# Patient Record
Sex: Female | Born: 1950
Health system: Southern US, Community
[De-identification: ages and names within clinical notes are randomized; demographics above are authoritative.]

## PROBLEM LIST (undated history)

## (undated) DIAGNOSIS — K219 Gastro-esophageal reflux disease without esophagitis: Secondary | ICD-10-CM

## (undated) DIAGNOSIS — G4730 Sleep apnea, unspecified: Secondary | ICD-10-CM

## (undated) DIAGNOSIS — I10 Essential (primary) hypertension: Secondary | ICD-10-CM

## (undated) HISTORY — DX: Essential (primary) hypertension: I10

## (undated) HISTORY — PX: BREAST BIOPSY: SHX20

---

## 1998-03-20 ENCOUNTER — Encounter: Admission: RE | Admit: 1998-03-20 | Discharge: 1998-03-20 | Payer: Self-pay | Admitting: Family Medicine

## 1999-11-18 ENCOUNTER — Encounter: Payer: Self-pay | Admitting: *Deleted

## 1999-11-18 ENCOUNTER — Encounter: Admission: RE | Admit: 1999-11-18 | Discharge: 1999-11-18 | Payer: Self-pay | Admitting: Sports Medicine

## 1999-12-10 ENCOUNTER — Other Ambulatory Visit: Admission: RE | Admit: 1999-12-10 | Discharge: 1999-12-10 | Payer: Self-pay | Admitting: General Surgery

## 2000-01-12 ENCOUNTER — Ambulatory Visit (HOSPITAL_BASED_OUTPATIENT_CLINIC_OR_DEPARTMENT_OTHER): Admission: RE | Admit: 2000-01-12 | Discharge: 2000-01-12 | Payer: Self-pay | Admitting: General Surgery

## 2000-01-12 ENCOUNTER — Encounter (INDEPENDENT_AMBULATORY_CARE_PROVIDER_SITE_OTHER): Payer: Self-pay | Admitting: *Deleted

## 2003-01-22 ENCOUNTER — Encounter: Payer: Self-pay | Admitting: Cardiovascular Disease

## 2003-01-22 ENCOUNTER — Inpatient Hospital Stay (HOSPITAL_COMMUNITY): Admission: EM | Admit: 2003-01-22 | Discharge: 2003-01-23 | Payer: Self-pay | Admitting: Nurse Practitioner

## 2004-09-16 ENCOUNTER — Encounter: Admission: RE | Admit: 2004-09-16 | Discharge: 2004-09-16 | Payer: Self-pay | Admitting: Allergy and Immunology

## 2005-03-22 ENCOUNTER — Other Ambulatory Visit: Admission: RE | Admit: 2005-03-22 | Discharge: 2005-03-22 | Payer: Self-pay | Admitting: Obstetrics and Gynecology

## 2008-05-30 ENCOUNTER — Observation Stay (HOSPITAL_COMMUNITY): Admission: EM | Admit: 2008-05-30 | Discharge: 2008-05-31 | Payer: Self-pay | Admitting: Emergency Medicine

## 2008-05-30 ENCOUNTER — Ambulatory Visit: Payer: Self-pay | Admitting: Cardiology

## 2008-05-30 DIAGNOSIS — Z8679 Personal history of other diseases of the circulatory system: Secondary | ICD-10-CM | POA: Insufficient documentation

## 2008-05-31 ENCOUNTER — Encounter (INDEPENDENT_AMBULATORY_CARE_PROVIDER_SITE_OTHER): Payer: Self-pay | Admitting: Internal Medicine

## 2008-06-05 ENCOUNTER — Encounter (INDEPENDENT_AMBULATORY_CARE_PROVIDER_SITE_OTHER): Payer: Self-pay | Admitting: Internal Medicine

## 2008-06-05 DIAGNOSIS — Z87448 Personal history of other diseases of urinary system: Secondary | ICD-10-CM | POA: Insufficient documentation

## 2008-06-05 DIAGNOSIS — K219 Gastro-esophageal reflux disease without esophagitis: Secondary | ICD-10-CM

## 2010-12-11 ENCOUNTER — Inpatient Hospital Stay (HOSPITAL_COMMUNITY)
Admission: EM | Admit: 2010-12-11 | Discharge: 2010-12-16 | Payer: Self-pay | Source: Home / Self Care | Attending: Internal Medicine | Admitting: Internal Medicine

## 2010-12-14 DIAGNOSIS — F329 Major depressive disorder, single episode, unspecified: Secondary | ICD-10-CM

## 2010-12-14 DIAGNOSIS — F3289 Other specified depressive episodes: Secondary | ICD-10-CM

## 2010-12-21 LAB — URINALYSIS, ROUTINE W REFLEX MICROSCOPIC
Ketones, ur: NEGATIVE mg/dL
Leukocytes, UA: NEGATIVE
Nitrite: NEGATIVE
Protein, ur: 30 mg/dL — AB
Specific Gravity, Urine: 1.02 (ref 1.005–1.030)
Urine Glucose, Fasting: NEGATIVE mg/dL
Urobilinogen, UA: 1 mg/dL (ref 0.0–1.0)
pH: 6 (ref 5.0–8.0)

## 2010-12-21 LAB — CBC
HCT: 43.9 % (ref 36.0–46.0)
HCT: 40.9 % (ref 36.0–46.0)
HCT: 41.5 % (ref 36.0–46.0)
HCT: 41.6 % (ref 36.0–46.0)
HCT: 43.6 % (ref 36.0–46.0)
Hemoglobin: 14.6 g/dL (ref 12.0–15.0)
Hemoglobin: 13.3 g/dL (ref 12.0–15.0)
Hemoglobin: 13.5 g/dL (ref 12.0–15.0)
Hemoglobin: 13.2 g/dL (ref 12.0–15.0)
Hemoglobin: 14.2 g/dL (ref 12.0–15.0)
MCH: 30.9 pg (ref 26.0–34.0)
MCH: 30.4 pg (ref 26.0–34.0)
MCH: 30.3 pg (ref 26.0–34.0)
MCH: 29.7 pg (ref 26.0–34.0)
MCH: 30.5 pg (ref 26.0–34.0)
MCHC: 33.3 g/dL (ref 30.0–36.0)
MCHC: 32.5 g/dL (ref 30.0–36.0)
MCHC: 32.5 g/dL (ref 30.0–36.0)
MCHC: 31.7 g/dL (ref 30.0–36.0)
MCHC: 32.6 g/dL (ref 30.0–36.0)
MCV: 93 fL (ref 78.0–100.0)
MCV: 93.4 fL (ref 78.0–100.0)
MCV: 93 fL (ref 78.0–100.0)
MCV: 93.5 fL (ref 78.0–100.0)
MCV: 93.8 fL (ref 78.0–100.0)
Platelets: 242 10*3/uL (ref 150–400)
Platelets: 230 10*3/uL (ref 150–400)
Platelets: 272 10*3/uL (ref 150–400)
Platelets: 287 10*3/uL (ref 150–400)
Platelets: 321 10*3/uL (ref 150–400)
RBC: 4.72 MIL/uL (ref 3.87–5.11)
RBC: 4.38 MIL/uL (ref 3.87–5.11)
RBC: 4.46 MIL/uL (ref 3.87–5.11)
RBC: 4.45 MIL/uL (ref 3.87–5.11)
RBC: 4.65 MIL/uL (ref 3.87–5.11)
RDW: 13.9 % (ref 11.5–15.5)
RDW: 14 % (ref 11.5–15.5)
RDW: 13.7 % (ref 11.5–15.5)
RDW: 13.9 % (ref 11.5–15.5)
RDW: 13.6 % (ref 11.5–15.5)
WBC: 13.5 10*3/uL — ABNORMAL HIGH (ref 4.0–10.5)
WBC: 9.9 10*3/uL (ref 4.0–10.5)
WBC: 13.5 10*3/uL — ABNORMAL HIGH (ref 4.0–10.5)
WBC: 15 10*3/uL — ABNORMAL HIGH (ref 4.0–10.5)
WBC: 18.1 10*3/uL — ABNORMAL HIGH (ref 4.0–10.5)

## 2010-12-21 LAB — BASIC METABOLIC PANEL
BUN: 13 mg/dL (ref 6–23)
CO2: 30 mEq/L (ref 19–32)
Calcium: 8.7 mg/dL (ref 8.4–10.5)
Chloride: 98 mEq/L (ref 96–112)
Creatinine, Ser: 0.85 mg/dL (ref 0.4–1.2)
GFR calc Af Amer: >60 mL/min (ref >60)
GFR calc non Af Amer: >60 mL/min (ref >60)
Glucose, Bld: 128 mg/dL — ABNORMAL HIGH (ref 70–99)
Potassium: 4 mEq/L (ref 3.5–5.1)
Sodium: 140 mEq/L (ref 135–145)

## 2010-12-21 LAB — CK TOTAL AND CKMB (NOT AT ARMC)
CK, MB: 3.8 ng/mL (ref 0.3–4.0)
Relative Index: 0.7 (ref 0.0–2.5)
Total CK: 551 U/L — ABNORMAL HIGH (ref 7–177)

## 2010-12-21 LAB — URINE CULTURE
Colony Count: >=100000
Colony Count: NO GROWTH
Culture  Setup Time: 201201071117
Culture  Setup Time: 201201090012
Culture: NO GROWTH

## 2010-12-21 LAB — CARDIAC PANEL(CRET KIN+CKTOT+MB+TROPI)
CK, MB: 4.4 ng/mL — ABNORMAL HIGH (ref 0.3–4.0)
CK, MB: 3.9 ng/mL (ref 0.3–4.0)
CK, MB: 4.2 ng/mL — ABNORMAL HIGH (ref 0.3–4.0)
CK, MB: 4.8 ng/mL — ABNORMAL HIGH (ref 0.3–4.0)
CK, MB: 4.6 ng/mL — ABNORMAL HIGH (ref 0.3–4.0)
CK, MB: 4.8 ng/mL — ABNORMAL HIGH (ref 0.3–4.0)
CK, MB: 5 ng/mL — ABNORMAL HIGH (ref 0.3–4.0)
CK, MB: 4.3 ng/mL — ABNORMAL HIGH (ref 0.3–4.0)
Relative Index: 0.7 (ref 0.0–2.5)
Relative Index: 0.7 (ref 0.0–2.5)
Relative Index: 1 (ref 0.0–2.5)
Relative Index: 1.4 (ref 0.0–2.5)
Relative Index: 1.5 (ref 0.0–2.5)
Relative Index: 2 (ref 0.0–2.5)
Relative Index: 2.3 (ref 0.0–2.5)
Relative Index: 2.7 — ABNORMAL HIGH (ref 0.0–2.5)
Total CK: 632 U/L — ABNORMAL HIGH (ref 7–177)
Total CK: 541 U/L — ABNORMAL HIGH (ref 7–177)
Total CK: 440 U/L — ABNORMAL HIGH (ref 7–177)
Total CK: 351 U/L — ABNORMAL HIGH (ref 7–177)
Total CK: 307 U/L — ABNORMAL HIGH (ref 7–177)
Total CK: 236 U/L — ABNORMAL HIGH (ref 7–177)
Total CK: 218 U/L — ABNORMAL HIGH (ref 7–177)
Total CK: 159 U/L (ref 7–177)
Troponin I: 0.03 ng/mL (ref 0.00–0.06)
Troponin I: 0.01 ng/mL (ref 0.00–0.06)
Troponin I: 0.02 ng/mL (ref 0.00–0.06)
Troponin I: 0.02 ng/mL (ref 0.00–0.06)
Troponin I: 0.02 ng/mL (ref 0.00–0.06)
Troponin I: 0.03 ng/mL (ref 0.00–0.06)
Troponin I: 0.02 ng/mL (ref 0.00–0.06)
Troponin I: 0.01 ng/mL (ref 0.00–0.06)

## 2010-12-21 LAB — POCT I-STAT, CHEM 8
BUN: 16 mg/dL (ref 6–23)
Calcium, Ion: 0.86 mmol/L — ABNORMAL LOW (ref 1.12–1.32)
Chloride: 104 mEq/L (ref 96–112)
Creatinine, Ser: 1.4 mg/dL — ABNORMAL HIGH (ref 0.4–1.2)
Glucose, Bld: 118 mg/dL — ABNORMAL HIGH (ref 70–99)
HCT: 47 % — ABNORMAL HIGH (ref 36.0–46.0)
Hemoglobin: 16 g/dL — ABNORMAL HIGH (ref 12.0–15.0)
Potassium: 3.7 mEq/L (ref 3.5–5.1)
Sodium: 137 mEq/L (ref 135–145)
TCO2: 27 mmol/L (ref 0–100)

## 2010-12-21 LAB — COMPREHENSIVE METABOLIC PANEL
ALT: 52 U/L — ABNORMAL HIGH (ref 0–35)
AST: 45 U/L — ABNORMAL HIGH (ref 0–37)
Albumin: 3.3 g/dL — ABNORMAL LOW (ref 3.5–5.2)
Alkaline Phosphatase: 50 U/L (ref 39–117)
BUN: 12 mg/dL (ref 6–23)
CO2: 27 mEq/L (ref 19–32)
Calcium: 8.8 mg/dL (ref 8.4–10.5)
Chloride: 102 mEq/L (ref 96–112)
Creatinine, Ser: 0.91 mg/dL (ref 0.4–1.2)
GFR calc Af Amer: >60 mL/min (ref >60)
GFR calc non Af Amer: >60 mL/min (ref >60)
Glucose, Bld: 147 mg/dL — ABNORMAL HIGH (ref 70–99)
Potassium: 3.7 mEq/L (ref 3.5–5.1)
Sodium: 140 mEq/L (ref 135–145)
Total Bilirubin: 0.8 mg/dL (ref 0.3–1.2)
Total Protein: 7.5 g/dL (ref 6.0–8.3)

## 2010-12-21 LAB — TSH
TSH: 6.279 u[IU]/mL — ABNORMAL HIGH (ref 0.350–4.500)
TSH: 0.457 u[IU]/mL (ref 0.350–4.500)

## 2010-12-21 LAB — DIFFERENTIAL
Basophils Absolute: 0 10*3/uL (ref 0.0–0.1)
Basophils Absolute: 0 10*3/uL (ref 0.0–0.1)
Basophils Relative: 0 % (ref 0–1)
Basophils Relative: 0 % (ref 0–1)
Eosinophils Absolute: 0 10*3/uL (ref 0.0–0.7)
Eosinophils Absolute: 0 10*3/uL (ref 0.0–0.7)
Eosinophils Relative: 0 % (ref 0–5)
Eosinophils Relative: 0 % (ref 0–5)
Lymphocytes Relative: 15 % (ref 12–46)
Lymphocytes Relative: 16 % (ref 12–46)
Lymphs Abs: 2 10*3/uL (ref 0.7–4.0)
Lymphs Abs: 1.6 10*3/uL (ref 0.7–4.0)
Monocytes Absolute: 1.3 10*3/uL — ABNORMAL HIGH (ref 0.1–1.0)
Monocytes Absolute: 1 10*3/uL (ref 0.1–1.0)
Monocytes Relative: 10 % (ref 3–12)
Monocytes Relative: 10 % (ref 3–12)
Neutro Abs: 10.1 10*3/uL — ABNORMAL HIGH (ref 1.7–7.7)
Neutro Abs: 7.3 10*3/uL (ref 1.7–7.7)
Neutrophils Relative %: 75 % (ref 43–77)
Neutrophils Relative %: 74 % (ref 43–77)

## 2010-12-21 LAB — LIPID PANEL
Cholesterol: 198 mg/dL (ref 0–200)
HDL: 41 mg/dL (ref >39)
LDL Cholesterol: 137 mg/dL — ABNORMAL HIGH (ref 0–99)
Total CHOL/HDL Ratio: 4.8 RATIO
Triglycerides: 98 mg/dL (ref <150)
VLDL: 20 mg/dL (ref 0–40)

## 2010-12-21 LAB — URINE MICROSCOPIC-ADD ON

## 2010-12-21 LAB — CULTURE, BLOOD (ROUTINE X 2)
Culture  Setup Time: 201201071112
Culture  Setup Time: 201201071114
Culture: NO GROWTH
Culture: NO GROWTH

## 2010-12-21 LAB — TROPONIN I: Troponin I: 0.03 ng/mL (ref 0.00–0.06)

## 2010-12-27 ENCOUNTER — Encounter: Payer: Self-pay | Admitting: Obstetrics and Gynecology

## 2011-03-19 ENCOUNTER — Other Ambulatory Visit (HOSPITAL_COMMUNITY): Payer: Self-pay | Admitting: Family Medicine

## 2011-03-19 ENCOUNTER — Ambulatory Visit (HOSPITAL_COMMUNITY)
Admission: RE | Admit: 2011-03-19 | Discharge: 2011-03-19 | Disposition: A | Discharge status: Home / Self Care | Payer: Medicaid Other | Source: Ambulatory Visit | Attending: Family Medicine | Admitting: Family Medicine

## 2011-03-19 DIAGNOSIS — R059 Cough, unspecified: Secondary | ICD-10-CM | POA: Insufficient documentation

## 2011-03-19 DIAGNOSIS — R0602 Shortness of breath: Secondary | ICD-10-CM

## 2011-03-19 DIAGNOSIS — R05 Cough: Secondary | ICD-10-CM | POA: Insufficient documentation

## 2011-03-19 DIAGNOSIS — I10 Essential (primary) hypertension: Secondary | ICD-10-CM | POA: Insufficient documentation

## 2011-04-20 NOTE — Discharge Summary (Signed)
Pamela Soto, Pamela Soto                ACCOUNT NO.:  [id number]   MEDICAL RECORD NO.:  [id number]          PATIENT TYPE:  OBV   LOCATION:  4703                         FACILITY:  [facility]   PHYSICIAN:  Mick Sell, MD DATE OF BIRTH:  Aug 21, 1951   DATE OF ADMISSION:  05/30/2008  DATE OF DISCHARGE:  05/31/2008                               DISCHARGE SUMMARY   DISCHARGE DIAGNOSES:  1. Chest pain with typical and atypical features ruled out for      myocardial infarction by negative cardiac enzymes and unchanged      EKGs, but with mildly abnormal 2-D echo (normal cardiac cath with      Dr. Jacinto Halim in 2004).  2. History of gastroesophageal reflux disease.  3. History of breast mass excised in 2001 (benign).  4. Urinary tract infection.  5. Trichomoniasis.   MEDICATIONS AT DISCHARGE:  1. Aspirin 325 mg p.o. daily x1 week and then 81 mg p.o. q. daily.  2. Nexium 20 mg p.o. b.i.d.  3. Ciprofloxacin 250 mg p.o. b.i.d. for 3 days.  4. Pravastatin 40 mg p.o. daily.  5. Hydrochlorothiazide 25 mg p.o. q. daily.   The patient will have an appointment with Dr. Jacinto Halim and he will call her  with the appointment date and time, and she will also have an  appointment with the outpatient clinic and the clinic will call her with  the appointment date. The [facility] appt could not be scheduled as the clinic  was closed on the D/C date.   She was advised to follow a low-sodium, heart-healthy diet.   CONDITION ON DISCHARGE:  Improved.  No chest pain.   PROCEDURES:  The patient had a chest x-ray on admission showing no  active disease, aorta mildly tortuous.  She had a 2-D echo showing question of hypokinesia at either the base of  the inferior wall or the base of the inferior septum.  Overall, left  ventricular systolic function normal with an EF of 55%-60%, left  ventricular wall thickness mildly increased.   There were no consultants.   HISTORY OF PRESENT ILLNESS:  This is a 60 year old African  American  woman with past medical history of GERD and noncardiac chest pain, with  a normal cath in 2004, presenting with a 2-day history of pressure-like  substernal chest pain, 9/10, nonradiating, associated with diaphoresis  and shortness of breath, but no nausea, vomiting, dizziness, or  palpitations.  She says pain partially relieved by Nexium, but this does  not feel like her regular GERD.  No relationship with food.  Pain better  when changes position, worsened by deep inspiration.  The patient  noticed increased blood pressure over the last days, 180-198/90-98.  The  pain started insidiously and is continuous.  It woke her up from sleep  the day prior to admission.  On the day of admission it was worse,  prompting her to come to the ED.  She mentions that she had this pain  for several months, on and off.  Normally, it appears on exertion and is  associated with shortness of breath and  relieved by Nexium.   She has no known drug allergies.   PHYSICAL EXAMINATION:  VITAL SIGNS:  Temperature 97.1, blood pressure  155/85, pulse 92 respiration 20, and oxygen saturation 96% on room air.  GENERAL:  She was in no acute distress.  Obese.  Dark complexion.  Chest  pain was 3/6 at the time of this exam.  EYES:  PERRL.  Extraocular movements intact.  ENT:  Clear oropharynx membranes.  Mallampati 4.  NECK:  Obese.  No thyromegaly.  No bruits.  RESPIRATORY:  Clear to auscultation bilaterally.  Good air movement.  CARDIOVASCULAR:  Regular rate and rhythm with the S2 more intense than  S1.  No murmurs, rubs, or gallops.  GI:  Soft and nontender to palpation in the suprapubic area,  nondistended.  Bowel sounds positive.  Negative Murphy.  EXTREMITIES:  No edema.  GU:  No CVA tenderness.  SKIN:  Moist.  MENTAL STATUS:  Alert and oriented x3.  NEURO:  Nonfocal.  PSYCH:  Appropriate.   LABS:  First set of point of care cardiac enzymes normal, except for a  troponin of 0.13; however, the  next set of point of care cardiac enzymes  showed a troponin of less than 0.05 and 3 subsequent regular sets were  normal.  EKG was completely normal.  White blood cells 7.2, hemoglobin  14.2, hematocrit 41.8, MCV 90.8, platelets 242,000, and neutrophil 72%.  i-STAT, sodium 140, potassium 3.6, CO2 31, chloride 104, glucose 115,  BUN 8, and creatinine 1.0.  Coags were normal.  D-dimer was normal at  0.31.  Lipase 27 and magnesium 2.2.  Creatinine on the CMET was 0.78.  All the liver function tests were normal.  ESR was 15.  Urinalysis  showed cloudy urine with large leukocytes.  Urine micro showed 11-20  white blood cells, 3-6 red blood cells, many bacteria, and few  Trichomonas.  Hemoglobin A1c was 6.0.  TSH was 3.171, free T4 was 1.02,  and C-reactive protein 1.8 mildly elevated.   ASSESSMENT AND PLAN:  This is a 60 year old African American woman with  several cardiac risk factors: hypertension, hyperlipidemia, history of  smoking, and questionable family history of myocardial infarction (in 22-  year-old in mother, during labor), presenting with chest pain with both  typical and atypical features.  1. Chest pain.      a.     This is concerning for a cardiac event in this obese woman       with the cardiac risk factors mentioned above.  She had a clean       cath in 2004 with Dr. Jacinto Halim, but mentions that this pain is       different.  Cardiac enzymes were negative, and 2 EKGs were       completely normal.  The 2-D echo showed some mild abnormality of       septal movement.  These 2-D echo changes were discussed with Dr.       Jacinto Halim who considered this to be very mild and nonspecific;       however, he would like to see the patient in outpatient for a       possible Myoview.      b.     We also considered possible diagnosis of pulmonary embolism       as the patient has a history of flight; however, this flight was       short and it was 2 months ago so unlikely to  be causing  symptoms       at this time.  She had no shortness of breath, no sustained       tachycardia, and actually a D-dimer was negative.      c.     We also considered pneumonia, but she had no symptoms, no       white blood cells, and a chest x-ray was clean.      d.     Pneumothorax and aortic dissection were practically excluded       based on the chest x-ray.      e.     Peptic ulcer disease was considered, too.  We ordered stool       H. pylori antigen on admission.  Gastritis is possible as well, in       the context of her using acetylsalicylic acid.  We also intended       to check a stool Hemoccult; however, neither of these 2 last tests       were able to be done since she did not provide a stool sample, but       this is a consideration for the future.      f.     We also considered gallbladder stone/inflammation; however,       the liver function tests were normal and she has no nausea or       vomiting or relationship of chest pain with food.  She had a       negative Murphy sign also.      g.     Musculoskeletal or thoracic spine nerve compression was also       possible.  An erythrocyte sedimentation rate was normal and her       chest pain was not reproduced by palpation.  At this point, we believe that while her heart could have contributed to  this chest pain, another etiology is still possible likely  gastrointestinal secondary to gastroesophageal reflux disease.  We  increased her proton pump inhibitor on discharge to b.i.d.  We monitored  her on telemetry, checked TSH, fasting lipid profile, and hemoglobin  A1c.  We gave her aspirin, nitroglycerin, Lopressor, and Crestor in the  hospital.  1. Daily morning headaches.  The patient also has Mallampati 4 and      mentions snoring and nocturnal apneic episodes that wake her up      from sleep.  She will absolutely need a sleep study in outpatient.      We offered her CPAP in the hospital.  2. Hypertension.  The patient will  need to be on a blood pressure      medication.  We started her on hydrochlorothiazide in the hospital      and her blood pressure was completed normal afterwards.  I doubt      that this is an effect of hydrochlorothiazide because her blood      pressure normalized immediately after admission, so HCTZ did not      have time to act yet.  3. Urinary tract infection.  The patient had pyuria and bacteriuria.      We started her on ciprofloxacin for 3 days as she was symptomatic      with suprapubic pain.  4. Gastroesophageal reflux disease.  We offered her Protonix and      Carafate and we will discharge her with b.i.d. Nexium.  5. Prophylaxis for deep vein thrombosis.  Lovenox for gastrointestinal  Protonix.   VITAL SIGNS ON DISCHARGE:  Temperature 97.3, pulse 76, respirations 20,  blood pressure 110/74, and oxygen saturation 93% on room air.   LABS:  Please see previous labs as the patient was discharged in almost  24 hours since admission.      Carlus Pavlov, M.D.  Electronically Signed      Mick Sell, MD  Electronically Signed    CG/MEDQ  D:  05/31/2008  T:  06/01/2008  Job:  [id number]   cc:   Cristy Hilts. Jacinto Halim, MD

## 2011-04-23 NOTE — Cardiovascular Report (Signed)
NAME:  Pamela Soto, Pamela Soto                          ACCOUNT NO.:  [id number]   MEDICAL RECORD NO.:  [id number]                   PATIENT TYPE:  INP   LOCATION:  3704                                 FACILITY:  [facility]   PHYSICIAN:  Cristy Hilts. Jacinto Halim, M.D.                  DATE OF BIRTH:  07-Jun-1951   DATE OF PROCEDURE:  01/23/2003  DATE OF DISCHARGE:                              CARDIAC CATHETERIZATION   PROCEDURE PERFORMED:  Left heart catheterization, including:  1. Left ventriculography.  2. Selective right and left coronary arteriography.  3. Selective renal arteriography.  4. Closure of right femoral arterial access with Perclose.   INDICATIONS FOR PROCEDURE:  The patient is a 60 year old African-American  female with no significant past medical history from a cardiovascular  standpoint.  Presented to the emergency room with chest pain which was  worrisome for angina.  She also had nonspecific ST-T wave changes.  She is  also obese.  Due to this, she was brought to the cardiac catheterization lab  to evaluate her coronary anatomy.   HEMODYNAMIC DATA:  1. The left ventricular pressure was 139/16 with an end diastolic pressure     of 21 mmHg.  2. The aortic pressure was 144/86 with a mean of 109 mmHg.  3. There was no pressure gradient across the aortic valve.   ANGIOGRAPHIC DATA:  1. Left ventricle:  The left ventricular systolic function was normal.  The     ejection fraction was estimated at 60%.  There is no significant mitral     regurgitation.  2. Right coronary artery:  The right coronary artery is a large caliber     vessel and is dominant.  The vessel is normal.  3. Left main coronary artery:  The left main coronary artery is a large     caliber vessel and is normal.  4. Circumflex coronary artery:  The circumflex coronary artery is a large     caliber vessel.  It is normal.  It gives origin to a large obtuse     marginal 1.  5. Left anterior descending coronary artery:  The  LAD is a large caliber     vessel.  It gives origin to a large diagonal 1 and a moderate-sized     diagonal 2, and the LAD itself ends at the apex.  It is normal.  6. Selective renal arteriography:  Selective renal arteriography revealed 1     renal artery on either side.  Both were normal.   IMPRESSION:  1. Normal left ventricular systolic function.  Normal coronary arteries.  2. Widely patent renal arteries.   RECOMMENDATIONS:  1. Based on her coronary anatomy, noncardiac cause of chest pain evaluation     is indicated.  The patient will be started on therapy for gastric reflux.     Aggressive risk factor modification is again indicated, especially  control of obesity.  Will also check a lipid status.  2. When reflux therapy continues for a period of 4 weeks and if she     continues to have recurrent chest pain, further evaluation may be     indicated.   TECHNIQUE OF PROCEDURE:  Under usual sterile precautions, using 6-French  right femoral arterial access, a 6-French multipurpose B2 catheter was  advanced into the ascending aorta over a 0.035-inch J wire.  The catheter  was gently advanced to the left ventricle.  Left ventricular pressures were  monitored.  Hand contrast injection of the left ventricle was performed,  both in LAO and RAO projections.  The catheter was flushed with saline,  pulled back into the ascending aorta, and pressure gradient across the  aortic valve was monitored.  The right coronary artery was selectively  engaged, and angiography was performed.  In a similar fashion, the left main  coronary artery was selectively engaged, and angiography was performed.  Then the catheter was pulled back into the abdominal aorta, and selective  right and left renal arteriography was performed.  Then the catheter was  pulled out of the body.   Right femoral angiography was performed through the arterial access, and the  arterial access was closed with Perclose.   Adequate hemostasis was obtained.  The patient was transferred to the recovery room in stable condition.                                               Cristy Hilts. Jacinto Halim, M.D.    Pilar Plate  D:  01/23/2003  T:  01/23/2003  Job:  [id number]   cc:   Annie Paras, M.D.  [facility]   [facility]

## 2011-04-23 NOTE — Op Note (Signed)
[place]  Patient:    Pamela Soto                        MRN: [id number] Proc. Date: 01/12/00 Adm. Date:  [id number] Attending:  Arlis Porta                           Operative Report  PREOPERATIVE DIAGNOSIS:  Right breast mass.  POSTOPERATIVE DIAGNOSIS:  Right breast mass.  PROCEDURE:  Excisional biopsy of right breast mass.  SURGEON:  Adolph Pollack, M.D.  ASSISTANT:  None.  ANESTHESIA:  Local (1% lidocaine with epinephrine, plus 0.5% plain Marcaine, plus              sodium bicarbonate) with MAC.  INDICATIONS:  This 60 year old female has a palpable breast mass that is a BiRADs IV lesion by mammography and ultrasound.  Fine-needle aspiration did not  show malignant cells.  She now presents for excision.  TECHNIQUE:  She is placed supine on the operating table and the mass was marked. She was then given intravenous sedation.  The right breast was sterilely prepped and draped.  Local anesthetic was infiltrated in the circumference of areolar area and the lower outer quadrant.  The circumferential areolar incision was made in the lower outer quadrant, incising the skin and dermis sharply.  Flaps were raised, the mass was palpated and grasped with the Allis forceps.  The mass was excised sharply and sent fresh to pathology.  Next, the wound was inspected for bleeding, and hemostasis was obtained using electrocautery and some interrupted 3-0 Vicryl figure-of-eight suture ligatures. Once hemostasis was adequate, the subcutaneous fat was loosely approximated with interrupted 3-0 Vicryl sutures.  The skin was closed with a running 4-0 monocryl subcuticular stitch.  Steri-Strips and a sterile dressing were applied.  She tolerated the procedure well without any apparent complications.  She was taken to the recovery room in satisfactory condition. DD:  01/12/00 TD:  01/12/00 Job: [id number]

## 2011-09-02 LAB — COMPREHENSIVE METABOLIC PANEL
ALT: 30
AST: 33
Albumin: 3.8
Alkaline Phosphatase: 52
BUN: 6
CO2: 29
Calcium: 8.4
Chloride: 102
Creatinine, Ser: 0.78
GFR calc Af Amer: >60
GFR calc non Af Amer: >60
Glucose, Bld: 95
Potassium: 4
Sodium: 139
Total Bilirubin: 1.1
Total Protein: 7.3

## 2011-09-02 LAB — POCT I-STAT, CHEM 8
BUN: 8
Calcium, Ion: 1.13
Chloride: 104
Creatinine, Ser: 1
Glucose, Bld: 115 — ABNORMAL HIGH
HCT: 43
Hemoglobin: 14.6
Potassium: 3.6
Sodium: 140
TCO2: 31

## 2011-09-02 LAB — LIPASE, BLOOD: Lipase: 27

## 2011-09-02 LAB — LIPID PANEL
HDL: 31 — ABNORMAL LOW
Triglycerides: 159 — ABNORMAL HIGH
VLDL: 32

## 2011-09-02 LAB — DIFFERENTIAL
Basophils Absolute: 0
Basophils Relative: 0
Eosinophils Absolute: 0.1
Eosinophils Relative: 1
Lymphocytes Relative: 22
Lymphs Abs: 1.6
Monocytes Absolute: 0.3
Monocytes Relative: 5
Neutro Abs: 5.2
Neutrophils Relative %: 72

## 2011-09-02 LAB — CBC
HCT: 39.8
HCT: 41.8
Hemoglobin: 13.6
Hemoglobin: 14.2
MCHC: 33.9
MCV: 90.6
MCV: 90.8
Platelets: 242
RBC: 4.6
RDW: 13.7
WBC: 7.2
WBC: 7.4

## 2011-09-02 LAB — BASIC METABOLIC PANEL
BUN: 10
Creatinine, Ser: 0.83
GFR calc non Af Amer: >60
Glucose, Bld: 119 — ABNORMAL HIGH
Potassium: 4

## 2011-09-02 LAB — CARDIAC PANEL(CRET KIN+CKTOT+MB+TROPI)
CK, MB: 0.9
CK, MB: 1
Relative Index: INVALID
Troponin I: 0.01
Troponin I: <0.01

## 2011-09-02 LAB — URINE MICROSCOPIC-ADD ON

## 2011-09-02 LAB — HEMOGLOBIN A1C
Hgb A1c MFr Bld: 6
Mean Plasma Glucose: 136

## 2011-09-02 LAB — TSH: TSH: 3.171

## 2011-09-02 LAB — URINE CULTURE

## 2011-09-02 LAB — POCT CARDIAC MARKERS
CKMB, poc: 1.2
CKMB, poc: 1.6
Myoglobin, poc: 64.3
Myoglobin, poc: 79.1
Operator id: 146091
Operator id: 146091
Troponin i, poc: 0.13 — ABNORMAL HIGH
Troponin i, poc: <0.05

## 2011-09-02 LAB — URINALYSIS, ROUTINE W REFLEX MICROSCOPIC
Bilirubin Urine: NEGATIVE
Nitrite: NEGATIVE
Specific Gravity, Urine: 1.016
Urobilinogen, UA: 1
pH: 8

## 2011-09-02 LAB — CK TOTAL AND CKMB (NOT AT ARMC)
CK, MB: 1.1
Relative Index: INVALID
Total CK: 41

## 2011-09-02 LAB — SEDIMENTATION RATE: Sed Rate: 15

## 2011-09-02 LAB — C-REACTIVE PROTEIN: CRP: 1.8 — ABNORMAL HIGH (ref <0.6)

## 2011-09-02 LAB — TROPONIN I: Troponin I: 0.02

## 2011-09-02 LAB — T4, FREE: Free T4: 1.02

## 2011-09-02 LAB — PROTIME-INR: Prothrombin Time: 13

## 2011-09-02 LAB — D-DIMER, QUANTITATIVE: D-Dimer, Quant: 0.31

## 2011-09-02 LAB — MAGNESIUM: Magnesium: 2.2

## 2011-09-22 ENCOUNTER — Other Ambulatory Visit (HOSPITAL_COMMUNITY): Payer: Self-pay | Admitting: Family Medicine

## 2011-09-22 DIAGNOSIS — Z1231 Encounter for screening mammogram for malignant neoplasm of breast: Secondary | ICD-10-CM

## 2011-10-14 ENCOUNTER — Ambulatory Visit (HOSPITAL_COMMUNITY)
Admission: RE | Admit: 2011-10-14 | Discharge: 2011-10-14 | Disposition: A | Discharge status: Home / Self Care | Payer: Medicaid Other | Source: Ambulatory Visit | Attending: Family Medicine | Admitting: Family Medicine

## 2011-10-14 DIAGNOSIS — Z1231 Encounter for screening mammogram for malignant neoplasm of breast: Secondary | ICD-10-CM

## 2011-10-29 ENCOUNTER — Other Ambulatory Visit: Payer: Self-pay | Admitting: Family Medicine

## 2011-10-29 DIAGNOSIS — R928 Other abnormal and inconclusive findings on diagnostic imaging of breast: Secondary | ICD-10-CM

## 2011-11-11 ENCOUNTER — Other Ambulatory Visit: Payer: Medicaid Other

## 2011-12-01 ENCOUNTER — Other Ambulatory Visit: Payer: Medicaid Other

## 2011-12-09 ENCOUNTER — Ambulatory Visit
Admission: RE | Admit: 2011-12-09 | Discharge: 2011-12-09 | Disposition: A | Discharge status: Home / Self Care | Payer: Medicaid Other | Source: Ambulatory Visit | Attending: Family Medicine | Admitting: Family Medicine

## 2011-12-09 DIAGNOSIS — R928 Other abnormal and inconclusive findings on diagnostic imaging of breast: Secondary | ICD-10-CM

## 2012-10-13 ENCOUNTER — Emergency Department (HOSPITAL_COMMUNITY): Payer: Self-pay

## 2012-10-13 ENCOUNTER — Emergency Department (HOSPITAL_COMMUNITY)
Admission: EM | Admit: 2012-10-13 | Discharge: 2012-10-14 | Disposition: A | Discharge status: Home / Self Care | Payer: Self-pay | Attending: Emergency Medicine | Admitting: Emergency Medicine

## 2012-10-13 ENCOUNTER — Encounter (HOSPITAL_COMMUNITY): Payer: Self-pay | Admitting: Emergency Medicine

## 2012-10-13 DIAGNOSIS — K219 Gastro-esophageal reflux disease without esophagitis: Secondary | ICD-10-CM | POA: Insufficient documentation

## 2012-10-13 DIAGNOSIS — R1110 Vomiting, unspecified: Secondary | ICD-10-CM | POA: Insufficient documentation

## 2012-10-13 NOTE — ED Notes (Signed)
Patient transported to X-ray 

## 2012-10-13 NOTE — ED Provider Notes (Signed)
History     CSN: [id number]  Arrival date & time 10/13/12  2122   First MD Initiated Contact with Patient 10/13/12 2303      Chief Complaint  Patient presents with  . Chest Pain    (Consider location/radiation/quality/duration/timing/severity/associated sxs/prior treatment) Patient is a 61 y.o. female presenting with chest pain. The history is provided by the patient. No language interpreter was used.  Chest Pain The chest pain began 12 - 24 hours ago. Duration of episode(s) is 13 hours. Chest pain occurs constantly. The chest pain is unchanged. Associated with: none. The severity of the pain is severe. The quality of the pain is described as dull. The pain does not radiate. Exacerbated by: none. Primary symptoms include vomiting. Pertinent negatives for primary symptoms include no fever, no shortness of breath, no cough and no palpitations.  Pertinent negatives for associated symptoms include no claudication, no diaphoresis, no lower extremity edema and no near-syncope. She tried nothing for the symptoms. Risk factors include obesity.  Pertinent negatives for past medical history include no CAD and no valve disorder.  Procedure history is negative for cardiac catheterization.     History reviewed. No pertinent past medical history.  History reviewed. No pertinent past surgical history.  No family history on file.  History  Substance Use Topics  . Smoking status: Never Smoker   . Smokeless tobacco: Not on file  . Alcohol Use: No    OB History    Grav Para Term Preterm Abortions TAB SAB Ect Mult Living                  Review of Systems  Constitutional: Negative for fever and diaphoresis.  HENT: Negative for neck pain.   Respiratory: Negative for cough and shortness of breath.   Cardiovascular: Positive for chest pain. Negative for palpitations, claudication, leg swelling and near-syncope.  Gastrointestinal: Positive for vomiting.  All other systems reviewed and are  negative.    Allergies  Review of patient's allergies indicates no known allergies.  Home Medications   Current Outpatient Rx  Name  Route  Sig  Dispense  Refill  . OMEPRAZOLE 20 MG PO CPDR   Oral   Take 20 mg by mouth daily.           BP 167/71  Pulse 77  Temp 97.6 F (36.4 C) (Oral)  Resp 16  Wt 220 lb (99.791 kg)  SpO2 96%  Physical Exam  Constitutional: She is oriented to person, place, and time. She appears well-developed and well-nourished. No distress.  HENT:  Head: Normocephalic and atraumatic.  Mouth/Throat: Oropharynx is clear and moist.  Eyes: Conjunctivae normal are normal. Pupils are equal, round, and reactive to light.  Neck: Normal range of motion. Neck supple.  Cardiovascular: Normal rate and regular rhythm.   Pulmonary/Chest: Effort normal and breath sounds normal. She has no wheezes. She has no rales.  Abdominal: Soft. Bowel sounds are normal. There is no tenderness. There is no rebound and no guarding.  Musculoskeletal: Normal range of motion.  Neurological: She is alert and oriented to person, place, and time.  Skin: Skin is warm and dry.  Psychiatric: She has a normal mood and affect.    ED Course  Procedures (including critical care time)   Labs Reviewed  CBC  BASIC METABOLIC PANEL  TROPONIN I   Dg Chest 2 View  10/13/2012  *RADIOLOGY REPORT*  Clinical Data: Chest pain, pressure, COPD  CHEST - 2 VIEW  Comparison: 03/19/2011; 12/11/2010;  05/30/2008  Findings:  Grossly unchanged borderline enlarged cardiac silhouette and mediastinal contours with apparent enlargement of the central pulmonary vasculature.  No focal airspace opacities.  No pleural effusion or pneumothorax.  Unchanged bones.  IMPRESSION: Grossly unchanged enlarged cardiac silhouette and mediastinal contours with enlargement of the central pulmonary vasculature, nonspecific but may be seen in the setting of pulmonary arterial hypertension.  Further evaluation with cardiac echo may  be performed as clinically indicated.   Original Report Authenticated By: Tacey Ruiz, MD      No diagnosis found.    MDM   Date: 10/13/2012  Rate: 70  Rhythm: normal sinus rhythm  QRS Axis: normal  Intervals: QT prolonged  ST/T Wave abnormalities: normal  Conduction Disutrbances:none  Narrative Interpretation:   Old EKG Reviewed: unchanged  Symptoms consistent with GERD Symptoms resolved with GI cocktail.  With unchanged EKG and > 8 hours of pain and 2 negative troponins ACS is excluded.  Will restart PPI.  Return immediately for chest pain shortness of breath or any concerns.  Follow up with your family doctor.  Patient verbalizes understanding and agrees to follow up       Freedom Lopezperez Smitty Cords, MD 10/14/12 [phone]

## 2012-10-13 NOTE — ED Notes (Signed)
Pt alert, arrives from home, c/o mid sternal non radiating chest pain, onset was this am, states pain as "heavy", resp even unlabored, skin pwd, pt states emesis this afternoon

## 2012-10-13 NOTE — ED Notes (Signed)
Pt reports chest pain described as pressure that began this am and has gotten progressively worse; pt vomit x 1 prior to arrival to the ED; denies nausea currently; denies radiation of pain.

## 2012-10-14 LAB — BASIC METABOLIC PANEL
CO2: 29 mEq/L (ref 19–32)
Chloride: 97 mEq/L (ref 96–112)
GFR calc Af Amer: >90 mL/min (ref >90)
Potassium: 3.7 mEq/L (ref 3.5–5.1)
Sodium: 135 mEq/L (ref 135–145)

## 2012-10-14 LAB — CBC
Platelets: 249 10*3/uL (ref 150–400)
RBC: 4.62 MIL/uL (ref 3.87–5.11)
RDW: 13 % (ref 11.5–15.5)
WBC: 12 10*3/uL — ABNORMAL HIGH (ref 4.0–10.5)

## 2012-10-14 LAB — POCT I-STAT, CHEM 8
Chloride: 102 mEq/L (ref 96–112)
HCT: 42 % (ref 36.0–46.0)
Hemoglobin: 14.3 g/dL (ref 12.0–15.0)
Potassium: 3.8 mEq/L (ref 3.5–5.1)

## 2012-10-14 LAB — D-DIMER, QUANTITATIVE: D-Dimer, Quant: <0.27 ug/mL-FEU (ref 0.00–0.48)

## 2012-10-14 LAB — POCT I-STAT TROPONIN I
Troponin i, poc: 0 ng/mL (ref 0.00–0.08)
Troponin i, poc: 0 ng/mL (ref 0.00–0.08)

## 2012-10-14 LAB — TROPONIN I: Troponin I: <0.3 ng/mL (ref <0.30)

## 2012-10-14 MED ORDER — ESOMEPRAZOLE MAGNESIUM 40 MG PO CPDR: 40.0000 mg | DELAYED_RELEASE_CAPSULE | Freq: Every day | ORAL | Status: DC

## 2012-10-14 MED ORDER — FAMOTIDINE IN NACL 20-0.9 MG/50ML-% IV SOLN
20.0000 mg | Freq: Once | INTRAVENOUS | Status: AC
  Administered 2012-10-14: 20 mg via INTRAVENOUS
  Filled 2012-10-14: qty 50

## 2012-10-14 MED ORDER — GI COCKTAIL ~~LOC~~
30.0000 mL | Freq: Once | ORAL | Status: AC
  Administered 2012-10-14: 30 mL via ORAL
  Filled 2012-10-14: qty 30

## 2012-11-05 ENCOUNTER — Emergency Department (HOSPITAL_COMMUNITY): Payer: Self-pay

## 2012-11-05 ENCOUNTER — Encounter (HOSPITAL_COMMUNITY): Payer: Self-pay | Admitting: Emergency Medicine

## 2012-11-05 ENCOUNTER — Observation Stay (HOSPITAL_COMMUNITY)
Admission: EM | Admit: 2012-11-05 | Discharge: 2012-11-07 | Disposition: A | Discharge status: Home / Self Care | Payer: MEDICAID | Attending: General Surgery | Admitting: General Surgery

## 2012-11-05 DIAGNOSIS — K429 Umbilical hernia without obstruction or gangrene: Secondary | ICD-10-CM | POA: Insufficient documentation

## 2012-11-05 DIAGNOSIS — K8010 Calculus of gallbladder with chronic cholecystitis without obstruction: Secondary | ICD-10-CM

## 2012-11-05 DIAGNOSIS — K219 Gastro-esophageal reflux disease without esophagitis: Secondary | ICD-10-CM | POA: Insufficient documentation

## 2012-11-05 DIAGNOSIS — K819 Cholecystitis, unspecified: Secondary | ICD-10-CM | POA: Diagnosis present

## 2012-11-05 DIAGNOSIS — Z9049 Acquired absence of other specified parts of digestive tract: Secondary | ICD-10-CM

## 2012-11-05 HISTORY — DX: Gastro-esophageal reflux disease without esophagitis: K21.9

## 2012-11-05 LAB — COMPREHENSIVE METABOLIC PANEL
ALT: 12 U/L (ref 0–35)
AST: 14 U/L (ref 0–37)
Alkaline Phosphatase: 60 U/L (ref 39–117)
BUN: 10 mg/dL (ref 6–23)
Calcium: 9.3 mg/dL (ref 8.4–10.5)
GFR calc non Af Amer: >90 mL/min (ref >90)
Glucose, Bld: 115 mg/dL — ABNORMAL HIGH (ref 70–99)
Potassium: 3.7 mEq/L (ref 3.5–5.1)
Total Bilirubin: 1.2 mg/dL (ref 0.3–1.2)

## 2012-11-05 LAB — CBC WITH DIFFERENTIAL/PLATELET
Basophils Relative: 0 % (ref 0–1)
Eosinophils Absolute: 0 10*3/uL (ref 0.0–0.7)
Eosinophils Relative: 0 % (ref 0–5)
Hemoglobin: 15.4 g/dL — ABNORMAL HIGH (ref 12.0–15.0)
Lymphs Abs: 1.4 10*3/uL (ref 0.7–4.0)
MCH: 30.7 pg (ref 26.0–34.0)
MCHC: 34.7 g/dL (ref 30.0–36.0)
MCV: 88.6 fL (ref 78.0–100.0)
Monocytes Relative: 6 % (ref 3–12)
Neutrophils Relative %: 84 % — ABNORMAL HIGH (ref 43–77)
Platelets: 286 10*3/uL (ref 150–400)
RBC: 5.01 MIL/uL (ref 3.87–5.11)

## 2012-11-05 MED ORDER — HYDROMORPHONE HCL PF 1 MG/ML IJ SOLN
0.5000 mg | Freq: Once | INTRAMUSCULAR | Status: AC
  Administered 2012-11-05: 0.5 mg via INTRAVENOUS
  Filled 2012-11-05: qty 1

## 2012-11-05 MED ORDER — ONDANSETRON HCL 4 MG/2ML IJ SOLN
4.0000 mg | Freq: Four times a day (QID) | INTRAMUSCULAR | Status: DC | PRN
  Administered 2012-11-06: 4 mg via INTRAVENOUS
  Filled 2012-11-05: qty 2

## 2012-11-05 MED ORDER — KCL IN DEXTROSE-NACL 20-5-0.45 MEQ/L-%-% IV SOLN
INTRAVENOUS | Status: DC
  Administered 2012-11-06: 02:00:00 via INTRAVENOUS
  Filled 2012-11-05 (×3): qty 1000

## 2012-11-05 MED ORDER — HEPARIN SODIUM (PORCINE) 5000 UNIT/ML IJ SOLN
5000.0000 [IU] | Freq: Three times a day (TID) | INTRAMUSCULAR | Status: DC
  Administered 2012-11-05 – 2012-11-07 (×5): 5000 [IU] via SUBCUTANEOUS
  Filled 2012-11-05 (×8): qty 1

## 2012-11-05 MED ORDER — SUCRALFATE 1 G PO TABS
1.0000 g | ORAL_TABLET | Freq: Three times a day (TID) | ORAL | Status: DC
  Administered 2012-11-05: 1 g via ORAL
  Filled 2012-11-05 (×2): qty 1

## 2012-11-05 MED ORDER — SODIUM CHLORIDE 0.9 % IV SOLN
INTRAVENOUS | Status: DC
  Administered 2012-11-05: 17:00:00 via INTRAVENOUS

## 2012-11-05 MED ORDER — MORPHINE SULFATE 2 MG/ML IJ SOLN
2.0000 mg | INTRAMUSCULAR | Status: DC | PRN
  Administered 2012-11-05 – 2012-11-06 (×4): 2 mg via INTRAVENOUS
  Filled 2012-11-05 (×4): qty 1

## 2012-11-05 MED ORDER — PANTOPRAZOLE SODIUM 40 MG PO TBEC
80.0000 mg | DELAYED_RELEASE_TABLET | Freq: Every day | ORAL | Status: DC
  Administered 2012-11-05 – 2012-11-07 (×2): 80 mg via ORAL
  Filled 2012-11-05 (×3): qty 2

## 2012-11-05 MED ORDER — LEVOFLOXACIN IN D5W 500 MG/100ML IV SOLN
500.0000 mg | INTRAVENOUS | Status: DC
  Administered 2012-11-05 – 2012-11-06 (×2): 500 mg via INTRAVENOUS
  Filled 2012-11-05 (×3): qty 100

## 2012-11-05 MED ORDER — GI COCKTAIL ~~LOC~~
30.0000 mL | Freq: Once | ORAL | Status: AC
  Administered 2012-11-05: 30 mL via ORAL
  Filled 2012-11-05: qty 30

## 2012-11-05 MED ORDER — ONDANSETRON HCL 4 MG/2ML IJ SOLN
4.0000 mg | Freq: Once | INTRAMUSCULAR | Status: AC
  Administered 2012-11-05: 4 mg via INTRAVENOUS
  Filled 2012-11-05: qty 2

## 2012-11-05 NOTE — ED Notes (Signed)
Pt c/o abd pain 8/10 since yesterday.  Also c/o NV denies diarrhea.

## 2012-11-05 NOTE — ED Provider Notes (Signed)
History     CSN: [id number]  Arrival date & time 11/05/12  1429   First MD Initiated Contact with Patient 11/05/12 1653      Chief Complaint  Patient presents with  . Abdominal Pain  . Nausea  . Emesis    (Consider location/radiation/quality/duration/timing/severity/associated sxs/prior treatment) Patient is a 61 y.o. female presenting with abdominal pain and vomiting. The history is provided by the patient.  Abdominal Pain The primary symptoms of the illness include abdominal pain and vomiting.  Emesis  Associated symptoms include abdominal pain.   patient here with right upper quadrant pain x2 days after eating Thanksgiving dinner. Notes nausea and vomiting without diarrhea. No fever noted. Pain is burning starts in her epigastric and right upper quadrant area and radiates to her back. Does have a history of acid reflux and has used her usual medications without relief. Denies any urinary symptoms. No gyn complaints. Patient denies any chest pain or anginal type symptoms  Past Medical History  Diagnosis Date  . Acid reflux     No past surgical history on file.  No family history on file.  History  Substance Use Topics  . Smoking status: Never Smoker   . Smokeless tobacco: Not on file  . Alcohol Use: No    OB History    Grav Para Term Preterm Abortions TAB SAB Ect Mult Living                  Review of Systems  Gastrointestinal: Positive for vomiting and abdominal pain.  All other systems reviewed and are negative.    Allergies  Review of patient's allergies indicates no known allergies.  Home Medications   Current Outpatient Rx  Name  Route  Sig  Dispense  Refill  . ALUM & MAG HYDROXIDE-SIMETH 200-200-20 MG/5ML PO SUSP   Oral   Take 15 mLs by mouth every 6 (six) hours as needed. For bloating relief         . ESOMEPRAZOLE MAGNESIUM 40 MG PO CPDR   Oral   Take 1 capsule (40 mg total) by mouth daily.   30 capsule   0   . SIMETHICONE 80 MG PO  CHEW   Oral   Chew 160 mg by mouth every 6 (six) hours as needed. For bloating relief           BP 159/89  Pulse 85  Temp 98.2 F (36.8 C) (Oral)  Resp 18  SpO2 95%  Physical Exam  Nursing note and vitals reviewed. Constitutional: She is oriented to person, place, and time. She appears well-developed and well-nourished.  Non-toxic appearance. No distress.  HENT:  Head: Normocephalic and atraumatic.  Eyes: Conjunctivae normal, EOM and lids are normal. Pupils are equal, round, and reactive to light.  Neck: Normal range of motion. Neck supple. No tracheal deviation present. No mass present.  Cardiovascular: Normal rate, regular rhythm and normal heart sounds.  Exam reveals no gallop.   No murmur heard. Pulmonary/Chest: Effort normal and breath sounds normal. No stridor. No respiratory distress. She has no decreased breath sounds. She has no wheezes. She has no rhonchi. She has no rales.  Abdominal: Soft. Normal appearance and bowel sounds are normal. She exhibits no distension. There is tenderness in the right lower quadrant and epigastric area. There is no rigidity, no rebound, no guarding and no CVA tenderness.  Musculoskeletal: Normal range of motion. She exhibits no edema and no tenderness.  Neurological: She is alert and oriented to person,  place, and time. She has normal strength. No cranial nerve deficit or sensory deficit. GCS eye subscore is 4. GCS verbal subscore is 5. GCS motor subscore is 6.  Skin: Skin is warm and dry. No abrasion and no rash noted.  Psychiatric: She has a normal mood and affect. Her speech is normal and behavior is normal.    ED Course  Procedures (including critical care time)  Labs Reviewed - No data to display No results found.   No diagnosis found.    MDM  Patient given IV pain medications here. Her ultrasound shows cholecystitis. Spoke with Dr. Biagio Quint, on-call general surgery, he will see the patient        Toy Baker,  MD 11/05/12 1946

## 2012-11-05 NOTE — H&P (Signed)
Reason for Consult:cholecystitis Referring Physician: [name] is an 61 y.o. female.  HPI: I was asked to see this patient by Dr. Freida Busman for evaluation of cholelithiasisand cholecystitis. She has had episodes previously of epigastric pain and she was actually seen for similar symptoms about one month ago and was treated for reflux and discharged after her symptoms improved. Since then she has had continued episodes of epigastric abdominal pain which she describes as tightness in her epigastric region and is mainly brought on by E. This most recent episode began Friday after eating some stuffing and gravy and since then has been persistent. She has had some nausea and vomiting and some chills and some radiation of her pain to the right upper quadrant. She had an ultrasound which confirmed cholelithiasis.  Past Medical History  Diagnosis Date  . Acid reflux     History reviewed. No pertinent past surgical history.  History reviewed. No pertinent family history.  Social History:  reports that she has never smoked. She does not have any smokeless tobacco history on file. She reports that she does not drink alcohol. Her drug history not on file.  Allergies: No Known Allergies  Medications: I have reviewed the patient's current medications.  Results for orders placed during the hospital encounter of 11/05/12 (from the past 48 hour(s))  CBC WITH DIFFERENTIAL     Status: Abnormal   Collection Time   11/05/12  6:05 PM      Component Value Range Comment   WBC 14.4 (*) 4.0 - 10.5 K/uL    RBC 5.01  3.87 - 5.11 MIL/uL    Hemoglobin 15.4 (*) 12.0 - 15.0 g/dL    HCT 84.1  32.4 - 40.1 %    MCV 88.6  78.0 - 100.0 fL    MCH 30.7  26.0 - 34.0 pg    MCHC 34.7  30.0 - 36.0 g/dL    RDW 02.7  25.3 - 66.4 %    Platelets 286  150 - 400 K/uL    Neutrophils Relative 84 (*) 43 - 77 %    Neutro Abs 12.1 (*) 1.7 - 7.7 K/uL    Lymphocytes Relative 10 (*) 12 - 46 %    Lymphs Abs 1.4  0.7 - 4.0  K/uL    Monocytes Relative 6  3 - 12 %    Monocytes Absolute 0.9  0.1 - 1.0 K/uL    Eosinophils Relative 0  0 - 5 %    Eosinophils Absolute 0.0  0.0 - 0.7 K/uL    Basophils Relative 0  0 - 1 %    Basophils Absolute 0.0  0.0 - 0.1 K/uL   COMPREHENSIVE METABOLIC PANEL     Status: Abnormal   Collection Time   11/05/12  6:05 PM      Component Value Range Comment   Sodium 136  135 - 145 mEq/L    Potassium 3.7  3.5 - 5.1 mEq/L    Chloride 98  96 - 112 mEq/L    CO2 31  19 - 32 mEq/L    Glucose, Bld 115 (*) 70 - 99 mg/dL    BUN 10  6 - 23 mg/dL    Creatinine, Ser 4.03  0.50 - 1.10 mg/dL    Calcium 9.3  8.4 - 47.4 mg/dL    Total Protein 7.8  6.0 - 8.3 g/dL    Albumin 3.9  3.5 - 5.2 g/dL    AST 14  0 - 37 U/L  ALT 12  0 - 35 U/L    Alkaline Phosphatase 60  39 - 117 U/L    Total Bilirubin 1.2  0.3 - 1.2 mg/dL    GFR calc non Af Amer >90  >90 mL/min    GFR calc Af Amer >90  >90 mL/min   LIPASE, BLOOD     Status: Normal   Collection Time   11/05/12  6:05 PM      Component Value Range Comment   Lipase 29  11 - 59 U/L     US Abdomen Complete  11/05/2012  *RADIOLOGY REPORT*  Clinical Data:  Abdominal pain.  COMPLETE ABDOMINAL ULTRASOUND  Comparison:  None.  Findings:  Gallbladder:  There are multiple stones including a large stone measuring 4.4 cm in diameter.  No gallbladder wall thickening. Adenomyosis.  Positive sonographic Murphy's sign.  Common bile duct:  Slightly prominent at 7.3 mm.  Liver:  Normal.  IVC:  Normal.  Pancreas:  Normal.  Spleen:  Normal.  6.1 cm in length.  Right Kidney:  Normal.  13.9 cm in length.  Left Kidney:  Normal.  13.3 cm in length.  Abdominal aorta:  Normal.  2.3 cm in diameter.  IMPRESSION: Mobile gallstones.  Adenomyosis.  Positive sonographic Murphy's sign suggestive of acute cholecystitis.  Slightly prominent common bile duct without a visible common bile duct stone.   Original Report Authenticated By: Francene Boyers, M.D.     All other review of systems  negative or noncontributory except as stated in the HPI  Blood pressure 173/84, pulse 86, temperature 98.6 F (37 C), temperature source Oral, resp. rate 20, SpO2 90.00%. General appearance: alert, cooperative and no distress Head: Normocephalic, without obvious abnormality, atraumatic Neck: no JVD and supple, symmetrical, trachea midline Resp: clear to auscultation bilaterally Cardio: normal rate, regular GI: soft, tender in epigastric and RUQ, ND, reducible umbilical hernia Extremities: extremities normal, atraumatic, no cyanosis or edema Pulses: 2+ and symmetric Neurologic: Grossly normal  Assessment/Plan: Cholelithiasis and possible cholecystitis She does have gallstones and an elevated white blood cell count is in symptoms concerning for symptomatic cholelithiasis or cholecystitis. Given the duration and persistence of her symptoms I have recommended admission with antibiotics and surgery for treatment of a soon as available. We discussed the options and the procedure itself including the risks of the procedure.The risks of infection, bleeding, pain, persistent symptoms, scarring, injury to bowel or bile ducts, retained stone, diarrhea, need for additional procedures, and need for open surgery discussed with the patient. She expressed understanding and would like to come into the hospital for observation and cholecystectomy as soon as available   Lodema Pilot DAVID 11/05/2012, 8:23 PM

## 2012-11-05 NOTE — ED Notes (Signed)
She tells me she's had epigastric and mid abd. Discomfort plus nausea x 2 days.  She denies diarrhea/fever or diaphoresis.  She is in no distress.  Her skin is normal, warm and dry, and she is breathing normally.

## 2012-11-06 ENCOUNTER — Encounter (HOSPITAL_COMMUNITY): Payer: Self-pay

## 2012-11-06 ENCOUNTER — Encounter (HOSPITAL_COMMUNITY): Admission: EM | Disposition: A | Discharge status: Home / Self Care | Payer: Self-pay | Source: Home / Self Care

## 2012-11-06 ENCOUNTER — Inpatient Hospital Stay (HOSPITAL_COMMUNITY): Payer: Self-pay | Admitting: *Deleted

## 2012-11-06 ENCOUNTER — Inpatient Hospital Stay (HOSPITAL_COMMUNITY): Payer: Self-pay

## 2012-11-06 ENCOUNTER — Encounter (HOSPITAL_COMMUNITY): Payer: Self-pay | Admitting: *Deleted

## 2012-11-06 DIAGNOSIS — Z9049 Acquired absence of other specified parts of digestive tract: Secondary | ICD-10-CM

## 2012-11-06 DIAGNOSIS — K819 Cholecystitis, unspecified: Secondary | ICD-10-CM | POA: Diagnosis present

## 2012-11-06 HISTORY — PX: UMBILICAL HERNIA REPAIR: SHX196

## 2012-11-06 HISTORY — PX: CHOLECYSTECTOMY: SHX55

## 2012-11-06 LAB — CBC
HCT: 43.6 % (ref 36.0–46.0)
Hemoglobin: 14.9 g/dL (ref 12.0–15.0)
MCH: 30.8 pg (ref 26.0–34.0)
MCHC: 34.2 g/dL (ref 30.0–36.0)

## 2012-11-06 LAB — COMPREHENSIVE METABOLIC PANEL
ALT: 12 U/L (ref 0–35)
AST: 13 U/L (ref 0–37)
CO2: 28 mEq/L (ref 19–32)
Calcium: 9 mg/dL (ref 8.4–10.5)
GFR calc non Af Amer: >90 mL/min (ref >90)
Sodium: 133 mEq/L — ABNORMAL LOW (ref 135–145)
Total Protein: 7.8 g/dL (ref 6.0–8.3)

## 2012-11-06 SURGERY — LAPAROSCOPIC CHOLECYSTECTOMY WITH INTRAOPERATIVE CHOLANGIOGRAM
Anesthesia: General | Wound class: Clean Contaminated

## 2012-11-06 MED ORDER — KETAMINE HCL 10 MG/ML IJ SOLN
INTRAMUSCULAR | Status: DC | PRN
  Administered 2012-11-06: 2 mg via INTRAVENOUS
  Administered 2012-11-06: 1 mg via INTRAVENOUS
  Administered 2012-11-06: 2 mg via INTRAVENOUS
  Administered 2012-11-06: 1 mg via INTRAVENOUS
  Administered 2012-11-06 (×2): 2 mg via INTRAVENOUS
  Administered 2012-11-06: 50 mg via INTRAVENOUS

## 2012-11-06 MED ORDER — GLYCOPYRROLATE 0.2 MG/ML IJ SOLN
INTRAMUSCULAR | Status: DC | PRN
  Administered 2012-11-06: 0.4 mg via INTRAVENOUS

## 2012-11-06 MED ORDER — PNEUMOCOCCAL VAC POLYVALENT 25 MCG/0.5ML IJ INJ
0.5000 mL | INJECTION | INTRAMUSCULAR | Status: AC
  Administered 2012-11-07: 0.5 mL via INTRAMUSCULAR
  Filled 2012-11-06 (×2): qty 0.5

## 2012-11-06 MED ORDER — IOHEXOL 300 MG/ML  SOLN
INTRAMUSCULAR | Status: DC | PRN
  Administered 2012-11-06: 7 mL

## 2012-11-06 MED ORDER — HEPARIN SODIUM (PORCINE) 5000 UNIT/ML IJ SOLN: 5000.0000 [IU] | Freq: Three times a day (TID) | INTRAMUSCULAR | Status: DC

## 2012-11-06 MED ORDER — LACTATED RINGERS IV SOLN: INTRAVENOUS | Status: DC

## 2012-11-06 MED ORDER — METOCLOPRAMIDE HCL 5 MG/ML IJ SOLN
INTRAMUSCULAR | Status: DC | PRN
  Administered 2012-11-06: 10 mg via INTRAVENOUS

## 2012-11-06 MED ORDER — SUCCINYLCHOLINE CHLORIDE 20 MG/ML IJ SOLN
INTRAMUSCULAR | Status: DC | PRN
  Administered 2012-11-06: 100 mg via INTRAVENOUS

## 2012-11-06 MED ORDER — MIDAZOLAM HCL 5 MG/5ML IJ SOLN
INTRAMUSCULAR | Status: DC | PRN
  Administered 2012-11-06 (×2): 1 mg via INTRAVENOUS

## 2012-11-06 MED ORDER — ALBUTEROL SULFATE HFA 108 (90 BASE) MCG/ACT IN AERS
INHALATION_SPRAY | RESPIRATORY_TRACT | Status: DC | PRN
  Administered 2012-11-06: 5 via RESPIRATORY_TRACT

## 2012-11-06 MED ORDER — DEXAMETHASONE SODIUM PHOSPHATE 4 MG/ML IJ SOLN
INTRAMUSCULAR | Status: DC | PRN
  Administered 2012-11-06: 10 mg via INTRAVENOUS

## 2012-11-06 MED ORDER — IOHEXOL 300 MG/ML  SOLN
INTRAMUSCULAR | Status: AC
  Filled 2012-11-06: qty 1

## 2012-11-06 MED ORDER — BUPIVACAINE LIPOSOME 1.3 % IJ SUSP
INTRAMUSCULAR | Status: DC | PRN
  Administered 2012-11-06: 20 mL

## 2012-11-06 MED ORDER — KETOROLAC TROMETHAMINE 30 MG/ML IJ SOLN
INTRAMUSCULAR | Status: DC | PRN
  Administered 2012-11-06: 30 mg via INTRAVENOUS

## 2012-11-06 MED ORDER — PROPOFOL 10 MG/ML IV BOLUS
INTRAVENOUS | Status: DC | PRN
  Administered 2012-11-06: 150 mg via INTRAVENOUS

## 2012-11-06 MED ORDER — BUPIVACAINE LIPOSOME 1.3 % IJ SUSP
20.0000 mL | Freq: Once | INTRAMUSCULAR | Status: DC
  Filled 2012-11-06: qty 20

## 2012-11-06 MED ORDER — INFLUENZA VIRUS VACC SPLIT PF IM SUSP
0.5000 mL | INTRAMUSCULAR | Status: AC
  Administered 2012-11-07: 0.5 mL via INTRAMUSCULAR
  Filled 2012-11-06 (×2): qty 0.5

## 2012-11-06 MED ORDER — HYDRALAZINE HCL 20 MG/ML IJ SOLN
10.0000 mg | Freq: Once | INTRAMUSCULAR | Status: AC
  Administered 2012-11-06: 10 mg via INTRAVENOUS
  Filled 2012-11-06: qty 1

## 2012-11-06 MED ORDER — KCL IN DEXTROSE-NACL 20-5-0.45 MEQ/L-%-% IV SOLN
INTRAVENOUS | Status: DC
  Administered 2012-11-06 – 2012-11-07 (×2): via INTRAVENOUS
  Filled 2012-11-06 (×3): qty 1000

## 2012-11-06 MED ORDER — LACTATED RINGERS IV SOLN
INTRAVENOUS | Status: DC
  Administered 2012-11-06: 10:00:00 via INTRAVENOUS
  Administered 2012-11-06: 1000 mL via INTRAVENOUS

## 2012-11-06 MED ORDER — PHENYLEPHRINE HCL 10 MG/ML IJ SOLN
INTRAMUSCULAR | Status: DC | PRN
  Administered 2012-11-06: 120 ug via INTRAVENOUS
  Administered 2012-11-06 (×2): 80 ug via INTRAVENOUS
  Administered 2012-11-06: 120 ug via INTRAVENOUS

## 2012-11-06 MED ORDER — ACETAMINOPHEN 10 MG/ML IV SOLN
INTRAVENOUS | Status: AC
  Filled 2012-11-06: qty 100

## 2012-11-06 MED ORDER — ACETAMINOPHEN 10 MG/ML IV SOLN
INTRAVENOUS | Status: DC | PRN
  Administered 2012-11-06: 1000 mg via INTRAVENOUS

## 2012-11-06 MED ORDER — FENTANYL CITRATE 0.05 MG/ML IJ SOLN
INTRAMUSCULAR | Status: DC | PRN
  Administered 2012-11-06 (×4): 50 ug via INTRAVENOUS

## 2012-11-06 MED ORDER — NALOXONE HCL 0.4 MG/ML IJ SOLN
INTRAMUSCULAR | Status: DC | PRN
  Administered 2012-11-06: .04 mg via INTRAVENOUS

## 2012-11-06 MED ORDER — NEOSTIGMINE METHYLSULFATE 1 MG/ML IJ SOLN
INTRAMUSCULAR | Status: DC | PRN
  Administered 2012-11-06: 4 mg via INTRAVENOUS

## 2012-11-06 MED ORDER — ONDANSETRON HCL 4 MG/2ML IJ SOLN
INTRAMUSCULAR | Status: DC | PRN
  Administered 2012-11-06: 4 mg via INTRAVENOUS

## 2012-11-06 MED ORDER — FENTANYL CITRATE 0.05 MG/ML IJ SOLN: 25.0000 ug | INTRAMUSCULAR | Status: DC | PRN

## 2012-11-06 MED ORDER — EPHEDRINE SULFATE 50 MG/ML IJ SOLN
INTRAMUSCULAR | Status: DC | PRN
  Administered 2012-11-06: 5 mg via INTRAVENOUS
  Administered 2012-11-06 (×2): 10 mg via INTRAVENOUS

## 2012-11-06 MED ORDER — LACTATED RINGERS IR SOLN
Status: DC | PRN
  Administered 2012-11-06: 1000 mL

## 2012-11-06 MED ORDER — HYDROMORPHONE HCL PF 1 MG/ML IJ SOLN
INTRAMUSCULAR | Status: DC | PRN
  Administered 2012-11-06: 1 mg via INTRAVENOUS

## 2012-11-06 MED ORDER — CISATRACURIUM BESYLATE (PF) 10 MG/5ML IV SOLN
INTRAVENOUS | Status: DC | PRN
  Administered 2012-11-06: 2 mg via INTRAVENOUS
  Administered 2012-11-06: 4 mg via INTRAVENOUS

## 2012-11-06 MED ORDER — PHENYLEPHRINE HCL 10 MG/ML IJ SOLN
10.0000 mg | INTRAVENOUS | Status: DC | PRN
  Administered 2012-11-06: 50 ug/min via INTRAVENOUS

## 2012-11-06 SURGICAL SUPPLY — 42 items
APPLIER CLIP 5 13 M/L LIGAMAX5 (MISCELLANEOUS)
APPLIER CLIP ROT 10 11.4 M/L (STAPLE)
BENZOIN TINCTURE PRP APPL 2/3 (GAUZE/BANDAGES/DRESSINGS) IMPLANT
CABLE HIGH FREQUENCY MONO STRZ (ELECTRODE) ×3 IMPLANT
CANISTER SUCTION 2500CC (MISCELLANEOUS) ×3 IMPLANT
CATH REDDICK CHOLANGI 4FR 50CM (CATHETERS) ×3 IMPLANT
CLIP APPLIE 5 13 M/L LIGAMAX5 (MISCELLANEOUS) IMPLANT
CLIP APPLIE ROT 10 11.4 M/L (STAPLE) IMPLANT
CLOTH BEACON ORANGE TIMEOUT ST (SAFETY) ×3 IMPLANT
COVER MAYO STAND STRL (DRAPES) ×3 IMPLANT
COVER SURGICAL LIGHT HANDLE (MISCELLANEOUS) ×3 IMPLANT
DECANTER SPIKE VIAL GLASS SM (MISCELLANEOUS) IMPLANT
DERMABOND ADVANCED (GAUZE/BANDAGES/DRESSINGS) ×1
DERMABOND ADVANCED .7 DNX12 (GAUZE/BANDAGES/DRESSINGS) ×2 IMPLANT
DRAPE C-ARM 42X72 X-RAY (DRAPES) ×3 IMPLANT
DRAPE LAPAROSCOPIC ABDOMINAL (DRAPES) ×3 IMPLANT
ELECT REM PT RETURN 9FT ADLT (ELECTROSURGICAL) ×3
ELECTRODE REM PT RTRN 9FT ADLT (ELECTROSURGICAL) ×2 IMPLANT
GLOVE BIOGEL M 8.0 STRL (GLOVE) ×18 IMPLANT
GOWN STRL NON-REIN LRG LVL3 (GOWN DISPOSABLE) ×3 IMPLANT
GOWN STRL REIN XL XLG (GOWN DISPOSABLE) ×6 IMPLANT
HEMOSTAT SURGICEL 4X8 (HEMOSTASIS) IMPLANT
IV CATH 14GX2 1/4 (CATHETERS) ×3 IMPLANT
KIT BASIN OR (CUSTOM PROCEDURE TRAY) ×3 IMPLANT
NS IRRIG 1000ML POUR BTL (IV SOLUTION) ×3 IMPLANT
POUCH SPECIMEN RETRIEVAL 10MM (ENDOMECHANICALS) ×3 IMPLANT
SCISSORS LAP 5X35 DISP (ENDOMECHANICALS) ×3 IMPLANT
SET IRRIG TUBING LAPAROSCOPIC (IRRIGATION / IRRIGATOR) ×3 IMPLANT
SLEEVE Z-THREAD 5X100MM (TROCAR) IMPLANT
SOLUTION ANTI FOG 6CC (MISCELLANEOUS) ×3 IMPLANT
STRIP CLOSURE SKIN 1/2X4 (GAUZE/BANDAGES/DRESSINGS) ×3 IMPLANT
SUT PROLENE 0 CT 2 (SUTURE) ×6 IMPLANT
SUT VIC AB 4-0 SH 18 (SUTURE) ×3 IMPLANT
SYR 30ML LL (SYRINGE) ×3 IMPLANT
TOWEL OR 17X26 10 PK STRL BLUE (TOWEL DISPOSABLE) ×3 IMPLANT
TRAY LAP CHOLE (CUSTOM PROCEDURE TRAY) ×3 IMPLANT
TROCAR BLADELESS OPT 5 75 (ENDOMECHANICALS) ×9 IMPLANT
TROCAR XCEL BLUNT TIP 100MML (ENDOMECHANICALS) IMPLANT
TROCAR XCEL NON-BLD 11X100MML (ENDOMECHANICALS) ×3 IMPLANT
TROCAR Z-THREAD FIOS 11X100 BL (TROCAR) IMPLANT
TROCAR Z-THREAD FIOS 5X100MM (TROCAR) IMPLANT
TUBING INSUFFLATION 10FT LAP (TUBING) ×3 IMPLANT

## 2012-11-06 NOTE — Op Note (Signed)
Surgeon: Wenda Low, MD, FACS  Asst:  none  Anes:  GET  Procedure: Lap chole with IOC and repair of 2 cm preexisting umbilical hernia  Diagnosis: Acute cholecystitis  Complications: none  EBL:   14 cc  Description of Procedure:  The patient to the [facility] bowel service with acute cholecystitis.  Informed consent was obtained regarding laparoscopic and open cholecystectomy. Patient was taken to [facility] or 6 and given general anesthesia. The abdomen was prepped with PCMX and draped sterilely. Access to the abdomen was achieved with a 5 mm Optiview the right upper quadrant. Patient had an obvious umbilical hernia that point and with insufflation this bulged out. I elected to put a 5 mm obliquely above that the plan to repair the umbilical hernia and use it is an extraction site for this acute cholecystitis. 11 mm placed in the upper midline and 3 fives were used. The gallbladder was first decompressed and grasped. She had Hughie Closs bands over her liver. There were extensive inflammatory changes noted in a very large stone impacted distally. I was able to the sac the infundibulum and the cystic duct free and put a clip on the base of the gallbladder and then incised the cystic duct. A dynamic cholangiogram was performed which showed a slightly tortuous cystic duct that was dilated with a dilated slightly common bile duct with free flow into the duodenum and good intrahepatic filling. The cystic duct was then triple clipped and divided and the cystic artery was double clipped and divided. The gallbladder was then removed with a tedious dissection using the hook electrocautery that did not get into the gallbladder. I was able to separate from the gallbladder bed without issue and then placed in a bag. No bleeding or bile leaks were seen.  I then made a longitudinal incision cutting down through the umbilical hernia and extracting this gallbladder within the gallbladder bag with some difficulty just  because of the size of the gallstone. Once removed I then repaired the hernia with interrupted #0 Prolene sutures. I examined the repair following this and there was nothing herniated or caught up and it. I inspected the gallbladder bed and since any bleeding or bile leaks irrigated that area. Port sites were injected with Exparel and closed 4-0 Vicryl and Dermabond. The patient are that she is well was taken recovery room in satisfactory condition.  Matt B. Daphine Deutscher, MD, [facility], [place] [phone]

## 2012-11-06 NOTE — Preoperative (Signed)
Beta Blockers   Reason not to administer Beta Blockers:Not Applicable, not on home BB 

## 2012-11-06 NOTE — Anesthesia Preprocedure Evaluation (Addendum)
Anesthesia Evaluation  Patient identified by MRN, date of birth, ID band Patient awake    Reviewed: Allergy & Precautions, H&P , NPO status , Patient's Chart, lab work & pertinent test results  Airway Mallampati: III TM Distance: >3 FB Neck ROM: full    Dental  (+) Edentulous Upper and Edentulous Lower   Pulmonary neg pulmonary ROS,  breath sounds clear to auscultation  Pulmonary exam normal       Cardiovascular Exercise Tolerance: Good negative cardio ROS  Rhythm:regular Rate:Normal     Neuro/Psych negative neurological ROS  negative psych ROS   GI/Hepatic negative GI ROS, Neg liver ROS, GERD-  Medicated and Controlled,  Endo/Other  negative endocrine ROS  Renal/GU negative Renal ROS  negative genitourinary   Musculoskeletal   Abdominal   Peds  Hematology negative hematology ROS (+)   Anesthesia Other Findings   Reproductive/Obstetrics negative OB ROS                          Anesthesia Physical Anesthesia Plan  ASA: I  Anesthesia Plan: General   Post-op Pain Management:    Induction: Intravenous  Airway Management Planned: Oral ETT  Additional Equipment:   Intra-op Plan:   Post-operative Plan:   Informed Consent: I have reviewed the patients History and Physical, chart, labs and discussed the procedure including the risks, benefits and alternatives for the proposed anesthesia with the patient or authorized representative who has indicated his/her understanding and acceptance.   Dental Advisory Given  Plan Discussed with: CRNA and Surgeon  Anesthesia Plan Comments:         Anesthesia Quick Evaluation

## 2012-11-06 NOTE — Anesthesia Postprocedure Evaluation (Signed)
  Anesthesia Post-op Note  Patient: Pamela Soto  Procedure(s) Performed: Procedure(s) (LRB): LAPAROSCOPIC CHOLECYSTECTOMY WITH INTRAOPERATIVE CHOLANGIOGRAM (N/A) HERNIA REPAIR UMBILICAL ADULT ()  Patient Location: PACU  Anesthesia Type: General  Level of Consciousness: awake and alert   Airway and Oxygen Therapy: Patient Spontanous Breathing  Post-op Pain: mild  Post-op Assessment: Post-op Vital signs reviewed, Patient's Cardiovascular Status Stable, Respiratory Function Stable, Patent Airway and No signs of Nausea or vomiting  Last Vitals:  Filed Vitals:   11/06/12 1145  BP: 133/59  Pulse: 88  Temp:   Resp: 13    Post-op Vital Signs: stable   Complications: No apparent anesthesia complications

## 2012-11-06 NOTE — Transfer of Care (Signed)
Immediate Anesthesia Transfer of Care Note  Patient: Pamela Soto  Procedure(s) Performed: Procedure(s) (LRB) with comments: LAPAROSCOPIC CHOLECYSTECTOMY WITH INTRAOPERATIVE CHOLANGIOGRAM (N/A) HERNIA REPAIR UMBILICAL ADULT ()  Patient Location: PACU  Anesthesia Type:General  Level of Consciousness: awake, sedated, patient cooperative and responds to stimulation  Airway & Oxygen Therapy: Patient Spontanous Breathing and Patient connected to face mask oxygen  Post-op Assessment: Report given to PACU RN, Post -op Vital signs reviewed and stable and Patient moving all extremities  Post vital signs: Reviewed and stable  Complications: No apparent anesthesia complications

## 2012-11-06 NOTE — Progress Notes (Signed)
Patient ID: Pamela Soto, female   DOB: 1951-06-21, 61 y.o.   MRN: [id number] [facility] Surgery Progress Note:   * No surgery found *  Subjective: Mental status is clear. In pain since Friday.   Objective: Vital signs in last 24 hours: Temp:  [98 F (36.7 C)-98.6 F (37 C)] 98.6 F (37 C) (12/02 4098) Pulse Rate:  [85-98] 98  (12/02 0637) Resp:  [18-20] 18  (12/02 0637) BP: (159-183)/(84-98) 165/98 mmHg (12/02 0637) SpO2:  [90 %-99 %] 94 % (12/02 0637) Weight:  [224 lb 10.4 oz (101.9 kg)] 224 lb 10.4 oz (101.9 kg) (12/01 2200)  Intake/Output from previous day:   Intake/Output this shift:    Physical Exam: Work of breathing is normal.  Tender in midepigastrium  Lab Results:  Results for orders placed during the hospital encounter of 11/05/12 (from the past 48 hour(s))  CBC WITH DIFFERENTIAL     Status: Abnormal   Collection Time   11/05/12  6:05 PM      Component Value Range Comment   WBC 14.4 (*) 4.0 - 10.5 K/uL    RBC 5.01  3.87 - 5.11 MIL/uL    Hemoglobin 15.4 (*) 12.0 - 15.0 g/dL    HCT 11.9  14.7 - 82.9 %    MCV 88.6  78.0 - 100.0 fL    MCH 30.7  26.0 - 34.0 pg    MCHC 34.7  30.0 - 36.0 g/dL    RDW 56.2  13.0 - 86.5 %    Platelets 286  150 - 400 K/uL    Neutrophils Relative 84 (*) 43 - 77 %    Neutro Abs 12.1 (*) 1.7 - 7.7 K/uL    Lymphocytes Relative 10 (*) 12 - 46 %    Lymphs Abs 1.4  0.7 - 4.0 K/uL    Monocytes Relative 6  3 - 12 %    Monocytes Absolute 0.9  0.1 - 1.0 K/uL    Eosinophils Relative 0  0 - 5 %    Eosinophils Absolute 0.0  0.0 - 0.7 K/uL    Basophils Relative 0  0 - 1 %    Basophils Absolute 0.0  0.0 - 0.1 K/uL   COMPREHENSIVE METABOLIC PANEL     Status: Abnormal   Collection Time   11/05/12  6:05 PM      Component Value Range Comment   Sodium 136  135 - 145 mEq/L    Potassium 3.7  3.5 - 5.1 mEq/L    Chloride 98  96 - 112 mEq/L    CO2 31  19 - 32 mEq/L    Glucose, Bld 115 (*) 70 - 99 mg/dL    BUN 10  6 - 23 mg/dL    Creatinine, Ser  7.84  0.50 - 1.10 mg/dL    Calcium 9.3  8.4 - 69.6 mg/dL    Total Protein 7.8  6.0 - 8.3 g/dL    Albumin 3.9  3.5 - 5.2 g/dL    AST 14  0 - 37 U/L    ALT 12  0 - 35 U/L    Alkaline Phosphatase 60  39 - 117 U/L    Total Bilirubin 1.2  0.3 - 1.2 mg/dL    GFR calc non Af Amer >90  >90 mL/min    GFR calc Af Amer >90  >90 mL/min   LIPASE, BLOOD     Status: Normal   Collection Time   11/05/12  6:05 PM  Component Value Range Comment   Lipase 29  11 - 59 U/L   GLUCOSE, CAPILLARY     Status: Normal   Collection Time   11/05/12  9:43 PM      Component Value Range Comment   Glucose-Capillary 99  70 - 99 mg/dL   COMPREHENSIVE METABOLIC PANEL     Status: Abnormal   Collection Time   11/06/12  5:08 AM      Component Value Range Comment   Sodium 133 (*) 135 - 145 mEq/L    Potassium 3.6  3.5 - 5.1 mEq/L    Chloride 95 (*) 96 - 112 mEq/L    CO2 28  19 - 32 mEq/L    Glucose, Bld 138 (*) 70 - 99 mg/dL    BUN 9  6 - 23 mg/dL    Creatinine, Ser 2.13  0.50 - 1.10 mg/dL    Calcium 9.0  8.4 - 08.6 mg/dL    Total Protein 7.8  6.0 - 8.3 g/dL    Albumin 3.8  3.5 - 5.2 g/dL    AST 13  0 - 37 U/L    ALT 12  0 - 35 U/L    Alkaline Phosphatase 61  39 - 117 U/L    Total Bilirubin 1.4 (*) 0.3 - 1.2 mg/dL    GFR calc non Af Amer >90  >90 mL/min    GFR calc Af Amer >90  >90 mL/min   CBC     Status: Abnormal   Collection Time   11/06/12  5:08 AM      Component Value Range Comment   WBC 17.6 (*) 4.0 - 10.5 K/uL    RBC 4.84  3.87 - 5.11 MIL/uL    Hemoglobin 14.9  12.0 - 15.0 g/dL    HCT 57.8  46.9 - 62.9 %    MCV 90.1  78.0 - 100.0 fL    MCH 30.8  26.0 - 34.0 pg    MCHC 34.2  30.0 - 36.0 g/dL    RDW 52.8  41.3 - 24.4 %    Platelets 285  150 - 400 K/uL   SURGICAL PCR SCREEN     Status: Normal   Collection Time   11/06/12  5:34 AM      Component Value Range Comment   MRSA, PCR NEGATIVE  NEGATIVE    Staphylococcus aureus NEGATIVE  NEGATIVE     Radiology/Results: US Abdomen Complete  11/05/2012   *RADIOLOGY REPORT*  Clinical Data:  Abdominal pain.  COMPLETE ABDOMINAL ULTRASOUND  Comparison:  None.  Findings:  Gallbladder:  There are multiple stones including a large stone measuring 4.4 cm in diameter.  No gallbladder wall thickening. Adenomyosis.  Positive sonographic Murphy's sign.  Common bile duct:  Slightly prominent at 7.3 mm.  Liver:  Normal.  IVC:  Normal.  Pancreas:  Normal.  Spleen:  Normal.  6.1 cm in length.  Right Kidney:  Normal.  13.9 cm in length.  Left Kidney:  Normal.  13.3 cm in length.  Abdominal aorta:  Normal.  2.3 cm in diameter.  IMPRESSION: Mobile gallstones.  Adenomyosis.  Positive sonographic Murphy's sign suggestive of acute cholecystitis.  Slightly prominent common bile duct without a visible common bile duct stone.   Original Report Authenticated By: Francene Boyers, M.D.     Anti-infectives: Anti-infectives     Start     Dose/Rate Route Frequency Ordered Stop   11/05/12 2300   levofloxacin (LEVAQUIN) IVPB 500 mg  500 mg 100 mL/hr over 60 Minutes Intravenous Every 24 hours 11/05/12 2156            Assessment/Plan: Problem List: Patient Active Problem List  Diagnosis  . CHEST PAIN, HX OF  . GASTROESOPHAGEAL REFLUX DISEASE, HX OF  . UTI'S, HX OF  . Cholecystitis    I have discussed lap/open cholecystectomy with her in detail including complications not limited to bleeding, bowel leaks, bile leaks, and CBD injury.  She wants to proceed with lap chole this am.   * No surgery found *    LOS: 1 day   Matt B. Daphine Deutscher, MD, [facility] [phone] beeper [phone]  11/06/2012 7:48 AM

## 2012-11-07 ENCOUNTER — Encounter (INDEPENDENT_AMBULATORY_CARE_PROVIDER_SITE_OTHER): Payer: Self-pay | Admitting: Internal Medicine

## 2012-11-07 ENCOUNTER — Encounter (HOSPITAL_COMMUNITY): Payer: Self-pay | Admitting: Surgery

## 2012-11-07 LAB — COMPREHENSIVE METABOLIC PANEL
Albumin: 3.2 g/dL — ABNORMAL LOW (ref 3.5–5.2)
BUN: 10 mg/dL (ref 6–23)
Calcium: 8.8 mg/dL (ref 8.4–10.5)
Creatinine, Ser: 0.7 mg/dL (ref 0.50–1.10)
GFR calc Af Amer: >90 mL/min (ref >90)
Glucose, Bld: 113 mg/dL — ABNORMAL HIGH (ref 70–99)
Total Protein: 6.7 g/dL (ref 6.0–8.3)

## 2012-11-07 LAB — CBC
HCT: 40 % (ref 36.0–46.0)
Hemoglobin: 13.2 g/dL (ref 12.0–15.0)
MCV: 91.5 fL (ref 78.0–100.0)
RBC: 4.37 MIL/uL (ref 3.87–5.11)
RDW: 13.4 % (ref 11.5–15.5)
WBC: 16 10*3/uL — ABNORMAL HIGH (ref 4.0–10.5)

## 2012-11-07 MED ORDER — METOPROLOL TARTRATE 25 MG PO TABS: 25.0000 mg | ORAL_TABLET | Freq: Two times a day (BID) | ORAL | Status: DC

## 2012-11-07 MED ORDER — METOPROLOL TARTRATE 25 MG PO TABS
25.0000 mg | ORAL_TABLET | Freq: Two times a day (BID) | ORAL | Status: DC
  Administered 2012-11-07: 25 mg via ORAL
  Filled 2012-11-07 (×2): qty 1

## 2012-11-07 MED ORDER — OXYCODONE HCL 5 MG PO TABS: 5.0000 mg | ORAL_TABLET | ORAL | Status: DC | PRN

## 2012-11-07 MED ORDER — OXYCODONE HCL 5 MG PO TABS
5.0000 mg | ORAL_TABLET | ORAL | Status: DC | PRN
  Administered 2012-11-07: 5 mg via ORAL
  Filled 2012-11-07 (×2): qty 1

## 2012-11-07 NOTE — Discharge Summary (Signed)
  Physician Discharge Summary  Patient ID: Pamela Soto MRN: [id number] DOB/AGE: [birth year] 60 y.o.  Admit date: 11/05/2012 Discharge date: 11/07/2012  Admitting Diagnosis: Cholecystitis Cholelithiasis  Discharge Diagnosis Cholecystitis Cholelithiasis  Consultants None  Procedures 12/2 Laparoscopic Cholecystectomy with [facility]  Hospital Course 60 yr old female who presented to [facility] with abdominal pain, nausea and emesis.  Workup showed cholelithiasis and cholecystitis.  Patient was admitted and underwent procedure listed above.  Tolerated procedure well and was transferred to the floor.  Diet was advanced as tolerated.  On POD#1, the patient was voiding well, tolerating diet, ambulating well, pain well controlled, vital signs stable, incisions c/d/i and felt stable for discharge home.  Patient will follow up in our office in 2 weeks and knows to call with questions or concerns.    Medication List     As of 11/07/2012  8:07 AM    TAKE these medications         alum & mag hydroxide-simeth 200-200-20 MG/5ML suspension   Commonly known as: MAALOX/MYLANTA   Take 15 mLs by mouth every 6 (six) hours as needed. For bloating relief      esomeprazole 40 MG capsule   Commonly known as: NEXIUM   Take 1 capsule (40 mg total) by mouth daily.      metoprolol tartrate 25 MG tablet   Commonly known as: LOPRESSOR   Take 1 tablet (25 mg total) by mouth 2 (two) times daily.      oxyCODONE 5 MG immediate release tablet   Commonly known as: Oxy IR/ROXICODONE   Take 1-2 tablets (5-10 mg total) by mouth every 4 (four) hours as needed.      simethicone 80 MG chewable tablet   Commonly known as: MYLICON   Chew 160 mg by mouth every 6 (six) hours as needed. For bloating relief          Follow-up Information    Follow up with [facility] On 11/21/2012. (Arrive at [facility] for 3:15 appointment)    Contact information:   [address] [place]  [place] [address] [phone]         Signed: Denny Levy [facility] [phone]  11/07/2012, 7:12 AM

## 2012-11-07 NOTE — Discharge Instructions (Signed)
[facility] ______[facility] °LAPAROSCOPIC SURGERY: POST OP INSTRUCTIONS °Always review your discharge instruction sheet given to you by the facility where your surgery was performed. °IF YOU HAVE DISABILITY OR FAMILY LEAVE FORMS, YOU MUST BRING THEM TO THE OFFICE FOR PROCESSING.   °DO NOT GIVE THEM TO YOUR DOCTOR. ° °1. A prescription for pain medication may be given to you upon discharge.  Take your pain medication as prescribed, if needed.  If narcotic pain medicine is not needed, then you may take acetaminophen (Tylenol) or ibuprofen (Advil) as needed. °2. Take your usually prescribed medications unless otherwise directed. °3. If you need a refill on your pain medication, please contact your pharmacy.  They will contact our office to request authorization. Prescriptions will not be filled after 5pm or on week-ends. °4. You should follow a light diet the first few days after arrival home, such as soup and crackers, etc.  Be sure to include lots of fluids daily. °5. Most patients will experience some swelling and bruising in the area of the incisions.  Ice packs will help.  Swelling and bruising can take several days to resolve.  °6. It is common to experience some constipation if taking pain medication after surgery.  Increasing fluid intake and taking a stool softener (such as Colace) will usually help or prevent this problem from occurring.  A mild laxative (Milk of Magnesia or Miralax) should be taken according to package instructions if there are no bowel movements after 48 hours. °7. Unless discharge instructions indicate otherwise, you may remove your bandages 24-48 hours after surgery, and you may shower at that time.  You may have steri-strips (small skin tapes) in place directly over the incision.  These strips should be left on the skin for 7-10 days.  If your surgeon used skin glue on the incision, you may shower in 24 hours.  The glue will flake off over the next 2-3 weeks.  Any sutures or  staples will be removed at the office during your follow-up visit. °8. ACTIVITIES:  You may resume regular (light) daily activities beginning the next day--such as daily self-care, walking, climbing stairs--gradually increasing activities as tolerated.  You may have sexual intercourse when it is comfortable.  Refrain from any heavy lifting or straining until approved by your doctor. °a. You may drive when you are no longer taking prescription pain medication, you can comfortably wear a seatbelt, and you can safely maneuver your car and apply brakes. °b. RETURN TO WORK:  __________________________________________________________ °9. You should see your doctor in the office for a follow-up appointment approximately 2-3 weeks after your surgery.  Make sure that you call for this appointment within a day or two after you arrive home to insure a convenient appointment time. °10. OTHER INSTRUCTIONS: __________________________________________________________________________________________________________________________ __________________________________________________________________________________________________________________________ °WHEN TO CALL YOUR DOCTOR: °1. Fever over 101.0 °2. Inability to urinate °3. Continued bleeding from incision. °4. Increased pain, redness, or drainage from the incision. °5. Increasing abdominal pain ° °The clinic staff is available to answer your questions during regular business hours.  Please don’t hesitate to call and ask to speak to one of the nurses for clinical concerns.  If you have a medical emergency, go to the nearest emergency room or call 911.  A surgeon from [facility] is always on call at the hospital. °[address], [address], [place], [place]  [address] ? [address] [address], [place], [place]   [address] °[phone] ? [phone] ? FAX [phone] °Web site:   [email or web address] °

## 2012-11-21 ENCOUNTER — Ambulatory Visit (INDEPENDENT_AMBULATORY_CARE_PROVIDER_SITE_OTHER): Payer: Self-pay | Admitting: General Surgery

## 2012-11-21 ENCOUNTER — Encounter (INDEPENDENT_AMBULATORY_CARE_PROVIDER_SITE_OTHER): Payer: Self-pay

## 2012-11-21 ENCOUNTER — Encounter (INDEPENDENT_AMBULATORY_CARE_PROVIDER_SITE_OTHER): Payer: Self-pay | Admitting: General Surgery

## 2012-11-21 VITALS — BP 134/68 | HR 92 | Temp 97.3°F | Ht 65.5 in | Wt 225.4 lb

## 2012-11-21 DIAGNOSIS — K8010 Calculus of gallbladder with chronic cholecystitis without obstruction: Secondary | ICD-10-CM

## 2012-11-21 DIAGNOSIS — K8066 Calculus of gallbladder and bile duct with acute and chronic cholecystitis without obstruction: Secondary | ICD-10-CM

## 2012-11-21 NOTE — Progress Notes (Signed)
Pamela Soto Sep 03, 1951 [id number] 11/21/2012   Pamela Soto is a 61 y.o. female who had a laparoscopic cholecystectomy with intraoperative cholangiogram by Dr. Luretha Murphy.  The pathology report confirmed Chronic cholecystitis, and cholelithiasis.  The patient reports that they are feeling well with normal bowel movements and good appetite.  The pre-operative symptoms of abdominal pain, nausea, and vomiting have resolved.    Physical examination - Incisions appear well-healed with no sign of infection or bleeding.   Abdomen - soft, non-tender BP 134/68  Pulse 92  Temp 97.3 F (36.3 C) (Temporal)  Ht 5' 5.5" (1.664 m)  Wt 225 lb 6.4 oz (102.241 kg)  BMI 36.94 kg/m2  SpO2 94%  Prior to Admission medications   Medication Sig Start Date End Date Taking? Authorizing Provider  alum & mag hydroxide-simeth (MAALOX/MYLANTA) 200-200-20 MG/5ML suspension Take 15 mLs by mouth every 6 (six) hours as needed. For bloating relief   Yes Historical Provider, MD  esomeprazole (NEXIUM) 40 MG capsule Take 1 capsule (40 mg total) by mouth daily. 10/14/12  Yes April K Palumbo-Rasch, MD  metoprolol tartrate (LOPRESSOR) 25 MG tablet Take 1 tablet (25 mg total) by mouth 2 (two) times daily. 11/07/12  Yes Doristine Mango, PA-C  oxyCODONE (OXY IR/ROXICODONE) 5 MG immediate release tablet Take 1-2 tablets (5-10 mg total) by mouth every 4 (four) hours as needed. 11/07/12  Yes Doristine Mango, PA-C  simethicone (MYLICON) 80 MG chewable tablet Chew 160 mg by mouth every 6 (six) hours as needed. For bloating relief   Yes Historical Provider, MD    Impression:  s/p laparoscopic cholecystectomy  Plan:  She may resume a regular diet and full activity.  She may follow-up on a PRN basis.

## 2012-11-21 NOTE — Patient Instructions (Addendum)

## 2013-10-11 ENCOUNTER — Other Ambulatory Visit: Payer: Self-pay

## 2017-01-04 ENCOUNTER — Inpatient Hospital Stay (HOSPITAL_COMMUNITY)
Admission: EM | Admit: 2017-01-04 | Discharge: 2017-01-14 | DRG: 286 | Disposition: A | Discharge status: Home / Self Care | Payer: Medicare Other | Attending: Internal Medicine | Admitting: Internal Medicine

## 2017-01-04 ENCOUNTER — Emergency Department (HOSPITAL_COMMUNITY): Payer: Medicare Other

## 2017-01-04 ENCOUNTER — Encounter (HOSPITAL_COMMUNITY): Payer: Self-pay | Admitting: Emergency Medicine

## 2017-01-04 DIAGNOSIS — I5043 Acute on chronic combined systolic (congestive) and diastolic (congestive) heart failure: Principal | ICD-10-CM | POA: Diagnosis present

## 2017-01-04 DIAGNOSIS — I255 Ischemic cardiomyopathy: Secondary | ICD-10-CM | POA: Diagnosis present

## 2017-01-04 DIAGNOSIS — Z6841 Body Mass Index (BMI) 40.0 and over, adult: Secondary | ICD-10-CM

## 2017-01-04 DIAGNOSIS — I272 Pulmonary hypertension, unspecified: Secondary | ICD-10-CM | POA: Diagnosis not present

## 2017-01-04 DIAGNOSIS — J441 Chronic obstructive pulmonary disease with (acute) exacerbation: Secondary | ICD-10-CM | POA: Diagnosis present

## 2017-01-04 DIAGNOSIS — J9601 Acute respiratory failure with hypoxia: Secondary | ICD-10-CM | POA: Diagnosis not present

## 2017-01-04 DIAGNOSIS — Z79899 Other long term (current) drug therapy: Secondary | ICD-10-CM

## 2017-01-04 DIAGNOSIS — R7302 Impaired glucose tolerance (oral): Secondary | ICD-10-CM

## 2017-01-04 DIAGNOSIS — D72829 Elevated white blood cell count, unspecified: Secondary | ICD-10-CM | POA: Diagnosis not present

## 2017-01-04 DIAGNOSIS — R7303 Prediabetes: Secondary | ICD-10-CM | POA: Diagnosis present

## 2017-01-04 DIAGNOSIS — R001 Bradycardia, unspecified: Secondary | ICD-10-CM | POA: Diagnosis not present

## 2017-01-04 DIAGNOSIS — I509 Heart failure, unspecified: Secondary | ICD-10-CM

## 2017-01-04 DIAGNOSIS — I2510 Atherosclerotic heart disease of native coronary artery without angina pectoris: Secondary | ICD-10-CM

## 2017-01-04 DIAGNOSIS — E662 Morbid (severe) obesity with alveolar hypoventilation: Secondary | ICD-10-CM | POA: Diagnosis not present

## 2017-01-04 DIAGNOSIS — T380X5A Adverse effect of glucocorticoids and synthetic analogues, initial encounter: Secondary | ICD-10-CM | POA: Diagnosis not present

## 2017-01-04 DIAGNOSIS — R739 Hyperglycemia, unspecified: Secondary | ICD-10-CM

## 2017-01-04 DIAGNOSIS — I5021 Acute systolic (congestive) heart failure: Secondary | ICD-10-CM

## 2017-01-04 DIAGNOSIS — E785 Hyperlipidemia, unspecified: Secondary | ICD-10-CM | POA: Diagnosis present

## 2017-01-04 DIAGNOSIS — I2582 Chronic total occlusion of coronary artery: Secondary | ICD-10-CM | POA: Diagnosis present

## 2017-01-04 DIAGNOSIS — K219 Gastro-esophageal reflux disease without esophagitis: Secondary | ICD-10-CM | POA: Diagnosis present

## 2017-01-04 DIAGNOSIS — I480 Paroxysmal atrial fibrillation: Secondary | ICD-10-CM

## 2017-01-04 DIAGNOSIS — R0602 Shortness of breath: Secondary | ICD-10-CM | POA: Diagnosis not present

## 2017-01-04 DIAGNOSIS — R079 Chest pain, unspecified: Secondary | ICD-10-CM | POA: Diagnosis not present

## 2017-01-04 LAB — BASIC METABOLIC PANEL
Anion gap: 5 (ref 5–15)
BUN: 10 mg/dL (ref 6–20)
CHLORIDE: 99 mmol/L — AB (ref 101–111)
CO2: 36 mmol/L — ABNORMAL HIGH (ref 22–32)
CREATININE: 0.97 mg/dL (ref 0.44–1.00)
Calcium: 8.8 mg/dL — ABNORMAL LOW (ref 8.9–10.3)
GFR calc Af Amer: >60 mL/min (ref >60)
GFR calc non Af Amer: 60 mL/min — ABNORMAL LOW (ref >60)
GLUCOSE: 110 mg/dL — AB (ref 65–99)
POTASSIUM: 4 mmol/L (ref 3.5–5.1)
SODIUM: 140 mmol/L (ref 135–145)

## 2017-01-04 LAB — CBC
HCT: 45.3 % (ref 36.0–46.0)
HEMOGLOBIN: 14.5 g/dL (ref 12.0–15.0)
MCH: 30.3 pg (ref 26.0–34.0)
MCHC: 32 g/dL (ref 30.0–36.0)
MCV: 94.8 fL (ref 78.0–100.0)
Platelets: 241 10*3/uL (ref 150–400)
RBC: 4.78 MIL/uL (ref 3.87–5.11)
RDW: 13.6 % (ref 11.5–15.5)
WBC: 8.8 10*3/uL (ref 4.0–10.5)

## 2017-01-04 LAB — D-DIMER, QUANTITATIVE: D-Dimer, Quant: 0.3 ug/mL-FEU (ref 0.00–0.50)

## 2017-01-04 LAB — TROPONIN I: Troponin I: <0.03 ng/mL (ref <0.03)

## 2017-01-04 LAB — BRAIN NATRIURETIC PEPTIDE: B Natriuretic Peptide: 233 pg/mL — ABNORMAL HIGH (ref 0.0–100.0)

## 2017-01-04 MED ORDER — INFLUENZA VAC SPLIT QUAD 0.5 ML IM SUSY: 0.5000 mL | PREFILLED_SYRINGE | INTRAMUSCULAR | Status: DC

## 2017-01-04 MED ORDER — ORAL CARE MOUTH RINSE: 15.0000 mL | Freq: Two times a day (BID) | OROMUCOSAL | Status: DC

## 2017-01-04 MED ORDER — IPRATROPIUM-ALBUTEROL 0.5-2.5 (3) MG/3ML IN SOLN
3.0000 mL | Freq: Two times a day (BID) | RESPIRATORY_TRACT | Status: DC
  Administered 2017-01-05 – 2017-01-07 (×4): 3 mL via RESPIRATORY_TRACT
  Filled 2017-01-04 (×5): qty 3

## 2017-01-04 MED ORDER — ACETAMINOPHEN 325 MG PO TABS: 650.0000 mg | ORAL_TABLET | Freq: Four times a day (QID) | ORAL | Status: DC | PRN

## 2017-01-04 MED ORDER — IPRATROPIUM BROMIDE 0.02 % IN SOLN: 0.5000 mg | Freq: Four times a day (QID) | RESPIRATORY_TRACT | Status: DC

## 2017-01-04 MED ORDER — ACETAMINOPHEN 650 MG RE SUPP: 650.0000 mg | Freq: Four times a day (QID) | RECTAL | Status: DC | PRN

## 2017-01-04 MED ORDER — SODIUM CHLORIDE 0.9% FLUSH: 3.0000 mL | INTRAVENOUS | Status: DC | PRN

## 2017-01-04 MED ORDER — PNEUMOCOCCAL VAC POLYVALENT 25 MCG/0.5ML IJ INJ: 0.5000 mL | INJECTION | INTRAMUSCULAR | Status: DC

## 2017-01-04 MED ORDER — ONDANSETRON HCL 4 MG PO TABS
4.0000 mg | ORAL_TABLET | Freq: Four times a day (QID) | ORAL | Status: DC | PRN
Start: 2017-01-04 — End: 2017-01-13

## 2017-01-04 MED ORDER — ALBUTEROL SULFATE (2.5 MG/3ML) 0.083% IN NEBU: 2.5000 mg | INHALATION_SOLUTION | RESPIRATORY_TRACT | Status: DC | PRN

## 2017-01-04 MED ORDER — AZITHROMYCIN 250 MG PO TABS
500.0000 mg | ORAL_TABLET | Freq: Every day | ORAL | Status: DC
  Administered 2017-01-04 – 2017-01-08 (×5): 500 mg via ORAL
  Filled 2017-01-04 (×6): qty 2

## 2017-01-04 MED ORDER — METHYLPREDNISOLONE SODIUM SUCC 125 MG IJ SOLR
80.0000 mg | Freq: Once | INTRAMUSCULAR | Status: AC
  Administered 2017-01-04: 80 mg via INTRAVENOUS
  Filled 2017-01-04: qty 2

## 2017-01-04 MED ORDER — ALBUTEROL SULFATE (2.5 MG/3ML) 0.083% IN NEBU: 2.5000 mg | INHALATION_SOLUTION | Freq: Four times a day (QID) | RESPIRATORY_TRACT | Status: DC

## 2017-01-04 MED ORDER — IPRATROPIUM-ALBUTEROL 0.5-2.5 (3) MG/3ML IN SOLN
3.0000 mL | Freq: Four times a day (QID) | RESPIRATORY_TRACT | Status: DC
  Administered 2017-01-04: 3 mL via RESPIRATORY_TRACT
  Filled 2017-01-04: qty 3

## 2017-01-04 MED ORDER — SENNOSIDES-DOCUSATE SODIUM 8.6-50 MG PO TABS: 1.0000 | ORAL_TABLET | Freq: Every evening | ORAL | Status: DC | PRN

## 2017-01-04 MED ORDER — IPRATROPIUM-ALBUTEROL 0.5-2.5 (3) MG/3ML IN SOLN
3.0000 mL | Freq: Once | RESPIRATORY_TRACT | Status: AC
  Administered 2017-01-04: 3 mL via RESPIRATORY_TRACT
  Filled 2017-01-04: qty 3

## 2017-01-04 MED ORDER — ALBUTEROL SULFATE (2.5 MG/3ML) 0.083% IN NEBU
5.0000 mg | INHALATION_SOLUTION | Freq: Once | RESPIRATORY_TRACT | Status: AC
  Administered 2017-01-04: 5 mg via RESPIRATORY_TRACT
  Filled 2017-01-04: qty 6

## 2017-01-04 MED ORDER — ALBUTEROL SULFATE (2.5 MG/3ML) 0.083% IN NEBU: 5.0000 mg | INHALATION_SOLUTION | Freq: Once | RESPIRATORY_TRACT | Status: DC

## 2017-01-04 MED ORDER — IPRATROPIUM BROMIDE 0.02 % IN SOLN: 0.5000 mg | Freq: Once | RESPIRATORY_TRACT | Status: DC

## 2017-01-04 MED ORDER — SODIUM CHLORIDE 0.9 % IV SOLN: 250.0000 mL | INTRAVENOUS | Status: DC | PRN

## 2017-01-04 MED ORDER — ONDANSETRON HCL 4 MG/2ML IJ SOLN: 4.0000 mg | Freq: Four times a day (QID) | INTRAMUSCULAR | Status: DC | PRN

## 2017-01-04 MED ORDER — ENOXAPARIN SODIUM 40 MG/0.4ML ~~LOC~~ SOLN
40.0000 mg | SUBCUTANEOUS | Status: DC
  Administered 2017-01-04 – 2017-01-09 (×6): 40 mg via SUBCUTANEOUS
  Filled 2017-01-04 (×6): qty 0.4

## 2017-01-04 MED ORDER — ALBUTEROL SULFATE (2.5 MG/3ML) 0.083% IN NEBU
2.5000 mg | INHALATION_SOLUTION | Freq: Once | RESPIRATORY_TRACT | Status: AC
  Administered 2017-01-04: 2.5 mg via RESPIRATORY_TRACT
  Filled 2017-01-04: qty 3

## 2017-01-04 MED ORDER — SODIUM CHLORIDE 0.9% FLUSH
3.0000 mL | Freq: Two times a day (BID) | INTRAVENOUS | Status: DC
  Administered 2017-01-04 – 2017-01-11 (×12): 3 mL via INTRAVENOUS

## 2017-01-04 MED ORDER — PREDNISONE 20 MG PO TABS
60.0000 mg | ORAL_TABLET | Freq: Every day | ORAL | Status: DC
  Administered 2017-01-04 – 2017-01-08 (×5): 60 mg via ORAL
  Filled 2017-01-04 (×6): qty 3

## 2017-01-04 NOTE — ED Notes (Signed)
Attempted report, receiving unit reported would call back.

## 2017-01-04 NOTE — ED Provider Notes (Signed)
[facility] DEPT Provider Note   CSN: [id number] Arrival date & time: 01/04/17  1015  By signing my name below, I, Pamela Soto, attest that this documentation has been prepared under the direction and in the presence of Pamela Morrison, MD. Electronically Signed: Collene Soto, Scribe. 01/04/17. 11:03 AM.  History   Chief Complaint Chief Complaint  Patient presents with  . Shortness of Breath    HPI Comments: Pamela Soto is a 66 y.o. female with a hx of COPD, who presents to the Emergency Department complaining of shortness of breath that began 3 days ago. This is a new problem. Patient has associated wheezing and chest tightness. Patient is unsure if this is a COPD flare up. Patient states her shortness of breath is worse on exertion, wakes her up at night. Patient has pain in the RUQ when moving. Paitent is not on home oxygen. No modifying factors indicated. Patient denies any lower extremity edema, fever, chills, cough, abdominal pain, or weight gain.   The history is provided by the patient. No language interpreter was used.    Past Medical History:  Diagnosis Date  . Acid reflux     Patient Active Problem List   Diagnosis Date Noted  . S/P laparoscopic cholecystectomy + IOC Dec 2013 11/06/2012  . GASTROESOPHAGEAL REFLUX DISEASE, HX OF 06/05/2008  . UTI'S, HX OF 06/05/2008  . CHEST PAIN, HX OF 05/30/2008    Past Surgical History:  Procedure Laterality Date  . CESAREAN SECTION    . CHOLECYSTECTOMY  11/06/2012   Procedure: LAPAROSCOPIC CHOLECYSTECTOMY WITH INTRAOPERATIVE CHOLANGIOGRAM;  Surgeon: Pamela Earls, MD;  Location: [facility] ORS;  Service: General;  Laterality: N/A;  . UMBILICAL HERNIA REPAIR  11/06/2012   Procedure: HERNIA REPAIR UMBILICAL ADULT;  Surgeon: Pamela Earls, MD;  Location: [facility] OR[facility];  Service: General;;    OB History    No data available       Home Medications    Prior to Admission medications   Medication Sig Start Date End Date Taking?  Authorizing Provider  ibuprofen (ADVIL,MOTRIN) 200 MG tablet Take 400 mg by mouth 2 (two) times daily as needed for headache, mild pain or moderate pain (artheritic pain).   Yes Historical Provider, MD  metoprolol tartrate (LOPRESSOR) 25 MG tablet Take 1 tablet (25 mg total) by mouth 2 (two) times daily. Patient not taking: Reported on 01/04/2017 11/07/12   Iona Hansen, PA-C    Family History History reviewed. No pertinent family history.  Social History Social History  Substance Use Topics  . Smoking status: Never Smoker  . Smokeless tobacco: Never Used  . Alcohol use No     Allergies   Patient has no known allergies.   Review of Systems Review of Systems  Constitutional: Negative for chills and fever.  Respiratory: Positive for chest tightness and shortness of breath. Negative for cough.   Cardiovascular: Negative for chest pain.  Gastrointestinal: Negative for abdominal pain.  All other systems reviewed and are negative.    Physical Exam Updated Vital Signs BP 151/88   Pulse 94   Temp 98.5 F (36.9 C) (Oral)   Resp 25   Ht 5\' 5"  (1.651 m)   Wt 240 lb (108.9 kg)   SpO2 (!) 88%   BMI 39.94 kg/m   Physical Exam  Constitutional: She is oriented to person, place, and time. She appears well-developed.  HENT:  Head: Normocephalic and atraumatic.  Mouth/Throat: Oropharynx is clear and moist.  Eyes: Conjunctivae and EOM are normal.  Pupils are equal, round, and reactive to light.  Neck: Normal range of motion. Neck supple.  Cardiovascular: Normal rate, regular rhythm and normal heart sounds.   No murmur heard. No significant leg swelling.   Pulmonary/Chest: Effort normal. No respiratory distress. She has wheezes. She has no rales.  Expiratory wheezing bilaterally, also present in the back.   Mild increased work of breathing.   Abdominal: Soft. Bowel sounds are normal. There is no tenderness.  Musculoskeletal: Normal range of motion.  Neurological: She is alert  and oriented to person, place, and time.  Skin: Skin is warm and dry.  Psychiatric: She has a normal mood and affect.    ED Treatments / Results  DIAGNOSTIC STUDIES: Oxygen Saturation is 91% on RA, low by my interpretation.    COORDINATION OF CARE: 11:02 AM Discussed treatment plan with pt at bedside and pt agreed to plan, which includes a breathing treatment.   Labs (all labs ordered are listed, but only abnormal results are displayed) Labs Reviewed  BASIC METABOLIC PANEL - Abnormal; Notable for the following:       Result Value   Chloride 99 (*)    CO2 36 (*)    Glucose, Bld 110 (*)    Calcium 8.8 (*)    GFR calc non Af Amer 60 (*)    All other components within normal limits  BRAIN NATRIURETIC PEPTIDE - Abnormal; Notable for the following:    B Natriuretic Peptide 233.0 (*)    All other components within normal limits  CBC  TROPONIN I  D-DIMER, QUANTITATIVE (NOT AT [facility])    EKG  EKG Interpretation  Date/Time:  Tuesday January 04 2017 10:32:09 EST Ventricular Rate:  99 PR Interval:    QRS Duration: 102 QT Interval:  382 QTC Calculation: 491 R Axis:   -39 Text Interpretation:  Sinus rhythm Left ventricular hypertrophy Nonspecific T abnormalities, lateral leads Borderline prolonged QT interval Poor baseline Confirmed by Reather Converse MD, Boaz Berisha [phone]) on 01/04/2017 10:50:47 AM       Radiology Dg Chest 2 View  Result Date: 01/04/2017 CLINICAL DATA:  Shortness of breath for 2-3 days, some chest pain EXAM: CHEST  2 VIEW COMPARISON:  Chest x-ray 10/13/2012 FINDINGS: No active infiltrate or effusion is seen. Mediastinal and hilar contours are unremarkable. Cardiomegaly is stable. No acute bony abnormality is seen. IMPRESSION: Stable cardiomegaly.  No active lung disease. Electronically Signed   By: Ivar Drape M.D.   On: 01/04/2017 11:16    Procedures Procedures (including critical care time)  Medications Ordered in ED Medications  albuterol (PROVENTIL) (2.5 MG/3ML)  0.083% nebulizer solution 5 mg (not administered)  methylPREDNISolone sodium succinate (SOLU-MEDROL) 125 mg/2 mL injection 80 mg (80 mg Intravenous Given 01/04/17 1130)  ipratropium-albuterol (DUONEB) 0.5-2.5 (3) MG/3ML nebulizer solution 3 mL (3 mLs Nebulization Given 01/04/17 1142)  albuterol (PROVENTIL) (2.5 MG/3ML) 0.083% nebulizer solution 2.5 mg (2.5 mg Nebulization Given by Other 01/04/17 1141)     Initial Impression / Assessment and Plan / ED Course  I have reviewed the triage vital signs and the nursing notes.  Pertinent labs & imaging results that were available during my care of the patient were reviewed by me and considered in my medical decision making (see chart for details).   patient presents with clinical concern for acute COPD exacerbation however more severe than she's ever had. Patient low risk blood clot d-dimer negative. Patient's oxygen with ambulation 88%. Patient more comfortable on 1 L and she is not on home  oxygen. Plan for telemetry observation for COPD exacerbation and further workup.  The patients results and plan were reviewed and discussed.   Any x-rays performed were independently reviewed by myself.   Differential diagnosis were considered with the presenting HPI.  Medications  albuterol (PROVENTIL) (2.5 MG/3ML) 0.083% nebulizer solution 5 mg (not administered)  methylPREDNISolone sodium succinate (SOLU-MEDROL) 125 mg/2 mL injection 80 mg (80 mg Intravenous Given 01/04/17 1130)  ipratropium-albuterol (DUONEB) 0.5-2.5 (3) MG/3ML nebulizer solution 3 mL (3 mLs Nebulization Given 01/04/17 1142)  albuterol (PROVENTIL) (2.5 MG/3ML) 0.083% nebulizer solution 2.5 mg (2.5 mg Nebulization Given by Other 01/04/17 1141)    Vitals:   01/04/17 1045 01/04/17 1054 01/04/17 1130 01/04/17 1142  BP:   151/88   Pulse: 95 94    Resp: 26 20 25    Temp:      TempSrc:      SpO2: 94% 98%  (!) 88%  Weight:      Height:        Final diagnoses:  Acute exacerbation of chronic  obstructive pulmonary disease (COPD) ([place])    Admission/ observation were discussed with the admitting physician, patient and/or family and they are comfortable with the plan.   Final Clinical Impressions(s) / ED Diagnoses   Final diagnoses:  Acute exacerbation of chronic obstructive pulmonary disease (COPD) ([place])    New Prescriptions New Prescriptions   No medications on file       Pamela Morrison, MD 01/04/17 1237

## 2017-01-04 NOTE — ED Notes (Signed)
Ambulated patient. Pt denies pain. Moderate dyspnea with exertion. Respirations ranged from 29-34. Pulse ox 92-93%. EDP aware and reported would place order for neb treatment and consult hospitalist.

## 2017-01-04 NOTE — ED Triage Notes (Signed)
Pt reports chest tightness radiating to back. Pt reports increased shortness of breath with walking and at night. Pt reports intermittent wheezing for last several days. Pt alert and oriented. Moderate dyspnea with exertion. Airway patent.

## 2017-01-04 NOTE — H&P (Signed)
History and Physical    Pamela Soto [id number] DOB: 31-Mar-1951 DOA: 01/04/2017  Referring MD/NP/PA: [name], EDP PCP: No PCP Per Patient  Patient coming from: Home  Chief Complaint: Shortness of breath  HPI: Pamela Soto is a 66 y.o. female with history of COPD and GERD who presents to the hospital today with increased shortness of breath above her baseline. She does not use oxygen at home. Was found to be in the upper 80s during ambulation in the emergency department. She was giving breathing treatments and steroids, however it was felt by EDP that she was not significantly improved yet and was recommended admission for observation for further evaluation and management. Labs are essentially within normal limits, chest x-ray without active disease. She has not had fever, chills, chest pain, runny nose, shortness of breath. She has not traveled recently or had any sick contacts.  Past Medical/Surgical History: Past Medical History:  Diagnosis Date  . Acid reflux     Past Surgical History:  Procedure Laterality Date  . CESAREAN SECTION    . CHOLECYSTECTOMY  11/06/2012   Procedure: LAPAROSCOPIC CHOLECYSTECTOMY WITH INTRAOPERATIVE CHOLANGIOGRAM;  Surgeon: Pedro Earls, MD;  Location: [facility];  Service: General;  Laterality: N/A;  . UMBILICAL HERNIA REPAIR  11/06/2012   Procedure: HERNIA REPAIR UMBILICAL ADULT;  Surgeon: Pedro Earls, MD;  Location: [facility];  Service: General;;    Social History:  reports that she has never smoked. She has never used smokeless tobacco. She reports that she does not drink alcohol or use drugs.  Allergies: No Known Allergies  Family History:  No history of MI or stroke  Prior to Admission medications   Medication Sig Start Date End Date Taking? Authorizing Provider  ibuprofen (ADVIL,MOTRIN) 200 MG tablet Take 400 mg by mouth 2 (two) times daily as needed for headache, mild pain or moderate pain (artheritic pain).   Yes Historical  Provider, MD  metoprolol tartrate (LOPRESSOR) 25 MG tablet Take 1 tablet (25 mg total) by mouth 2 (two) times daily. Patient not taking: Reported on 01/04/2017 11/07/12   Iona Hansen, PA-C    Review of Systems:  Constitutional: Denies fever, chills, diaphoresis, appetite change and fatigue.  HEENT: Denies photophobia, eye pain, redness, hearing loss, ear pain, congestion, sore throat, rhinorrhea, sneezing, mouth sores, trouble swallowing, neck pain, neck stiffness and tinnitus.   Respiratory: Denies SOB, DOE, cough, chest tightness,  and wheezing.   Cardiovascular: Denies chest pain, palpitations and leg swelling.  Gastrointestinal: Denies nausea, vomiting, abdominal pain, diarrhea, constipation, blood in stool and abdominal distention.  Genitourinary: Denies dysuria, urgency, frequency, hematuria, flank pain and difficulty urinating.  Endocrine: Denies: hot or cold intolerance, sweats, changes in hair or nails, polyuria, polydipsia. Musculoskeletal: Denies myalgias, back pain, joint swelling, arthralgias and gait problem.  Skin: Denies pallor, rash and wound.  Neurological: Denies dizziness, seizures, syncope, weakness, light-headedness, numbness and headaches.  Hematological: Denies adenopathy. Easy bruising, personal or family bleeding history  Psychiatric/Behavioral: Denies suicidal ideation, mood changes, confusion, nervousness, sleep disturbance and agitation    Physical Exam: Vitals:   01/04/17 1500 01/04/17 1515 01/04/17 1540 01/04/17 1613  BP: 166/96   (!) 159/79  Pulse:  104  (!) 107  Resp: 20 17  (!) 22  Temp:   98.4 F (36.9 C) 97.6 F (36.4 C)  TempSrc:   Oral Oral  SpO2:  94%  91%  Weight:    115.5 kg (254 lb 11.2 oz)  Height:    5\' 5"  (  1.651 m)     Constitutional: NAD, calm, comfortable Eyes: PERRL, lids and conjunctivae normal ENMT: Mucous membranes are moist. Posterior pharynx clear of any exudate or lesions.Normal dentition.  Neck: normal, supple, no  masses, no thyromegaly Respiratory: clear to auscultation bilaterally, no wheezing, no crackles. Normal respiratory effort. No accessory muscle use.  Cardiovascular: Regular rate and rhythm, no murmurs / rubs / gallops. No extremity edema. 2+ pedal pulses. No carotid bruits.  Abdomen: no tenderness, no masses palpated. No hepatosplenomegaly. Bowel sounds positive.  Musculoskeletal: no clubbing / cyanosis. No joint deformity upper and lower extremities. Good ROM, no contractures. Normal muscle tone.  Skin: no rashes, lesions, ulcers. No induration Neurologic: CN 2-12 grossly intact. Sensation intact, DTR normal. Strength 5/5 in all 4.  Psychiatric: Normal judgment and insight. Alert and oriented x 3. Normal mood.    Labs on Admission: I have personally reviewed the following labs and imaging studies  CBC:  Recent Labs Lab 01/04/17 1049  WBC 8.8  HGB 14.5  HCT 45.3  MCV 94.8  PLT [id number]   Basic Metabolic Panel:  Recent Labs Lab 01/04/17 1049  NA 140  K 4.0  CL 99*  CO2 36*  GLUCOSE 110*  BUN 10  CREATININE 0.97  CALCIUM 8.8*   GFR: Estimated Creatinine Clearance: 73.4 mL/min (by C-G formula based on SCr of 0.97 mg/dL). Liver Function Tests: No results for input(s): AST, ALT, ALKPHOS, BILITOT, PROT, ALBUMIN in the last 168 hours. No results for input(s): LIPASE, AMYLASE in the last 168 hours. No results for input(s): AMMONIA in the last 168 hours. Coagulation Profile: No results for input(s): INR, PROTIME in the last 168 hours. Cardiac Enzymes:  Recent Labs Lab 01/04/17 1049  TROPONINI <0.03   BNP (last 3 results) No results for input(s): PROBNP in the last 8760 hours. HbA1C: No results for input(s): HGBA1C in the last 72 hours. CBG: No results for input(s): GLUCAP in the last 168 hours. Lipid Profile: No results for input(s): CHOL, HDL, LDLCALC, TRIG, CHOLHDL, LDLDIRECT in the last 72 hours. Thyroid Function Tests: No results for input(s): TSH, T4TOTAL, FREET4,  T3FREE, THYROIDAB in the last 72 hours. Anemia Panel: No results for input(s): VITAMINB12, FOLATE, FERRITIN, TIBC, IRON, RETICCTPCT in the last 72 hours. Urine analysis:    Component Value Date/Time   COLORURINE AMBER BIOCHEMICALS MAY BE AFFECTED BY COLOR (A) 12/12/2010 0207   APPEARANCEUR CLOUDY (A) 12/12/2010 0207   LABSPEC 1.020 12/12/2010 0207   PHURINE 6.0 12/12/2010 0207   GLUCOSEU NEGATIVE 05/30/2008 2053   HGBUR TRACE (A) 12/12/2010 0207   BILIRUBINUR SMALL (A) 12/12/2010 0207   KETONESUR NEGATIVE 12/12/2010 0207   PROTEINUR 30 (A) 12/12/2010 0207   UROBILINOGEN 1.0 12/12/2010 0207   NITRITE NEGATIVE 12/12/2010 0207   LEUKOCYTESUR NEGATIVE 12/12/2010 0207   Sepsis Labs: @LABRCNTIP (procalcitonin:4,lacticidven:4) )No results found for this or any previous visit (from the past 240 hour(s)).   Radiological Exams on Admission: Dg Chest 2 View  Result Date: 01/04/2017 CLINICAL DATA:  Shortness of breath for 2-3 days, some chest pain EXAM: CHEST  2 VIEW COMPARISON:  Chest x-ray 10/13/2012 FINDINGS: No active infiltrate or effusion is seen. Mediastinal and hilar contours are unremarkable. Cardiomegaly is stable. No acute bony abnormality is seen. IMPRESSION: Stable cardiomegaly.  No active lung disease. Electronically Signed   By: Ivar Drape M.D.   On: 01/04/2017 11:16    EKG: Independently reviewed. Sinus rhythm with nonspecific ST changes  Assessment/Plan Principal Problem:   COPD exacerbation ([place])  Acute hypoxemic respiratory failure -Due to COPD with acute exacerbation. -By the time I am assessing her on the floor, she has no wheezing and states her shortness of breath has improved. -She has not had cough, fever, malaise. I am not suspicious for the flu or any superimposed infection. -We'll treat her with oral prednisone, empiric antibiotics, breathing treatments. -Suspect she can be discharged home in 24 hours.   DVT prophylaxis: Lovenox  Code Status: Full code    Family Communication: Sister at bedside updated on plan of care and questions answered  Disposition Plan: Home in 24 hours anticipated  Consults called: None  Admission status: Observation    Time Spent: 65 minutes  Lelon Frohlich MD [facility] Pager [phone]  If 7PM-7AM, please contact night-coverage [email or web address] Password [email or web address]  01/04/2017, 5:33 PM

## 2017-01-04 NOTE — ED Notes (Addendum)
Attempted to call report. Phone call was disconnected.

## 2017-01-05 DIAGNOSIS — I272 Pulmonary hypertension, unspecified: Secondary | ICD-10-CM | POA: Diagnosis present

## 2017-01-05 DIAGNOSIS — I25110 Atherosclerotic heart disease of native coronary artery with unstable angina pectoris: Secondary | ICD-10-CM | POA: Diagnosis not present

## 2017-01-05 DIAGNOSIS — E785 Hyperlipidemia, unspecified: Secondary | ICD-10-CM | POA: Diagnosis present

## 2017-01-05 DIAGNOSIS — I429 Cardiomyopathy, unspecified: Secondary | ICD-10-CM | POA: Diagnosis not present

## 2017-01-05 DIAGNOSIS — K219 Gastro-esophageal reflux disease without esophagitis: Secondary | ICD-10-CM | POA: Diagnosis present

## 2017-01-05 DIAGNOSIS — R739 Hyperglycemia, unspecified: Secondary | ICD-10-CM

## 2017-01-05 DIAGNOSIS — I5021 Acute systolic (congestive) heart failure: Secondary | ICD-10-CM | POA: Diagnosis not present

## 2017-01-05 DIAGNOSIS — I2582 Chronic total occlusion of coronary artery: Secondary | ICD-10-CM | POA: Diagnosis present

## 2017-01-05 DIAGNOSIS — I480 Paroxysmal atrial fibrillation: Secondary | ICD-10-CM | POA: Diagnosis not present

## 2017-01-05 DIAGNOSIS — E662 Morbid (severe) obesity with alveolar hypoventilation: Secondary | ICD-10-CM | POA: Diagnosis present

## 2017-01-05 DIAGNOSIS — Z79899 Other long term (current) drug therapy: Secondary | ICD-10-CM | POA: Diagnosis not present

## 2017-01-05 DIAGNOSIS — Z6841 Body Mass Index (BMI) 40.0 and over, adult: Secondary | ICD-10-CM | POA: Diagnosis not present

## 2017-01-05 DIAGNOSIS — I509 Heart failure, unspecified: Secondary | ICD-10-CM | POA: Diagnosis not present

## 2017-01-05 DIAGNOSIS — J441 Chronic obstructive pulmonary disease with (acute) exacerbation: Secondary | ICD-10-CM | POA: Diagnosis not present

## 2017-01-05 DIAGNOSIS — J9601 Acute respiratory failure with hypoxia: Secondary | ICD-10-CM

## 2017-01-05 DIAGNOSIS — J9600 Acute respiratory failure, unspecified whether with hypoxia or hypercapnia: Secondary | ICD-10-CM | POA: Diagnosis not present

## 2017-01-05 DIAGNOSIS — I428 Other cardiomyopathies: Secondary | ICD-10-CM | POA: Diagnosis not present

## 2017-01-05 DIAGNOSIS — I2510 Atherosclerotic heart disease of native coronary artery without angina pectoris: Secondary | ICD-10-CM | POA: Diagnosis not present

## 2017-01-05 DIAGNOSIS — R7302 Impaired glucose tolerance (oral): Secondary | ICD-10-CM | POA: Diagnosis not present

## 2017-01-05 DIAGNOSIS — D72829 Elevated white blood cell count, unspecified: Secondary | ICD-10-CM | POA: Diagnosis not present

## 2017-01-05 DIAGNOSIS — I5043 Acute on chronic combined systolic (congestive) and diastolic (congestive) heart failure: Secondary | ICD-10-CM | POA: Diagnosis present

## 2017-01-05 DIAGNOSIS — I255 Ischemic cardiomyopathy: Secondary | ICD-10-CM | POA: Diagnosis present

## 2017-01-05 DIAGNOSIS — R001 Bradycardia, unspecified: Secondary | ICD-10-CM | POA: Diagnosis not present

## 2017-01-05 DIAGNOSIS — R7303 Prediabetes: Secondary | ICD-10-CM | POA: Diagnosis present

## 2017-01-05 DIAGNOSIS — T380X5A Adverse effect of glucocorticoids and synthetic analogues, initial encounter: Secondary | ICD-10-CM | POA: Diagnosis not present

## 2017-01-05 LAB — BASIC METABOLIC PANEL
Anion gap: 7 (ref 5–15)
BUN: 11 mg/dL (ref 6–20)
CALCIUM: 8.6 mg/dL — AB (ref 8.9–10.3)
CO2: 33 mmol/L — ABNORMAL HIGH (ref 22–32)
CREATININE: 0.85 mg/dL (ref 0.44–1.00)
Chloride: 99 mmol/L — ABNORMAL LOW (ref 101–111)
Glucose, Bld: 152 mg/dL — ABNORMAL HIGH (ref 65–99)
Potassium: 4.4 mmol/L (ref 3.5–5.1)
SODIUM: 139 mmol/L (ref 135–145)

## 2017-01-05 LAB — CBC
HCT: 44.9 % (ref 36.0–46.0)
Hemoglobin: 14.1 g/dL (ref 12.0–15.0)
MCH: 29.7 pg (ref 26.0–34.0)
MCHC: 31.4 g/dL (ref 30.0–36.0)
MCV: 94.5 fL (ref 78.0–100.0)
PLATELETS: 243 10*3/uL (ref 150–400)
RBC: 4.75 MIL/uL (ref 3.87–5.11)
RDW: 13.7 % (ref 11.5–15.5)
WBC: 13.1 10*3/uL — AB (ref 4.0–10.5)

## 2017-01-05 LAB — BRAIN NATRIURETIC PEPTIDE: B NATRIURETIC PEPTIDE 5: 259 pg/mL — AB (ref 0.0–100.0)

## 2017-01-05 MED ORDER — FUROSEMIDE 10 MG/ML IJ SOLN
20.0000 mg | Freq: Every day | INTRAMUSCULAR | Status: DC
  Administered 2017-01-05 – 2017-01-06 (×2): 20 mg via INTRAVENOUS
  Filled 2017-01-05 (×2): qty 2

## 2017-01-05 NOTE — Progress Notes (Signed)
PROGRESS NOTE  Pamela Soto [id number] DOB: 1951-03-23 DOA: 01/04/2017 PCP: No PCP Per Patient  Brief History:  66 year old female with a history of COPD, GERD, morbid obesity presents with a 2 year history of shortness breath that has worsened over 2 weeks prior to admission. The patient was noted to be wheezing and hypoxemic in the emergency department with saturation in the upper 80s. She was given bronchodilators and IV steroids with some improvement in the ED, but the patient remains dyspneic on exertion. The patient denied any worsening lower extremity edema, but complained of increasing abdominal girth. She denied any fevers, chills, chest pain, nausea, vomiting, diarrhea.  Assessment/Plan: Acute respiratory failure with hypoxia -Multifactorial including COPD exacerbation,OSA/OHS, CHF, and deconditioning -Continue duo nebs -Continue prednisone -Flutter valve -D-Dimer--0.30 -stable on 4 L [place] presently  COPD exacerbation -As above -Continue azithromycin  Acute CHF -The patient is clinically fluid overloaded with JVD and pulm vasc congestion on CXR -start lasix IV -Echo -daily weights -accurate I/Os  Morbid obesity -BMI 42.5 -lifestyle modification  Hyperglycemia -check A1C    Disposition Plan:   Home in 1-2 days  Family Communication:   Family at bedside  Consultants:  none  Code Status:  FULL  DVT Prophylaxis:   [place] Lovenox   Procedures: As Listed in Progress Note Above  Antibiotics: None    Subjective: Overall patient is breathing better but still has dyspnea on exertion. Denies any fevers, chills, chest pain, nausea, vomiting, diarrhea, abdominal pain, dysuria, hematuria. No rashes.  Objective: Vitals:   01/05/17 0903 01/05/17 1049 01/05/17 1442 01/05/17 1508  BP:   (!) 150/76   Pulse:  89 (!) 107   Resp:   (!) 22   Temp:   98.6 F (37 C)   TempSrc:      SpO2: (S) (!) 85% 97% 92% 94%  Weight:      Height:        Intake/Output  Summary (Last 24 hours) at 01/05/17 1714 Last data filed at 01/05/17 1300  Gross per 24 hour  Intake              480 ml  Output                0 ml  Net              480 ml   Weight change:  Exam:   General:  Pt is alert, follows commands appropriately, not in acute distress  HEENT: No icterus, No thrush, No neck mass, [place]/AT  Cardiovascular: RRR, S1/S2, no rubs, no gallops+JVD  Respiratory: Bibasilar crackles. No wheezing. Good air movement.  Abdomen: Soft/+BS, non tender, non distended, no guarding  Extremities: 1 + LE edema, No lymphangitis, No petechiae, No rashes, no synovitis   Data Reviewed: I have personally reviewed following labs and imaging studies Basic Metabolic Panel:  Recent Labs Lab 01/04/17 1049 01/05/17 0438  NA 140 139  K 4.0 4.4  CL 99* 99*  CO2 36* 33*  GLUCOSE 110* 152*  BUN 10 11  CREATININE 0.97 0.85  CALCIUM 8.8* 8.6*   Liver Function Tests: No results for input(s): AST, ALT, ALKPHOS, BILITOT, PROT, ALBUMIN in the last 168 hours. No results for input(s): LIPASE, AMYLASE in the last 168 hours. No results for input(s): AMMONIA in the last 168 hours. Coagulation Profile: No results for input(s): INR, PROTIME in the last 168 hours. CBC:  Recent Labs Lab 01/04/17 1049 01/05/17  0438  WBC 8.8 13.1*  HGB 14.5 14.1  HCT 45.3 44.9  MCV 94.8 94.5  PLT 241 243   Cardiac Enzymes:  Recent Labs Lab 01/04/17 1049  TROPONINI <0.03   BNP: Invalid input(s): POCBNP CBG: No results for input(s): GLUCAP in the last 168 hours. HbA1C: No results for input(s): HGBA1C in the last 72 hours. Urine analysis:    Component Value Date/Time   COLORURINE AMBER BIOCHEMICALS MAY BE AFFECTED BY COLOR (A) 12/12/2010 0207   APPEARANCEUR CLOUDY (A) 12/12/2010 0207   LABSPEC 1.020 12/12/2010 0207   PHURINE 6.0 12/12/2010 0207   GLUCOSEU NEGATIVE 05/30/2008 2053   HGBUR TRACE (A) 12/12/2010 0207   BILIRUBINUR SMALL (A) 12/12/2010 0207   KETONESUR  NEGATIVE 12/12/2010 0207   PROTEINUR 30 (A) 12/12/2010 0207   UROBILINOGEN 1.0 12/12/2010 0207   NITRITE NEGATIVE 12/12/2010 0207   LEUKOCYTESUR NEGATIVE 12/12/2010 0207   Sepsis Labs: @LABRCNTIP (procalcitonin:4,lacticidven:4) )No results found for this or any previous visit (from the past 240 hour(s)).   Scheduled Meds: . azithromycin  500 mg Oral Daily  . enoxaparin (LOVENOX) injection  40 mg Subcutaneous Q24H  . furosemide  20 mg Intravenous Daily  . ipratropium-albuterol  3 mL Nebulization BID  . predniSONE  60 mg Oral Q breakfast  . sodium chloride flush  3 mL Intravenous Q12H   Continuous Infusions:  Procedures/Studies: Dg Chest 2 View  Result Date: 01/04/2017 CLINICAL DATA:  Shortness of breath for 2-3 days, some chest pain EXAM: CHEST  2 VIEW COMPARISON:  Chest x-ray 10/13/2012 FINDINGS: No active infiltrate or effusion is seen. Mediastinal and hilar contours are unremarkable. Cardiomegaly is stable. No acute bony abnormality is seen. IMPRESSION: Stable cardiomegaly.  No active lung disease. Electronically Signed   By: Ivar Drape M.D.   On: 01/04/2017 11:16    [name], DO  [facility] Pager [phone]  If 7PM-7AM, please contact night-coverage [email or web address] Password [email or web address] 01/05/2017, 5:14 PM   LOS: 0 days

## 2017-01-05 NOTE — Care Management Obs Status (Signed)
[place] NOTIFICATION   Patient Details  Name: Tahina Mckane MRN: [id number] Date of Birth: 11/26/1951   Medicare Observation Status Notification Given:  Yes    Mordecai Tindol, Chauncey Reading, RN 01/05/2017, 11:28 AM

## 2017-01-05 NOTE — Care Management Note (Signed)
Case Management Note  Patient Details  Name: Pamela Soto MRN: [id number] Date of Birth: Jun 09, 1951  Subjective/Objective:   Patient adm from home with COPD exacerbation. Ind with ADL's, no HH/DME PTA. Currently on 3L of oxygen, will need to be weaned if possible. She reports no PCP, but has transportation and reports no issues affording medications.                  Action/Plan: CM will follow for oxygen needs and give list of providers for patient to seek primary care.   Expected Discharge Date:    01/06/2017              Expected Discharge Plan:  Home/Self Care  In-House Referral:  NA  Discharge planning Services  CM Consult  Post Acute Care Choice:  NA Choice offered to:  NA  DME Arranged:    DME Agency:     HH Arranged:    HH Agency:     Status of Service:  In process, will continue to follow  If discussed at Long Length of Stay Meetings, dates discussed:    Additional Comments:  Sumaya Riedesel, Chauncey Reading, RN 01/05/2017, 11:24 AM

## 2017-01-06 ENCOUNTER — Inpatient Hospital Stay (HOSPITAL_COMMUNITY): Payer: Medicare Other

## 2017-01-06 DIAGNOSIS — J9600 Acute respiratory failure, unspecified whether with hypoxia or hypercapnia: Secondary | ICD-10-CM

## 2017-01-06 DIAGNOSIS — I509 Heart failure, unspecified: Secondary | ICD-10-CM

## 2017-01-06 DIAGNOSIS — R7309 Other abnormal glucose: Secondary | ICD-10-CM

## 2017-01-06 DIAGNOSIS — I5021 Acute systolic (congestive) heart failure: Secondary | ICD-10-CM

## 2017-01-06 LAB — ECHOCARDIOGRAM COMPLETE
AVLVOTPG: 6 mmHg
E decel time: 183 msec
FS: 17 % — AB (ref 28–44)
Height: 65 in
IVS/LV PW RATIO, ED: 0.97
LA diam end sys: 43 mm
LA vol index: 34.9 mL/m2
LADIAMINDEX: 1.82 cm/m2
LASIZE: 43 mm
LAVOL: 82.5 mL
LAVOLA4C: 77.7 mL
LDCA: 3.8 cm2
LV dias vol index: 54 mL/m2
LVDIAVOL: 128 mL — AB (ref 46–106)
LVOT SV: 87 mL
LVOT VTI: 23 cm
LVOT diameter: 22 mm
LVOT peak vel: 121 cm/s
LVSYSVOL: 77 mL — AB (ref 14–42)
LVSYSVOLIN: 33 mL/m2
MV Dec: 183
MVPG: 8 mmHg
MVPKEVEL: 139 m/s
PW: 15.3 mm — AB (ref 0.6–1.1)
RV TAPSE: 24.9 mm
Simpson's disk: 40
Stroke v: 51 ml
WEIGHTICAEL: 4075.2 [oz_av]

## 2017-01-06 LAB — BASIC METABOLIC PANEL
Anion gap: 6 (ref 5–15)
BUN: 20 mg/dL (ref 6–20)
CHLORIDE: 97 mmol/L — AB (ref 101–111)
CO2: 38 mmol/L — AB (ref 22–32)
CREATININE: 1.02 mg/dL — AB (ref 0.44–1.00)
Calcium: 8.3 mg/dL — ABNORMAL LOW (ref 8.9–10.3)
GFR calc Af Amer: >60 mL/min (ref >60)
GFR calc non Af Amer: 56 mL/min — ABNORMAL LOW (ref >60)
Glucose, Bld: 104 mg/dL — ABNORMAL HIGH (ref 65–99)
POTASSIUM: 4.5 mmol/L (ref 3.5–5.1)
Sodium: 141 mmol/L (ref 135–145)

## 2017-01-06 LAB — MAGNESIUM: Magnesium: 2.2 mg/dL (ref 1.7–2.4)

## 2017-01-06 MED ORDER — FUROSEMIDE 20 MG PO TABS
20.0000 mg | ORAL_TABLET | Freq: Every day | ORAL | Status: DC
  Administered 2017-01-07 – 2017-01-08 (×2): 20 mg via ORAL
  Filled 2017-01-06 (×2): qty 1

## 2017-01-06 NOTE — Progress Notes (Signed)
*  PRELIMINARY RESULTS* Echocardiogram 2D Echocardiogram has been performed.  Pamela Soto 01/06/2017, 10:07 AM

## 2017-01-06 NOTE — Progress Notes (Addendum)
PROGRESS NOTE  Pamela Soto [id number] DOB: June 03, 1951 DOA: 01/04/2017 PCP: No PCP Per Patient   Brief History:  66 year old female with a history of COPD, GERD, morbid obesity presents with a 2 year history of shortness breath that has worsened over 2 weeks prior to admission. The patient was noted to be wheezing and hypoxemic in the emergency department with saturation in the upper 80s. She was given bronchodilators and IV steroids with some improvement in the ED, but the patient remains dyspneic on exertion. The patient denied any worsening lower extremity edema, but complained of increasing abdominal girth. She denied any fevers, chills, chest pain, nausea, vomiting, diarrhea.  Assessment/Plan: Acute respiratory failure with hypoxia -Multifactorial including COPD exacerbation,OSA/OHS, CHF/cardiomyopathy, and deconditioning -Continue duo nebs -Continue prednisone--start taper -Flutter valve -D-Dimer--0.30 -stable on 4 L-->2L [place] presently -pt may have to go home on oxygen  COPD exacerbation -As above -Continue azithromycin  Acute systolic CHF -The patient is clinically fluid overloaded with JVD and pulm vasc congestion on CXR -IV Lasix-->po lasix in am -Echo--EF 35-40 percent, diffuse HK, mild MR -consult cardiology -daily weights -accurate I/Os -repeat CXR in am -start bisoprolol if BP able to tolerate  Morbid obesity -BMI 42.5 -lifestyle modification  Hyperglycemia -check A1C--pending    Disposition Plan:   Home in 1-2 days  Family Communication:   No Family at bedside  Consultants:  cardiology  Code Status:  FULL  DVT Prophylaxis:   [place] Lovenox   Procedures: As Listed in Progress Note Above  Antibiotics: None    Subjective: Patient states that her breathing is even better than yesterday. She states that she still has some mild dyspnea on exertion but it is improving. Denies any fevers, chills, chest pain, dizziness, nausea,  vomiting, diarrhea, abdominal pain. No dysuria.  Objective: Vitals:   01/05/17 2100 01/06/17 0613 01/06/17 0829 01/06/17 1500  BP:  (!) 99/57  100/61  Pulse:  98  87  Resp:  18  18  Temp:  98.5 F (36.9 C)  98.3 F (36.8 C)  TempSrc:  Oral    SpO2: 92% 95% 95% 96%  Weight:      Height:        Intake/Output Summary (Last 24 hours) at 01/06/17 1659 Last data filed at 01/06/17 1500  Gross per 24 hour  Intake              243 ml  Output             1200 ml  Net             -957 ml   Weight change:  Exam:   General:  Pt is alert, follows commands appropriately, not in acute distress  HEENT: No icterus, No thrush, No neck mass, [place]/AT  Cardiovascular: RRR, S1/S2, no rubs, no gallops  Respiratory: Fine bibasilar crackles. No wheeze. Good air movement  Abdomen: Soft/+BS, non tender, non distended, no guarding  Extremities: trace LE edema, No lymphangitis, No petechiae, No rashes, no synovitis   Data Reviewed: I have personally reviewed following labs and imaging studies Basic Metabolic Panel:  Recent Labs Lab 01/04/17 1049 01/05/17 0438 01/06/17 0603  NA 140 139 141  K 4.0 4.4 4.5  CL 99* 99* 97*  CO2 36* 33* 38*  GLUCOSE 110* 152* 104*  BUN 10 11 20   CREATININE 0.97 0.85 1.02*  CALCIUM 8.8* 8.6* 8.3*  MG  --   --  2.2  Liver Function Tests: No results for input(s): AST, ALT, ALKPHOS, BILITOT, PROT, ALBUMIN in the last 168 hours. No results for input(s): LIPASE, AMYLASE in the last 168 hours. No results for input(s): AMMONIA in the last 168 hours. Coagulation Profile: No results for input(s): INR, PROTIME in the last 168 hours. CBC:  Recent Labs Lab 01/04/17 1049 01/05/17 0438  WBC 8.8 13.1*  HGB 14.5 14.1  HCT 45.3 44.9  MCV 94.8 94.5  PLT 241 243   Cardiac Enzymes:  Recent Labs Lab 01/04/17 1049  TROPONINI <0.03   BNP: Invalid input(s): POCBNP CBG: No results for input(s): GLUCAP in the last 168 hours. HbA1C: No results for input(s):  HGBA1C in the last 72 hours. Urine analysis:    Component Value Date/Time   COLORURINE AMBER BIOCHEMICALS MAY BE AFFECTED BY COLOR (A) 12/12/2010 0207   APPEARANCEUR CLOUDY (A) 12/12/2010 0207   LABSPEC 1.020 12/12/2010 0207   PHURINE 6.0 12/12/2010 0207   GLUCOSEU NEGATIVE 05/30/2008 2053   HGBUR TRACE (A) 12/12/2010 0207   BILIRUBINUR SMALL (A) 12/12/2010 0207   KETONESUR NEGATIVE 12/12/2010 0207   PROTEINUR 30 (A) 12/12/2010 0207   UROBILINOGEN 1.0 12/12/2010 0207   NITRITE NEGATIVE 12/12/2010 0207   LEUKOCYTESUR NEGATIVE 12/12/2010 0207   Sepsis Labs: @LABRCNTIP (procalcitonin:4,lacticidven:4) )No results found for this or any previous visit (from the past 240 hour(s)).   Scheduled Meds: . azithromycin  500 mg Oral Daily  . enoxaparin (LOVENOX) injection  40 mg Subcutaneous Q24H  . furosemide  20 mg Intravenous Daily  . ipratropium-albuterol  3 mL Nebulization BID  . predniSONE  60 mg Oral Q breakfast  . sodium chloride flush  3 mL Intravenous Q12H   Continuous Infusions:  Procedures/Studies: Dg Chest 2 View  Result Date: 01/04/2017 CLINICAL DATA:  Shortness of breath for 2-3 days, some chest pain EXAM: CHEST  2 VIEW COMPARISON:  Chest x-ray 10/13/2012 FINDINGS: No active infiltrate or effusion is seen. Mediastinal and hilar contours are unremarkable. Cardiomegaly is stable. No acute bony abnormality is seen. IMPRESSION: Stable cardiomegaly.  No active lung disease. Electronically Signed   By: Ivar Drape M.D.   On: 01/04/2017 11:16    Alisson Rozell, DO  [facility] Pager [phone]  If 7PM-7AM, please contact night-coverage [email or web address] Password [email or web address] 01/06/2017, 4:59 PM   LOS: 1 day

## 2017-01-07 ENCOUNTER — Encounter (HOSPITAL_COMMUNITY): Payer: Self-pay | Admitting: Adult Health

## 2017-01-07 ENCOUNTER — Inpatient Hospital Stay (HOSPITAL_COMMUNITY): Payer: Medicare Other

## 2017-01-07 DIAGNOSIS — I5021 Acute systolic (congestive) heart failure: Secondary | ICD-10-CM

## 2017-01-07 DIAGNOSIS — I429 Cardiomyopathy, unspecified: Secondary | ICD-10-CM

## 2017-01-07 DIAGNOSIS — R7302 Impaired glucose tolerance (oral): Secondary | ICD-10-CM

## 2017-01-07 LAB — BASIC METABOLIC PANEL
Anion gap: 6 (ref 5–15)
BUN: 18 mg/dL (ref 6–20)
CO2: 38 mmol/L — ABNORMAL HIGH (ref 22–32)
CREATININE: 0.8 mg/dL (ref 0.44–1.00)
Calcium: 8.1 mg/dL — ABNORMAL LOW (ref 8.9–10.3)
Chloride: 96 mmol/L — ABNORMAL LOW (ref 101–111)
GFR calc Af Amer: >60 mL/min (ref >60)
GLUCOSE: 92 mg/dL (ref 65–99)
Potassium: 3.9 mmol/L (ref 3.5–5.1)
SODIUM: 140 mmol/L (ref 135–145)

## 2017-01-07 LAB — HEMOGLOBIN A1C
Hgb A1c MFr Bld: 6.1 % — ABNORMAL HIGH (ref 4.8–5.6)
MEAN PLASMA GLUCOSE: 128 mg/dL

## 2017-01-07 LAB — MAGNESIUM: MAGNESIUM: 1.9 mg/dL (ref 1.7–2.4)

## 2017-01-07 MED ORDER — FUROSEMIDE 10 MG/ML IJ SOLN
40.0000 mg | Freq: Once | INTRAMUSCULAR | Status: AC
  Administered 2017-01-07: 40 mg via INTRAVENOUS
  Filled 2017-01-07: qty 4

## 2017-01-07 MED ORDER — POTASSIUM CHLORIDE CRYS ER 20 MEQ PO TBCR
20.0000 meq | EXTENDED_RELEASE_TABLET | Freq: Every day | ORAL | Status: DC
  Administered 2017-01-07 – 2017-01-14 (×7): 20 meq via ORAL
  Filled 2017-01-07 (×7): qty 1

## 2017-01-07 MED ORDER — LISINOPRIL 2.5 MG PO TABS
2.5000 mg | ORAL_TABLET | Freq: Every day | ORAL | Status: DC
  Administered 2017-01-07 – 2017-01-11 (×4): 2.5 mg via ORAL
  Filled 2017-01-07 (×4): qty 1

## 2017-01-07 MED ORDER — IPRATROPIUM-ALBUTEROL 0.5-2.5 (3) MG/3ML IN SOLN
3.0000 mL | Freq: Three times a day (TID) | RESPIRATORY_TRACT | Status: DC
  Administered 2017-01-07 – 2017-01-08 (×2): 3 mL via RESPIRATORY_TRACT
  Filled 2017-01-07 (×2): qty 3

## 2017-01-07 NOTE — Care Management (Addendum)
CM attempted to make appointment for patient to establish primary care as patient does not have PCP. Attempts unsuccessful. Patient will need to be weaned off oxygen prior to discharge as patient will not be able to have oxygen without having a current PCP.  Provider list given to patient.

## 2017-01-07 NOTE — Consult Note (Signed)
CARDIOLOGY CONSULT NOTE   Patient ID: Pamela Soto MRN: [id number] DOB/AGE: 65-Aug-1952 65 y.o.  Admit Date: 01/04/2017 Referring Physician: TRH-Tat MD Primary Physician: No PCP Per Patient Consulting Cardiologist: Dorris Carnes MD Primary Cardiologist: New Reason for Consultation: Newly diagnosed cardiomyopathy   Clinical Summary Pamela Soto is a 66 y.o.female no prior documented history of coronary artery disease, with known history of COPD, GERD, with presentation to ER with complaints of increased shortness of breath. Breathing status has declined despite breathing treatments at home. This was over and above her normal baseline breathing. She was admitted for COPD with acute exacerbation. Echocardiogram was completed to evaluate for LV function setting of worsening breathing.  The patient states that she does not have a primary care physician, nor has she seen any healthcare provider ER physician or otherwise in over 13-14 years. She is not on any medications at home. The patient states that she has noticed declining breathing status weakness and fatigue over the last 3 years, most significantly over the last few weeks. Her family has also noticed.  This revealed moderate LVH with moderately reduced systolic function, EF of [id number] to 40% with diffuse hypokinesis. There was moderate dilatation of the left atrium, with mild mitral valve and pulmonic valve regurgitation. As result of abnormal echocardiogram cardiology has been consulted for recommendations.  On arrival to the emergency room, the patient was hypertensive with a blood pressure 175/92, heart rate 101, O2 sat 94%, she was afebrile. Labs revealed no evidence of infection, anemia, or thrombocytopenia. Creatinine 0.97, CO2 36, glucose 110. BNP 233.0. Chest x-ray revealed stable cardiomegaly with no active lung disease, no evidence of CHF or pneumonia. EKG revealed sinus tachycardia, LVH, with nonspecific ST T wave abnormalities  laterally.  She was treated with albuterol inhaler analyzer treatment, IV Solu-Medrol 80 mg, and admitted for ongoing management.   She continues on O2 via nasal cannula.   No Known Allergies  Medications Scheduled M were obtained andedications: . azithromycin  500 mg Oral Daily  . enoxaparin (LOVENOX) injection  40 mg Subcutaneous Q24H  . furosemide  20 mg Oral Daily  . ipratropium-albuterol  3 mL Nebulization BID  . predniSONE  60 mg Oral Q breakfast  . sodium chloride flush  3 mL Intravenous Q12H     PRN Medications: sodium chloride, acetaminophen **OR** acetaminophen, albuterol, ondansetron **OR** ondansetron (ZOFRAN) IV, senna-docusate, sodium chloride flush   Past Medical History:  Diagnosis Date  . Acid reflux     Past Surgical History:  Procedure Laterality Date  . CESAREAN SECTION    . CHOLECYSTECTOMY  11/06/2012   Procedure: LAPAROSCOPIC CHOLECYSTECTOMY WITH INTRAOPERATIVE CHOLANGIOGRAM;  Surgeon: Pedro Earls, MD;  Location: [facility];  Service: General;  Laterality: N/A;  . UMBILICAL HERNIA REPAIR  11/06/2012   Procedure: HERNIA REPAIR UMBILICAL ADULT;  Surgeon: Pedro Earls, MD;  Location: [facility];  Service: General;;    Family History  Problem Relation Age of Onset  . Heart disease Mother 60  . Heart attack Mother   . Asthma Father   . Heart attack Brother 67     Social History Pamela Soto reports that she has never smoked. She has never used smokeless tobacco. Pamela Soto reports that she does not drink alcohol.  Review of Systems Complete review of systems are found to be negative unless outlined in H&P above.  Physical Examination Blood pressure 123/79, pulse 85, temperature 98 F (36.7 C), temperature source Oral, resp. rate 20, height 5\' 5"  (  1.651 m), weight 254 lb 11.2 oz (115.5 kg), SpO2 95 %.  Intake/Output Summary (Last 24 hours) at 01/07/17 1005 Last data filed at 01/06/17 2004  Gross per 24 hour  Intake              483 ml  Output               600 ml  Net             -117 ml    Telemetry: Normal sinus rhythm with intraventricular conduction delay.  GEN: Obese, wearing oxygen, no acute distress. HEENT: Conjunctiva and lids have xanthomonas, oropharynx clear with moist mucosa. Neck: Supple, no elevated JVP or carotid bruits, no thyromegaly. Lungs: Inspiratory and expiratory wheezing, diminished in the bases. Cardiac: Regular rate and rhythm, soft systolic murmur , no pericardial rub. Abdomen: Soft, nontender, obese, no hepatomegaly, bowel sounds present, no guarding or rebound. Extremities: No pitting edema, distal pulses 2+. Skin: Warm and dry. Musculoskeletal: No kyphosis. Neuropsychiatric: Alert and oriented x3, affect grossly appropriate.  Prior Cardiac Testing/Procedures 1. Echocardiogram 01/06/2017 Left ventricle: The cavity size was mildly dilated. Wall   thickness was increased in a pattern of moderate LVH. Systolic   function was moderately reduced. The estimated ejection fraction   was in the range of 35% to 40%. Diffuse hypokinesis. - Aortic valve: Mildly calcified annulus. Trileaflet; mildly   thickened leaflets. - Mitral valve: There was mild regurgitation. - Left atrium: The atrium was moderately dilated. - Atrial septum: The septum bowed from left to right, consistent   with increased left atrial pressure. - Pulmonic valve: There was mild regurgitation.  Lab Results  Basic Metabolic Panel:  Recent Labs Lab 01/04/17 1049 01/05/17 0438 01/06/17 0603 01/07/17 0607  NA 140 139 141 140  K 4.0 4.4 4.5 3.9  CL 99* 99* 97* 96*  CO2 36* 33* 38* 38*  GLUCOSE 110* 152* 104* 92  BUN 10 11 20 18   CREATININE 0.97 0.85 1.02* 0.80  CALCIUM 8.8* 8.6* 8.3* 8.1*  MG  --   --  2.2 1.9    CBC:  Recent Labs Lab 01/04/17 1049 01/05/17 0438  WBC 8.8 13.1*  HGB 14.5 14.1  HCT 45.3 44.9  MCV 94.8 94.5  PLT 241 243    Cardiac Enzymes:  Recent Labs Lab 01/04/17 1049  TROPONINI <0.03     BNP: Invalid input(s): POCBNP   Radiology: Dg Chest 2 View  Result Date: 01/07/2017 CLINICAL DATA:  CHF. EXAM: CHEST  2 VIEW COMPARISON:  01/04/2017. FINDINGS: Mediastinum hilar structures stable. Stable cardiomegaly. No evidence of overt congestive heart failure. Low lung volumes. IMPRESSION: 1. Stable chest. Stable cardiomegaly. No evidence of overt congestive heart failure. 2. Low lung volumes . Electronically Signed   By: Marcello Moores  Register   On: 01/07/2017 09:31     ECG: Sinus rhythm with LVH and nonspecific ST-T wave abnormalities laterally.   Impression and Recommendations  1. Systolic dysfunction with cardiomyopathy: Patient is not been seen by cardiologist nor has she had any cardiac testing until this time. Echocardiogram revealed  reduced ejection fraction of 35-40% with diffuse hypokinesis. The patient will likely benefit forearm cardiac catheterization for diagnostic prognostic purposes to evaluate ischemic etiology of reduced EF. This has been discussed with the patient and her family but will defer to Dr. Harrington Challenger to make definitive recommendations and timing concerning necessity.  She is actively wheezing and therefore beta blocker would not be appropriate at this time. She has been placed  on Lasix 20 mg daily. She is not on ACE inhibitor currently. Blood pressure is well-controlled. Would recommend low-dose lisinopril 2.5 mg.  We'll check fasting lipids and LFTs for risk stratification. She does have xanthelasmas noted bilateral eyelids. Other cardiovascular risk factors include family history of premature CAD in her mother, along with CAD with her brother.  2. COPD exacerbation: Currently being treated with albuterol inhalers, antibiotics, and steroids.  3. Morbid obesity: Increased exercise and reduce calorie diet will be a benefit when she returns to her optimal health.  4. Absence from ongoing medical treatment for greater than 13 years: Will need to be established with  primary care provider. Perhaps list can be given to her prior to discharge.  Signed: Phill Myron. Lawrence NP [place]  01/07/2017, 10:05 AM Co-Sign MD  Pt seen and examined  I agree with findings as noted above by Arnold Long Pt is a 66 yo with no known cardiac Hx  Presents with increased SOB  Eval signif for echo that shows LVEF 30 to 35% On exam, pt is morbidly obese  Lungs with decreased airflow and wheezes  Mild rales at bases.Cardiac exam ;  RRR  No S3  Ext with Tr to 1+ edema  1  Acute systolic CHF  Etiology or duration not known  Given Age and Fx I would recomm R and L heart cath once pt's pulmonary status improves    Risks/benefits described  Pt understands and agrees to proceed.  WIll plan tentatively for Monday if pulm status improves   I would recomm IV lasxi x 1  Follow output and exam  Cr normal

## 2017-01-07 NOTE — Progress Notes (Addendum)
PROGRESS NOTE  Pamela Soto [id number] DOB: [birth year] DOA: 01/04/2017 PCP: No PCP Per Patient Brief History: 66 year old female with a history of COPD, GERD, morbid obesity presents with a 2 year history of shortness breath that has worsened over 2weeksprior to admission. The patient was noted to be wheezing and hypoxemic in the emergency department with saturation in the upper 80s. She was given bronchodilators and IV steroids with some improvement in the ED, but the patient remains dyspneic on exertion. The patient denied any worsening lower extremity edema, but complained of increasing abdominal girth. She denied any fevers, chills, chest pain, nausea, vomiting, diarrhea.  Assessment/Plan: Acute respiratory failure with hypoxia -Multifactorial including COPD exacerbation,OSA/OHS, CHF/cardiomyopathy, and deconditioning -Continue duo nebs -Continue prednisone--start taper -Flutter valve -D-Dimer--0.30 -stable on 4 L-->2L [place] presently -pt may have to go home on oxygen  COPD exacerbation -As above -Continue azithromycin  Cardiomyopathy/Acute Systolic CHF -The patient is clinically fluid overloaded with JVD and pulm vasc congestion on CXR -IV Lasix-->redose; she still has JVD on exam and some peripheral edema -Echo--EF 35-40 percent, diffuse HK, mild MR -Appreciate cardiology consult-->plans noted for heart cath on 2/5 or 2/6 -daily weights -accurate I/Os -01/07/17 repeat CXR--no infiltrates, no effusions -start lisinopril 2.5 mg daily -start bisoprolol if BP able to tolerate  Morbid obesity -BMI 42.5 -lifestyle modification  Impaired glucose Tolerance -check A1C--6.1    Disposition Plan: pending heart catherization results  Family Communication: Son updated at bedside  Consultants: cardiology  Code Status: FULL  DVT Prophylaxis: [place] Lovenox   Procedures: As Listed in Progress Note  Above  Antibiotics: None    Subjective: Patient states her breathing is continued to improve but she still has some dyspnea on exertion. Denies any fevers, chills, chest pain, nausea, vomiting, diarrhea, abdominal pain. No dysuria or hematuria. She has a nonproductive cough.  Objective: Vitals:   01/06/17 2049 01/06/17 2101 01/07/17 0626 01/07/17 0748  BP: 136/63  123/79   Pulse: 88  85   Resp: 18  20   Temp: 98.3 F (36.8 C)  98 F (36.7 C)   TempSrc: Oral  Oral   SpO2: 95% 92% 96% 95%  Weight:      Height:        Intake/Output Summary (Last 24 hours) at 01/07/17 1328 Last data filed at 01/06/17 2004  Gross per 24 hour  Intake              243 ml  Output              600 ml  Net             -357 ml   Weight change:  Exam:   General:  Pt is alert, follows commands appropriately, not in acute distress  HEENT: No icterus, No thrush, No neck mass, [place]/AT  Cardiovascular: RRR, S1/S2, no rubs, no gallops  Respiratory: Bibasilar crackles. Bibasilar with her wheeze. Good air movement.  Abdomen: Soft/+BS, non tender, non distended, no guarding  Extremities: 1 + LE edema, No lymphangitis, No petechiae, No rashes, no synovitis   Data Reviewed: I have personally reviewed following labs and imaging studies Basic Metabolic Panel:  Recent Labs Lab 01/04/17 1049 01/05/17 0438 01/06/17 0603 01/07/17 0607  NA 140 139 141 140  K 4.0 4.4 4.5 3.9  CL 99* 99* 97* 96*  CO2 36* 33* 38* 38*  GLUCOSE 110* 152* 104* 92  BUN 10 11 20 18   CREATININE  0.97 0.85 1.02* 0.80  CALCIUM 8.8* 8.6* 8.3* 8.1*  MG  --   --  2.2 1.9   Liver Function Tests: No results for input(s): AST, ALT, ALKPHOS, BILITOT, PROT, ALBUMIN in the last 168 hours. No results for input(s): LIPASE, AMYLASE in the last 168 hours. No results for input(s): AMMONIA in the last 168 hours. Coagulation Profile: No results for input(s): INR, PROTIME in the last 168 hours. CBC:  Recent Labs Lab 01/04/17 1049  01/05/17 0438  WBC 8.8 13.1*  HGB 14.5 14.1  HCT 45.3 44.9  MCV 94.8 94.5  PLT 241 243   Cardiac Enzymes:  Recent Labs Lab 01/04/17 1049  TROPONINI <0.03   BNP: Invalid input(s): POCBNP CBG: No results for input(s): GLUCAP in the last 168 hours. HbA1C:  Recent Labs  01/05/17 0435  HGBA1C 6.1*   Urine analysis:    Component Value Date/Time   COLORURINE AMBER BIOCHEMICALS MAY BE AFFECTED BY COLOR (A) 12/12/2010 0207   APPEARANCEUR CLOUDY (A) 12/12/2010 0207   LABSPEC 1.020 12/12/2010 0207   PHURINE 6.0 12/12/2010 0207   GLUCOSEU NEGATIVE 05/30/2008 2053   HGBUR TRACE (A) 12/12/2010 0207   BILIRUBINUR SMALL (A) 12/12/2010 0207   KETONESUR NEGATIVE 12/12/2010 0207   PROTEINUR 30 (A) 12/12/2010 0207   UROBILINOGEN 1.0 12/12/2010 0207   NITRITE NEGATIVE 12/12/2010 0207   LEUKOCYTESUR NEGATIVE 12/12/2010 0207   Sepsis Labs: @LABRCNTIP (procalcitonin:4,lacticidven:4) )No results found for this or any previous visit (from the past 240 hour(s)).   Scheduled Meds: . azithromycin  500 mg Oral Daily  . enoxaparin (LOVENOX) injection  40 mg Subcutaneous Q24H  . furosemide  20 mg Oral Daily  . ipratropium-albuterol  3 mL Nebulization BID  . lisinopril  2.5 mg Oral Daily  . predniSONE  60 mg Oral Q breakfast  . sodium chloride flush  3 mL Intravenous Q12H   Continuous Infusions:  Procedures/Studies: Dg Chest 2 View  Result Date: 01/07/2017 CLINICAL DATA:  CHF. EXAM: CHEST  2 VIEW COMPARISON:  01/04/2017. FINDINGS: Mediastinum hilar structures stable. Stable cardiomegaly. No evidence of overt congestive heart failure. Low lung volumes. IMPRESSION: 1. Stable chest. Stable cardiomegaly. No evidence of overt congestive heart failure. 2. Low lung volumes . Electronically Signed   By: Marcello Moores  Register   On: 01/07/2017 09:31   Dg Chest 2 View  Result Date: 01/04/2017 CLINICAL DATA:  Shortness of breath for 2-3 days, some chest pain EXAM: CHEST  2 VIEW COMPARISON:  Chest x-ray  10/13/2012 FINDINGS: No active infiltrate or effusion is seen. Mediastinal and hilar contours are unremarkable. Cardiomegaly is stable. No acute bony abnormality is seen. IMPRESSION: Stable cardiomegaly.  No active lung disease. Electronically Signed   By: Ivar Drape M.D.   On: 01/04/2017 11:16    Pamela Rowzee, DO  [facility] Pager [phone]  If 7PM-7AM, please contact night-coverage [email or web address] Password [email or web address] 01/07/2017, 1:28 PM   LOS: 2 days

## 2017-01-08 LAB — BASIC METABOLIC PANEL
Anion gap: 8 (ref 5–15)
BUN: 20 mg/dL (ref 6–20)
CHLORIDE: 92 mmol/L — AB (ref 101–111)
CO2: 40 mmol/L — AB (ref 22–32)
CREATININE: 0.98 mg/dL (ref 0.44–1.00)
Calcium: 8.3 mg/dL — ABNORMAL LOW (ref 8.9–10.3)
GFR calc non Af Amer: 59 mL/min — ABNORMAL LOW (ref >60)
Glucose, Bld: 88 mg/dL (ref 65–99)
Potassium: 3.2 mmol/L — ABNORMAL LOW (ref 3.5–5.1)
Sodium: 140 mmol/L (ref 135–145)

## 2017-01-08 LAB — LIPID PANEL
CHOL/HDL RATIO: 3.7 ratio
CHOLESTEROL: 210 mg/dL — AB (ref 0–200)
HDL: 57 mg/dL (ref >40)
LDL Cholesterol: 129 mg/dL — ABNORMAL HIGH (ref 0–99)
Triglycerides: 118 mg/dL (ref <150)
VLDL: 24 mg/dL (ref 0–40)

## 2017-01-08 LAB — MAGNESIUM: Magnesium: 2 mg/dL (ref 1.7–2.4)

## 2017-01-08 MED ORDER — IPRATROPIUM-ALBUTEROL 0.5-2.5 (3) MG/3ML IN SOLN
3.0000 mL | Freq: Three times a day (TID) | RESPIRATORY_TRACT | Status: DC
  Administered 2017-01-08 – 2017-01-12 (×14): 3 mL via RESPIRATORY_TRACT
  Filled 2017-01-08 (×14): qty 3

## 2017-01-08 MED ORDER — BUDESONIDE 0.5 MG/2ML IN SUSP
0.5000 mg | Freq: Two times a day (BID) | RESPIRATORY_TRACT | Status: DC
  Administered 2017-01-08 – 2017-01-14 (×12): 0.5 mg via RESPIRATORY_TRACT
  Filled 2017-01-08 (×12): qty 2

## 2017-01-08 MED ORDER — FUROSEMIDE 20 MG PO TABS
20.0000 mg | ORAL_TABLET | Freq: Once | ORAL | Status: AC
  Administered 2017-01-08: 20 mg via ORAL
  Filled 2017-01-08: qty 1

## 2017-01-08 MED ORDER — POTASSIUM CHLORIDE CRYS ER 20 MEQ PO TBCR
30.0000 meq | EXTENDED_RELEASE_TABLET | Freq: Once | ORAL | Status: AC
  Administered 2017-01-08: 15:00:00 30 meq via ORAL
  Filled 2017-01-08: qty 1

## 2017-01-08 MED ORDER — FUROSEMIDE 40 MG PO TABS
40.0000 mg | ORAL_TABLET | Freq: Every day | ORAL | Status: DC
  Administered 2017-01-09 – 2017-01-14 (×5): 40 mg via ORAL
  Filled 2017-01-08 (×5): qty 1

## 2017-01-08 MED ORDER — PREDNISONE 50 MG PO TABS
50.0000 mg | ORAL_TABLET | Freq: Every day | ORAL | Status: DC
  Administered 2017-01-09 – 2017-01-12 (×3): 50 mg via ORAL
  Filled 2017-01-08: qty 1
  Filled 2017-01-08: qty 2
  Filled 2017-01-08: qty 1

## 2017-01-08 MED ORDER — BUDESONIDE 0.5 MG/2ML IN SUSP: 0.5000 mg | Freq: Two times a day (BID) | RESPIRATORY_TRACT | Status: DC

## 2017-01-08 NOTE — Progress Notes (Signed)
PROGRESS NOTE  Pamela Soto [id number] DOB: 1951/09/05 DOA: 01/04/2017 PCP: No PCP Per Patient  Brief History: 66 year old female with a history of COPD, GERD, morbid obesity presents with a 2 year history of shortness breath that has worsened over 2weeksprior to admission. The patient was noted to be wheezing and hypoxemic in the emergency department with saturation in the upper 80s. She was given bronchodilators and IV steroids with some improvement in the ED, but the patient remains dyspneic on exertion. The patient denied any worsening lower extremity edema, but complained of increasing abdominal girth. She denied any fevers, chills, chest pain, nausea, vomiting, diarrhea.  Assessment/Plan: Acute respiratory failure with hypoxia -Multifactorial including COPD exacerbation,OSA/OHS, CHF/cardiomyopathy, and deconditioning -Continue duo nebs -Continue prednisone--start taper -Flutter valve -D-Dimer--0.30 -stable on 4 L-->2LNC presently--95-96% -pt may have to go home on oxygen  COPD exacerbation -As above -Finished 5 days azithromycin -wean prednisone -add pulmicort  Cardiomyopathy/Acute Systolic CHF -The patient initially clinically fluid overloaded with JVD and pulm vasc congestion on CXR -volume status better today after IV lasix -continue maintenance furosemide 40 mg daily -Echo--EF 35-40 percent, diffuse HK, mild MR -Appreciate cardiology consult-->plans noted for heart cath on 2/5 or 2/6 -daily weights -accurate I/Os--not accurate -01/07/17 repeat CXR--no infiltrates, no effusions -start lisinopril 2.5 mg daily -start bisoprolol if BP able to tolerate  Morbid obesity -BMI 42.5 -lifestyle modification  Impaired glucose Tolerance -check A1C--6.1    Disposition Plan: pending heart catherization results  Family Communication: Son updated at bedside  Consultants: cardiology  Code Status: FULL  DVT Prophylaxis: [place]  Lovenox   Procedures: As Listed in Progress Note Above  Antibiotics: Azithromycin 1/30>>2/3   Subjective: Patient denies fevers, chills, headache, chest pain, dyspnea, nausea, vomiting, diarrhea, abdominal pain, dysuria, hematuria, hematochezia, and melena. She states that she is able to lay flat. She states that she is able to walk without significant dyspnea at this point.   Objective: Vitals:   01/08/17 0000 01/08/17 0501 01/08/17 0733 01/08/17 1356  BP:  118/74    Pulse: 83 88    Resp: 16 16    Temp:  98.1 F (36.7 C)    TempSrc:  Oral    SpO2: 98% 94% 91% 93%  Weight:      Height:        Intake/Output Summary (Last 24 hours) at 01/08/17 1439 Last data filed at 01/08/17 1100  Gross per 24 hour  Intake              723 ml  Output             1100 ml  Net             -377 ml   Weight change:  Exam:   General:  Pt is alert, follows commands appropriately, not in acute distress  HEENT: No icterus, No thrush, No neck mass, [place]/AT  Cardiovascular: RRR, S1/S2, no rubs, no gallops  Respiratory: Fine bibasilar crackles. No wheeze. Good air movement.  Abdomen: Soft/+BS, non tender, non distended, no guarding  Extremities: No edema, No lymphangitis, No petechiae, No rashes, no synovitis   Data Reviewed: I have personally reviewed following labs and imaging studies Basic Metabolic Panel:  Recent Labs Lab 01/04/17 1049 01/05/17 0438 01/06/17 0603 01/07/17 0607 01/08/17 0602  NA 140 139 141 140 140  K 4.0 4.4 4.5 3.9 3.2*  CL 99* 99* 97* 96* 92*  CO2 36* 33* 38* 38* 40*  GLUCOSE 110* 152* 104* 92 88  BUN 10 11 20 18 20   CREATININE 0.97 0.85 1.02* 0.80 0.98  CALCIUM 8.8* 8.6* 8.3* 8.1* 8.3*  MG  --   --  2.2 1.9 2.0   Liver Function Tests: No results for input(s): AST, ALT, ALKPHOS, BILITOT, PROT, ALBUMIN in the last 168 hours. No results for input(s): LIPASE, AMYLASE in the last 168 hours. No results for input(s): AMMONIA in the last 168  hours. Coagulation Profile: No results for input(s): INR, PROTIME in the last 168 hours. CBC:  Recent Labs Lab 01/04/17 1049 01/05/17 0438  WBC 8.8 13.1*  HGB 14.5 14.1  HCT 45.3 44.9  MCV 94.8 94.5  PLT 241 243   Cardiac Enzymes:  Recent Labs Lab 01/04/17 1049  TROPONINI <0.03   BNP: Invalid input(s): POCBNP CBG: No results for input(s): GLUCAP in the last 168 hours. HbA1C: No results for input(s): HGBA1C in the last 72 hours. Urine analysis:    Component Value Date/Time   COLORURINE AMBER BIOCHEMICALS MAY BE AFFECTED BY COLOR (A) 12/12/2010 0207   APPEARANCEUR CLOUDY (A) 12/12/2010 0207   LABSPEC 1.020 12/12/2010 0207   PHURINE 6.0 12/12/2010 0207   GLUCOSEU NEGATIVE 05/30/2008 2053   HGBUR TRACE (A) 12/12/2010 0207   BILIRUBINUR SMALL (A) 12/12/2010 0207   KETONESUR NEGATIVE 12/12/2010 0207   PROTEINUR 30 (A) 12/12/2010 0207   UROBILINOGEN 1.0 12/12/2010 0207   NITRITE NEGATIVE 12/12/2010 0207   LEUKOCYTESUR NEGATIVE 12/12/2010 0207   Sepsis Labs: @LABRCNTIP (procalcitonin:4,lacticidven:4) )No results found for this or any previous visit (from the past 240 hour(s)).   Scheduled Meds: . azithromycin  500 mg Oral Daily  . enoxaparin (LOVENOX) injection  40 mg Subcutaneous Q24H  . [START ON 01/09/2017] furosemide  40 mg Oral Daily  . ipratropium-albuterol  3 mL Nebulization TID  . lisinopril  2.5 mg Oral Daily  . potassium chloride  20 mEq Oral Daily  . [START ON 01/09/2017] predniSONE  50 mg Oral Q breakfast  . sodium chloride flush  3 mL Intravenous Q12H   Continuous Infusions:  Procedures/Studies: Dg Chest 2 View  Result Date: 01/07/2017 CLINICAL DATA:  CHF. EXAM: CHEST  2 VIEW COMPARISON:  01/04/2017. FINDINGS: Mediastinum hilar structures stable. Stable cardiomegaly. No evidence of overt congestive heart failure. Low lung volumes. IMPRESSION: 1. Stable chest. Stable cardiomegaly. No evidence of overt congestive heart failure. 2. Low lung volumes .  Electronically Signed   By: Marcello Moores  Register   On: 01/07/2017 09:31   Dg Chest 2 View  Result Date: 01/04/2017 CLINICAL DATA:  Shortness of breath for 2-3 days, some chest pain EXAM: CHEST  2 VIEW COMPARISON:  Chest x-ray 10/13/2012 FINDINGS: No active infiltrate or effusion is seen. Mediastinal and hilar contours are unremarkable. Cardiomegaly is stable. No acute bony abnormality is seen. IMPRESSION: Stable cardiomegaly.  No active lung disease. Electronically Signed   By: Ivar Drape M.D.   On: 01/04/2017 11:16    Jaevion Goto, DO  [facility] Pager [phone]  If 7PM-7AM, please contact night-coverage [email or web address] Password [email or web address] 01/08/2017, 2:39 PM   LOS: 3 days

## 2017-01-09 LAB — COMPREHENSIVE METABOLIC PANEL
ALK PHOS: 39 U/L (ref 38–126)
ALT: 25 U/L (ref 14–54)
AST: 17 U/L (ref 15–41)
Albumin: 3.5 g/dL (ref 3.5–5.0)
Anion gap: 7 (ref 5–15)
BILIRUBIN TOTAL: 1.2 mg/dL (ref 0.3–1.2)
BUN: 24 mg/dL — AB (ref 6–20)
CALCIUM: 8.4 mg/dL — AB (ref 8.9–10.3)
CHLORIDE: 95 mmol/L — AB (ref 101–111)
CO2: 39 mmol/L — ABNORMAL HIGH (ref 22–32)
CREATININE: 0.9 mg/dL (ref 0.44–1.00)
Glucose, Bld: 108 mg/dL — ABNORMAL HIGH (ref 65–99)
Potassium: 4 mmol/L (ref 3.5–5.1)
Sodium: 141 mmol/L (ref 135–145)
TOTAL PROTEIN: 6.7 g/dL (ref 6.5–8.1)

## 2017-01-09 MED ORDER — BISOPROLOL FUMARATE 5 MG PO TABS
2.5000 mg | ORAL_TABLET | Freq: Every day | ORAL | Status: DC
  Administered 2017-01-09 – 2017-01-11 (×2): 2.5 mg via ORAL
  Filled 2017-01-09 (×2): qty 0.5
  Filled 2017-01-09: qty 1

## 2017-01-09 NOTE — Progress Notes (Addendum)
PROGRESS NOTE  Pamela Soto [id number] DOB: October 05, 1951 DOA: 01/04/2017 PCP: No PCP Per Patient   Brief History: 66 year old female with a history of COPD, GERD, morbid obesity presents with a 2 year history of shortness breath that has worsened over 2weeksprior to admission. The patient was noted to be wheezing and hypoxemic in the emergency department with saturation in the upper 80s. She was given bronchodilators and IV steroids with some improvement in the ED, but the patient remains dyspneic on exertion. The patient denied any worsening lower extremity edema, but complained of increasing abdominal girth. She denied any fevers, chills, chest pain, nausea, vomiting, diarrhea.  Assessment/Plan: Acute respiratory failure with hypoxia -Multifactorial including COPD exacerbation,OSA/OHS, CHF/cardiomyopathy, and deconditioning -Continue duo nebs -Continue prednisone--start taper -Flutter valve -D-Dimer--0.30 -stable on 4 L-->2LNC presently--94-96% -pt may have to go home on oxygen  COPD exacerbation -As above-->improved -Finished 5 days azithromycin -wean prednisone -added pulmicort  Cardiomyopathy/Acute Systolic CHF -The patient initially clinically fluid overloaded with JVD and pulm vasc congestion on CXR -volume status better after IV lasix -continue maintenance furosemide 40 mg daily -Echo--EF 35-40 percent, diffuse HK, mild MR -Appreciate cardiology consult-->plans noted for heart cath on 2/5 or 2/6 -daily weights--not accurate -accurate I/Os--not accurate -01/07/17 repeat CXR--no infiltrates, no effusions -start lisinopril 2.5 mg daily -start bisoprolol--personally checked BP 140/62, HR 100  Morbid obesity -BMI 42.5 -lifestyle modification  Impaired glucose Tolerance -check A1C--6.1    Disposition Plan: pending heart catherization results Family Communication: Son updatedat bedside 01/08/17  Consultants: cardiology  Code Status:  FULL  DVT Prophylaxis: [place] Lovenox   Procedures: As Listed in Progress Note Above  Antibiotics: Azithromycin 1/30>>2/3    Subjective: Patient denies fevers, chills, headache, chest pain, dyspnea, nausea, vomiting, diarrhea, abdominal pain, dysuria, hematuria, hematochezia, and melena. She has some mild dyspnea on exertion which is continuing to improve.   Objective: Vitals:   01/09/17 0540 01/09/17 0751 01/09/17 1300 01/09/17 1458  BP: (!) 104/50  98/66   Pulse: 91  96   Resp: 16  20   Temp: 98.3 F (36.8 C)  98.4 F (36.9 C)   TempSrc: Oral  Oral   SpO2: 97% 90% 91% (!) 85%  Weight:      Height:        Intake/Output Summary (Last 24 hours) at 01/09/17 1633 Last data filed at 01/09/17 1200  Gross per 24 hour  Intake              723 ml  Output             1200 ml  Net             -477 ml   Weight change:  Exam:   General:  Pt is alert, follows commands appropriately, not in acute distress  HEENT: No icterus, No thrush, No neck mass, [place]/AT  Cardiovascular: RRR, S1/S2, no rubs, no gallops  Respiratory: Bibasilar rales without wheezing. Good air movement.  Abdomen: Soft/+BS, non tender, non distended, no guarding  Extremities: No edema, No lymphangitis, No petechiae, No rashes, no synovitis   Data Reviewed: I have personally reviewed following labs and imaging studies Basic Metabolic Panel:  Recent Labs Lab 01/05/17 0438 01/06/17 0603 01/07/17 0607 01/08/17 0602 01/09/17 0604  NA 139 141 140 140 141  K 4.4 4.5 3.9 3.2* 4.0  CL 99* 97* 96* 92* 95*  CO2 33* 38* 38* 40* 39*  GLUCOSE 152* 104* 92 88 108*  BUN 11 20 18 20  24*  CREATININE 0.85 1.02* 0.80 0.98 0.90  CALCIUM 8.6* 8.3* 8.1* 8.3* 8.4*  MG  --  2.2 1.9 2.0  --    Liver Function Tests:  Recent Labs Lab 01/09/17 0604  AST 17  ALT 25  ALKPHOS 39  BILITOT 1.2  PROT 6.7  ALBUMIN 3.5   No results for input(s): LIPASE, AMYLASE in the last 168 hours. No results for input(s):  AMMONIA in the last 168 hours. Coagulation Profile: No results for input(s): INR, PROTIME in the last 168 hours. CBC:  Recent Labs Lab 01/04/17 1049 01/05/17 0438  WBC 8.8 13.1*  HGB 14.5 14.1  HCT 45.3 44.9  MCV 94.8 94.5  PLT 241 243   Cardiac Enzymes:  Recent Labs Lab 01/04/17 1049  TROPONINI <0.03   BNP: Invalid input(s): POCBNP CBG: No results for input(s): GLUCAP in the last 168 hours. HbA1C: No results for input(s): HGBA1C in the last 72 hours. Urine analysis:    Component Value Date/Time   COLORURINE AMBER BIOCHEMICALS MAY BE AFFECTED BY COLOR (A) 12/12/2010 0207   APPEARANCEUR CLOUDY (A) 12/12/2010 0207   LABSPEC 1.020 12/12/2010 0207   PHURINE 6.0 12/12/2010 0207   GLUCOSEU NEGATIVE 05/30/2008 2053   HGBUR TRACE (A) 12/12/2010 0207   BILIRUBINUR SMALL (A) 12/12/2010 0207   KETONESUR NEGATIVE 12/12/2010 0207   PROTEINUR 30 (A) 12/12/2010 0207   UROBILINOGEN 1.0 12/12/2010 0207   NITRITE NEGATIVE 12/12/2010 0207   LEUKOCYTESUR NEGATIVE 12/12/2010 0207   Sepsis Labs: @LABRCNTIP (procalcitonin:4,lacticidven:4) )No results found for this or any previous visit (from the past 240 hour(s)).   Scheduled Meds: . budesonide (PULMICORT) nebulizer solution  0.5 mg Nebulization BID  . enoxaparin (LOVENOX) injection  40 mg Subcutaneous Q24H  . furosemide  40 mg Oral Daily  . ipratropium-albuterol  3 mL Nebulization TID  . lisinopril  2.5 mg Oral Daily  . potassium chloride  20 mEq Oral Daily  . predniSONE  50 mg Oral Q breakfast  . sodium chloride flush  3 mL Intravenous Q12H   Continuous Infusions:  Procedures/Studies: Dg Chest 2 View  Result Date: 01/07/2017 CLINICAL DATA:  CHF. EXAM: CHEST  2 VIEW COMPARISON:  01/04/2017. FINDINGS: Mediastinum hilar structures stable. Stable cardiomegaly. No evidence of overt congestive heart failure. Low lung volumes. IMPRESSION: 1. Stable chest. Stable cardiomegaly. No evidence of overt congestive heart failure. 2. Low  lung volumes . Electronically Signed   By: Marcello Moores  Register   On: 01/07/2017 09:31   Dg Chest 2 View  Result Date: 01/04/2017 CLINICAL DATA:  Shortness of breath for 2-3 days, some chest pain EXAM: CHEST  2 VIEW COMPARISON:  Chest x-ray 10/13/2012 FINDINGS: No active infiltrate or effusion is seen. Mediastinal and hilar contours are unremarkable. Cardiomegaly is stable. No acute bony abnormality is seen. IMPRESSION: Stable cardiomegaly.  No active lung disease. Electronically Signed   By: Ivar Drape M.D.   On: 01/04/2017 11:16    Burle Kwan, DO  [facility] Pager [phone]  If 7PM-7AM, please contact night-coverage [email or web address] Password [email or web address] 01/09/2017, 4:33 PM   LOS: 4 days

## 2017-01-10 ENCOUNTER — Encounter (HOSPITAL_COMMUNITY)
Admission: EM | Disposition: A | Discharge status: Home / Self Care | Payer: Self-pay | Source: Home / Self Care | Attending: Internal Medicine

## 2017-01-10 DIAGNOSIS — I480 Paroxysmal atrial fibrillation: Secondary | ICD-10-CM

## 2017-01-10 DIAGNOSIS — J441 Chronic obstructive pulmonary disease with (acute) exacerbation: Secondary | ICD-10-CM

## 2017-01-10 DIAGNOSIS — I2510 Atherosclerotic heart disease of native coronary artery without angina pectoris: Secondary | ICD-10-CM

## 2017-01-10 HISTORY — PX: RIGHT/LEFT HEART CATH AND CORONARY ANGIOGRAPHY: CATH118266

## 2017-01-10 LAB — POCT I-STAT 3, ART BLOOD GAS (G3+)
ACID-BASE EXCESS: 9 mmol/L — AB (ref 0.0–2.0)
Bicarbonate: 37.8 mmol/L — ABNORMAL HIGH (ref 20.0–28.0)
O2 SAT: 94 %
PO2 ART: 79 mmHg — AB (ref 83.0–108.0)
TCO2: 40 mmol/L (ref 0–100)
pCO2 arterial: 68.5 mmHg (ref 32.0–48.0)
pH, Arterial: 7.349 — ABNORMAL LOW (ref 7.350–7.450)

## 2017-01-10 LAB — POCT I-STAT 3, VENOUS BLOOD GAS (G3P V)
Acid-Base Excess: 10 mmol/L — ABNORMAL HIGH (ref 0.0–2.0)
Bicarbonate: 39.3 mmol/L — ABNORMAL HIGH (ref 20.0–28.0)
O2 Saturation: 56 %
TCO2: 42 mmol/L (ref 0–100)
pCO2, Ven: 75 mmHg (ref 44.0–60.0)
pH, Ven: 7.327 (ref 7.250–7.430)
pO2, Ven: 33 mmHg (ref 32.0–45.0)

## 2017-01-10 LAB — BASIC METABOLIC PANEL
ANION GAP: 9 (ref 5–15)
BUN: 25 mg/dL — ABNORMAL HIGH (ref 6–20)
CALCIUM: 8.4 mg/dL — AB (ref 8.9–10.3)
CO2: 32 mmol/L (ref 22–32)
CREATININE: 0.93 mg/dL (ref 0.44–1.00)
Chloride: 96 mmol/L — ABNORMAL LOW (ref 101–111)
GFR calc non Af Amer: >60 mL/min (ref >60)
Glucose, Bld: 90 mg/dL (ref 65–99)
Potassium: 4.3 mmol/L (ref 3.5–5.1)
SODIUM: 137 mmol/L (ref 135–145)

## 2017-01-10 LAB — TROPONIN I
TROPONIN I: 0.04 ng/mL — AB (ref <0.03)
TROPONIN I: 0.47 ng/mL — AB (ref <0.03)

## 2017-01-10 LAB — PROTIME-INR
INR: 0.96
PROTHROMBIN TIME: 12.8 s (ref 11.4–15.2)

## 2017-01-10 LAB — CBC
HEMATOCRIT: 46.6 % — AB (ref 36.0–46.0)
Hemoglobin: 14.9 g/dL (ref 12.0–15.0)
MCH: 30.8 pg (ref 26.0–34.0)
MCHC: 32 g/dL (ref 30.0–36.0)
MCV: 96.3 fL (ref 78.0–100.0)
PLATELETS: 254 10*3/uL (ref 150–400)
RBC: 4.84 MIL/uL (ref 3.87–5.11)
RDW: 13.7 % (ref 11.5–15.5)
WBC: 19.3 10*3/uL — AB (ref 4.0–10.5)

## 2017-01-10 LAB — POCT ACTIVATED CLOTTING TIME: Activated Clotting Time: 169 seconds

## 2017-01-10 LAB — MRSA PCR SCREENING: MRSA by PCR: NEGATIVE

## 2017-01-10 LAB — HEPARIN LEVEL (UNFRACTIONATED)

## 2017-01-10 SURGERY — RIGHT/LEFT HEART CATH AND CORONARY ANGIOGRAPHY
Anesthesia: LOCAL

## 2017-01-10 MED ORDER — SODIUM CHLORIDE 0.9 % IV SOLN
INTRAVENOUS | Status: DC | PRN
  Administered 2017-01-10: 10 mL/h via INTRAVENOUS

## 2017-01-10 MED ORDER — HEPARIN SODIUM (PORCINE) 1000 UNIT/ML IJ SOLN
INTRAMUSCULAR | Status: DC | PRN
  Administered 2017-01-10: 5000 [IU] via INTRAVENOUS

## 2017-01-10 MED ORDER — HEPARIN (PORCINE) IN NACL 2-0.9 UNIT/ML-% IJ SOLN
INTRAMUSCULAR | Status: AC
  Filled 2017-01-10: qty 1000

## 2017-01-10 MED ORDER — ACETAMINOPHEN 325 MG PO TABS: 650.0000 mg | ORAL_TABLET | ORAL | Status: DC | PRN

## 2017-01-10 MED ORDER — HEPARIN (PORCINE) IN NACL 100-0.45 UNIT/ML-% IJ SOLN
1500.0000 [IU]/h | INTRAMUSCULAR | Status: DC
  Administered 2017-01-10: 850 [IU]/h via INTRAVENOUS
  Administered 2017-01-11: 1500 [IU]/h via INTRAVENOUS
  Filled 2017-01-10 (×3): qty 250

## 2017-01-10 MED ORDER — SODIUM CHLORIDE 0.9% FLUSH: 3.0000 mL | Freq: Two times a day (BID) | INTRAVENOUS | Status: DC

## 2017-01-10 MED ORDER — AMIODARONE HCL IN DEXTROSE 360-4.14 MG/200ML-% IV SOLN
30.0000 mg/h | INTRAVENOUS | Status: DC
  Administered 2017-01-10 – 2017-01-11 (×2): 30 mg/h via INTRAVENOUS
  Filled 2017-01-10 (×4): qty 200

## 2017-01-10 MED ORDER — ONDANSETRON HCL 4 MG/2ML IJ SOLN: 4.0000 mg | Freq: Four times a day (QID) | INTRAMUSCULAR | Status: DC | PRN

## 2017-01-10 MED ORDER — ATORVASTATIN CALCIUM 80 MG PO TABS
80.0000 mg | ORAL_TABLET | Freq: Every day | ORAL | Status: DC
  Administered 2017-01-10 – 2017-01-13 (×4): 80 mg via ORAL
  Filled 2017-01-10 (×4): qty 1

## 2017-01-10 MED ORDER — IOPAMIDOL (ISOVUE-370) INJECTION 76%
INTRAVENOUS | Status: DC | PRN
  Administered 2017-01-10: 90 mL via INTRA_ARTERIAL

## 2017-01-10 MED ORDER — TIROFIBAN HCL IN NACL 5-0.9 MG/100ML-% IV SOLN
INTRAVENOUS | Status: AC
  Filled 2017-01-10: qty 100

## 2017-01-10 MED ORDER — VERAPAMIL HCL 2.5 MG/ML IV SOLN
INTRAVENOUS | Status: AC
  Filled 2017-01-10: qty 2

## 2017-01-10 MED ORDER — IOPAMIDOL (ISOVUE-370) INJECTION 76%
INTRAVENOUS | Status: AC
  Filled 2017-01-10: qty 100

## 2017-01-10 MED ORDER — SODIUM CHLORIDE 0.9% FLUSH: 3.0000 mL | INTRAVENOUS | Status: DC | PRN

## 2017-01-10 MED ORDER — SODIUM CHLORIDE 0.9 % IV SOLN
250.0000 mL | INTRAVENOUS | Status: DC | PRN
  Administered 2017-01-11: 10 mL via INTRAVENOUS

## 2017-01-10 MED ORDER — VERAPAMIL HCL 2.5 MG/ML IV SOLN
INTRAVENOUS | Status: DC | PRN
  Administered 2017-01-10: 10 mL via INTRA_ARTERIAL

## 2017-01-10 MED ORDER — AMIODARONE LOAD VIA INFUSION
150.0000 mg | Freq: Once | INTRAVENOUS | Status: AC
  Administered 2017-01-10: 150 mg via INTRAVENOUS
  Filled 2017-01-10: qty 83.34

## 2017-01-10 MED ORDER — FUROSEMIDE 10 MG/ML IJ SOLN
40.0000 mg | Freq: Once | INTRAMUSCULAR | Status: AC
  Administered 2017-01-10: 40 mg via INTRAVENOUS

## 2017-01-10 MED ORDER — FUROSEMIDE 10 MG/ML IJ SOLN
INTRAMUSCULAR | Status: AC
  Filled 2017-01-10: qty 4

## 2017-01-10 MED ORDER — SODIUM CHLORIDE 0.9 % IV SOLN: 250.0000 mL | INTRAVENOUS | Status: DC | PRN

## 2017-01-10 MED ORDER — TIROFIBAN (AGGRASTAT) BOLUS VIA INFUSION
INTRAVENOUS | Status: DC | PRN
  Administered 2017-01-10: 2902.5 ug via INTRAVENOUS

## 2017-01-10 MED ORDER — SODIUM CHLORIDE 0.9 % WEIGHT BASED INFUSION: 3.0000 mL/kg/h | INTRAVENOUS | Status: DC

## 2017-01-10 MED ORDER — HEPARIN (PORCINE) IN NACL 2-0.9 UNIT/ML-% IJ SOLN
INTRAMUSCULAR | Status: DC | PRN
  Administered 2017-01-10: 1000 mL

## 2017-01-10 MED ORDER — LIDOCAINE HCL (PF) 1 % IJ SOLN
INTRAMUSCULAR | Status: DC | PRN
  Administered 2017-01-10 (×2): 2 mL

## 2017-01-10 MED ORDER — LIDOCAINE HCL (PF) 1 % IJ SOLN
INTRAMUSCULAR | Status: AC
  Filled 2017-01-10: qty 30

## 2017-01-10 MED ORDER — HEPARIN BOLUS VIA INFUSION
2000.0000 [IU] | Freq: Once | INTRAVENOUS | Status: AC
  Administered 2017-01-10: 2000 [IU] via INTRAVENOUS
  Filled 2017-01-10: qty 2000

## 2017-01-10 MED ORDER — OXYCODONE-ACETAMINOPHEN 5-325 MG PO TABS
1.0000 | ORAL_TABLET | ORAL | Status: DC | PRN
  Administered 2017-01-12: 1 via ORAL
  Filled 2017-01-10: qty 1

## 2017-01-10 MED ORDER — ASPIRIN 81 MG PO CHEW
81.0000 mg | CHEWABLE_TABLET | ORAL | Status: AC
  Administered 2017-01-10: 81 mg via ORAL
  Filled 2017-01-10: qty 1

## 2017-01-10 MED ORDER — SODIUM CHLORIDE 0.9 % WEIGHT BASED INFUSION: 1.0000 mL/kg/h | INTRAVENOUS | Status: DC

## 2017-01-10 MED ORDER — SODIUM CHLORIDE 0.9 % IV SOLN: INTRAVENOUS | Status: AC

## 2017-01-10 MED ORDER — ASPIRIN 81 MG PO CHEW
81.0000 mg | CHEWABLE_TABLET | Freq: Every day | ORAL | Status: DC
  Administered 2017-01-11: 81 mg via ORAL
  Filled 2017-01-10: qty 1

## 2017-01-10 MED ORDER — AMIODARONE HCL IN DEXTROSE 360-4.14 MG/200ML-% IV SOLN
60.0000 mg/h | INTRAVENOUS | Status: DC
  Administered 2017-01-10: 60 mg/h via INTRAVENOUS

## 2017-01-10 MED ORDER — AMIODARONE HCL 150 MG/3ML IV SOLN
INTRAVENOUS | Status: AC
  Filled 2017-01-10: qty 3

## 2017-01-10 MED ORDER — AMIODARONE HCL 150 MG/3ML IV SOLN
INTRAVENOUS | Status: DC | PRN
  Administered 2017-01-10: 150 mg via INTRAVENOUS

## 2017-01-10 SURGICAL SUPPLY — 14 items
CATH BALLN WEDGE 5F 110CM (CATHETERS) ×2 IMPLANT
CATH INFINITI 5 FR JL3.5 (CATHETERS) ×2 IMPLANT
CATH INFINITI JR4 5F (CATHETERS) ×2 IMPLANT
COVER PRB 48X5XTLSCP FOLD TPE (BAG) ×1 IMPLANT
COVER PROBE 5X48 (BAG) ×1
DEVICE RAD COMP TR BAND LRG (VASCULAR PRODUCTS) ×2 IMPLANT
GLIDESHEATH SLEND A-KIT 6F 22G (SHEATH) ×2 IMPLANT
GUIDEWIRE INQWIRE 1.5J.035X260 (WIRE) ×1 IMPLANT
INQWIRE 1.5J .035X260CM (WIRE) ×2
KIT HEART LEFT (KITS) ×2 IMPLANT
PACK CARDIAC CATHETERIZATION (CUSTOM PROCEDURE TRAY) ×2 IMPLANT
SHEATH FAST CATH BRACH 5F 5CM (SHEATH) ×2 IMPLANT
TRANSDUCER W/STOPCOCK (MISCELLANEOUS) ×2 IMPLANT
TUBING CIL FLEX 10 FLL-RA (TUBING) ×2 IMPLANT

## 2017-01-10 NOTE — Progress Notes (Signed)
Site area: rt ac venous sheath Site Prior to Removal:  Level 0 Pressure Applied For:  10 minutes Manual:   yes Patient Status During Pull:  stable Post Pull Site:  Level  0 Post Pull Instructions Given:  yes Post Pull Pulses Present: palpable Dressing Applied:  Gauze and tegaderm Bedrest begins @  Comments:   

## 2017-01-10 NOTE — Progress Notes (Signed)
CRITICAL VALUE ALERT  Critical value received:  Troponin 0.04  Date of notification:  01/10/2017  Time of notification:  1804  Critical value read back:Yes.    Nurse who received alert:  Shayne Alken RN  MD notified (1st page):  Dr.Tat  Time of first page:  1810  MD notified (2nd page):  Time of second page:  Responding MD:  Dr.Tat  Time MD responded:  [phone]

## 2017-01-10 NOTE — Progress Notes (Signed)
Transferred to [facility] for Cardiac Catherization, out in stable condition to cath lab by [facility].

## 2017-01-10 NOTE — Progress Notes (Signed)
Subjective:  Denies SSCP, palpitations or Dyspnea Laying flat in bed   Objective:  Vitals:   01/10/17 0457 01/10/17 0618 01/10/17 0739 01/10/17 0744  BP:  (!) 104/57    Pulse:  76    Resp:  15    Temp:  97.8 F (36.6 C)    TempSrc:  Oral    SpO2:  94% 92% 92%  Weight: 255 lb 15.3 oz (116.1 kg)     Height:        Intake/Output from previous day:  Intake/Output Summary (Last 24 hours) at 01/10/17 0803 Last data filed at 01/10/17 [id number]  Gross per 24 hour  Intake              483 ml  Output             1450 ml  Net             -967 ml    Physical Exam: Affect appropriate Obese white female  HEENT: normal Neck supple with no adenopathy JVP normal no bruits no thyromegaly Lungs mild exp wheezing and good diaphragmatic motion Heart:  S1/S2 no murmur, no rub, gallop or click PMI normal Abdomen: benighn, BS positve, no tenderness, no AAA no bruit.  No HSM or HJR Distal pulses intact with no bruits No edema Neuro non-focal Skin warm and dry No muscular weakness   Lab Results: Basic Metabolic Panel:  Recent Labs  01/08/17 0602 01/09/17 0604 01/10/17 0457  NA 140 141 137  K 3.2* 4.0 4.3  CL 92* 95* 96*  CO2 40* 39* 32  GLUCOSE 88 108* 90  BUN 20 24* 25*  CREATININE 0.98 0.90 0.93  CALCIUM 8.3* 8.4* 8.4*  MG 2.0  --   --    Liver Function Tests:  Recent Labs  01/09/17 0604  AST 17  ALT 25  ALKPHOS 39  BILITOT 1.2  PROT 6.7  ALBUMIN 3.5   CBC:  Recent Labs  01/10/17 0457  WBC 19.3*  HGB 14.9  HCT 46.6*  MCV 96.3  PLT 254   Fasting Lipid Panel:  Recent Labs  01/08/17 0602  CHOL 210*  HDL 57  LDLCALC 129*  TRIG 118  CHOLHDL 3.7    Imaging: No results found.  Cardiac Studies:  ECG:  Orders placed or performed during the hospital encounter of 01/04/17  . EKG 12-Lead  . EKG 12-Lead  . EKG     Telemetry:  NsR 01/10/2017   Echo:  EF 35-40%  Mild MR   Medications:   . bisoprolol  2.5 mg Oral Daily  . budesonide  (PULMICORT) nebulizer solution  0.5 mg Nebulization BID  . enoxaparin (LOVENOX) injection  40 mg Subcutaneous Q24H  . furosemide  40 mg Oral Daily  . ipratropium-albuterol  3 mL Nebulization TID  . lisinopril  2.5 mg Oral Daily  . potassium chloride  20 mEq Oral Daily  . predniSONE  50 mg Oral Q breakfast  . sodium chloride flush  3 mL Intravenous Q12H  . sodium chloride flush  3 mL Intravenous Q12H     . sodium chloride      Assessment/Plan:   CHF:  See note by Dr Harrington Challenger 01/07/17  Transfer to [facility] for right [place] left heart cath Discussed with patient willing to proceed Transfer to Hospitalist service there And if we can get her there early enough will have cath today   COPD:  Improved less wheezing continue atrovent and pulmicort  Jenkins Rouge  01/10/2017, 8:03 AM

## 2017-01-10 NOTE — Progress Notes (Signed)
Paged about elevated troponin 0.04.  Pt already on IV heparin No further interventions.  Tried to call back [phone], no answer.  DTat

## 2017-01-10 NOTE — Progress Notes (Signed)
ANTICOAGULATION CONSULT NOTE  Pharmacy Consult for IV heparin Indication: atrial fibrillation, coronary embolism  No Known Allergies  Patient Measurements: Height: 5\' 5"  (165.1 cm) Weight: 245 lb 13 oz (111.5 kg) IBW/kg (Calculated) : 57 Heparin Dosing Weight: ~ 84 kg  Vital Signs: Temp: 97.7 F (36.5 C) (02/05 1712) Temp Source: Oral (02/05 1712) BP: 159/76 (02/05 2000) Pulse Rate: 76 (02/05 2000)  Labs:  Recent Labs  01/08/17 0602 01/09/17 0604 01/10/17 0457 01/10/17 1642 01/10/17 1928  HGB  --   --  14.9  --   --   HCT  --   --  46.6*  --   --   PLT  --   --  254  --   --   LABPROT  --   --  12.8  --   --   INR  --   --  0.96  --   --   HEPARINUNFRC  --   --   --   --  <0.10*  CREATININE 0.98 0.90 0.93  --   --   TROPONINI  --   --   --  0.04*  --     Estimated Creatinine Clearance: 75 mL/min (by C-G formula based on SCr of 0.93 mg/dL).   Medical History: Past Medical History:  Diagnosis Date  . Acid reflux     Medications:  . amiodarone 30 mg/hr (01/10/17 1700)  . heparin 850 Units/hr (01/10/17 1414)    Assessment: 66 yo female transferred [facility] to [facility] for cardiac cath.  Developed afib in cath lab, also with "100% distal/apical LAD stenosis. This lesion is probably chronic but difficult to determine age. There is also a possibility that it is acute embolic occlusion from a catheter-based thrombus".    Initial HL is undetectable on heparin 850 units/hr. Nurse reports no issues with infusion or bleeding.  Goal of Therapy:  Heparin level 0.3-0.7 units/ml Monitor platelets by anticoagulation protocol: Yes   Plan:  Bolus heparin 2000 units and increase infusion to 1150 units/hr 6h HL Daily heparin level and CBC  Andrey Cota. Diona Foley, PharmD, BCPS Clinical Pharmacist [phone] 01/10/2017 8:40 PM

## 2017-01-10 NOTE — Progress Notes (Signed)
TR BAND REMOVAL  LOCATION:    Radial  Right wrist  DEFLATED PER PROTOCOL:   yes  TIME BAND OFF / DRESSING APPLIED:    1340/ gauze and tegaderm  SITE UPON ARRIVAL:    Level 0  SITE AFTER BAND REMOVAL:    Level 0  CIRCULATION SENSATION AND MOVEMENT:    Within Normal Limits : yes  COMMENTS:

## 2017-01-10 NOTE — Care Management (Signed)
Patient transferred to [facility] for heart cath.

## 2017-01-10 NOTE — Progress Notes (Signed)
PROGRESS NOTE  Pamela Soto [id number] DOB: 1951-10-26 DOA: 01/04/2017 PCP: No PCP Per Patient  Brief History: 66 year old female with a history of COPD, GERD, morbid obesity presents with a 2 year history of shortness breath that has worsened over 2weeksprior to admission. The patient was noted to be wheezing and hypoxemic in the emergency department with saturation in the upper 80s. She was given bronchodilators and IV steroids with some improvement in the ED, but the patient remains dyspneic on exertion. The patient denied any worsening lower extremity edema, but complained of increasing abdominal girth. She denied any fevers, chills, chest pain, nausea, vomiting, diarrhea. The patient was treated for COPD exacerbation, but initial history and exam suggested respiratory failure from a degree of fluid overload as well as multifactorial etiologies. The patient was treated with intravenous furosemide with good clinical effect, and her furosemide has been de-escalated to po. Cardiology was consulted for newly diagnosed cardiomyopathy with EF 35-40 percent. The patient was transferred to [place] on 01/10/2017 for heart catheterization.  Assessment/Plan: Acute respiratory failure with hypoxia -Multifactorial including COPD exacerbation,OSA/OHS, CHF/cardiomyopathy, and deconditioning -Continue duo nebs -Continue prednisone--start taper -Flutter valve -D-Dimer--0.30 -stable on 4 L-->2LNC presently--93-96% -pt may have to go home on oxygen  COPD exacerbation -As above-->improved -Finished 5 daysazithromycin -wean prednisone -continue pulmicort  Cardiomyopathy/Acute Systolic CHF -The patient initiallyclinically fluid overloaded with JVD and pulm vasc congestion on CXR -volume status better after IV lasix but somewhat difficult to assess due to body habitus -continue maintenance furosemide 40 mg daily -able to lay flat -Echo--EF 35-40 percent, diffuse HK, mild  MR -Appreciate cardiology consult-->plans heart cath on 2/5  -daily weights--not accurate -accurate I/Os--not accurate -continue lisinopril 2.5 mg daily -continue bisoprolol--personally checked BP 140/62, HR 100 -01/10/17--discussed with cardiology--transfer to [facility] to stay after cath  Morbid obesity -BMI 42.5 -lifestyle modification  Impaired glucose Tolerance -check A1C--6.1  Leukocytosis -due to steroids -no fever; hemodynamically stable    Disposition Plan: pending heart catherization  Family Communication: No family at bedside--Total time spent 35 minutes.  Greater than 50% spent face to face counseling and coordinating care.  Consultants: cardiology  Code Status: FULL  DVT Prophylaxis: [place] Lovenox   Procedures: As Listed in Progress Note Above  Antibiotics: Azithromycin 1/30>>2/3    Subjective: Patient denies fevers, chills, headache, chest pain, dyspnea, nausea, vomiting, diarrhea, abdominal pain, dysuria, hematuria, hematochezia, and melena.  Overall breathing is better.  Able to walk to bathroom without dyspnea now.   Objective: Vitals:   01/10/17 0457 01/10/17 0618 01/10/17 0739 01/10/17 0744  BP:  (!) 104/57    Pulse:  76    Resp:  15    Temp:  97.8 F (36.6 C)    TempSrc:  Oral    SpO2:  94% 92% 92%  Weight: 116.1 kg (255 lb 15.3 oz)     Height:        Intake/Output Summary (Last 24 hours) at 01/10/17 0754 Last data filed at 01/10/17 [id number]  Gross per 24 hour  Intake              723 ml  Output             1450 ml  Net             -727 ml   Weight change:  Exam:   General:  Pt is alert, follows commands appropriately, not in acute distress  HEENT: No icterus, No thrush,  No neck mass, /AT  Cardiovascular: RRR, S1/S2, no rubs, no gallops  Respiratory: bibasilar rales without wheeze.  Good air movement  Abdomen: Soft/+BS, non tender, non distended, no guarding  Extremities: No edema, No lymphangitis, No  petechiae, No rashes, no synovitis   Data Reviewed: I have personally reviewed following labs and imaging studies Basic Metabolic Panel:  Recent Labs Lab 01/06/17 0603 01/07/17 0607 01/08/17 0602 01/09/17 0604 01/10/17 0457  NA 141 140 140 141 137  K 4.5 3.9 3.2* 4.0 4.3  CL 97* 96* 92* 95* 96*  CO2 38* 38* 40* 39* 32  GLUCOSE 104* 92 88 108* 90  BUN 20 18 20  24* 25*  CREATININE 1.02* 0.80 0.98 0.90 0.93  CALCIUM 8.3* 8.1* 8.3* 8.4* 8.4*  MG 2.2 1.9 2.0  --   --    Liver Function Tests:  Recent Labs Lab 01/09/17 0604  AST 17  ALT 25  ALKPHOS 39  BILITOT 1.2  PROT 6.7  ALBUMIN 3.5   No results for input(s): LIPASE, AMYLASE in the last 168 hours. No results for input(s): AMMONIA in the last 168 hours. Coagulation Profile:  Recent Labs Lab 01/10/17 0457  INR 0.96   CBC:  Recent Labs Lab 01/04/17 1049 01/05/17 0438 01/10/17 0457  WBC 8.8 13.1* 19.3*  HGB 14.5 14.1 14.9  HCT 45.3 44.9 46.6*  MCV 94.8 94.5 96.3  PLT 241 243 254   Cardiac Enzymes:  Recent Labs Lab 01/04/17 1049  TROPONINI <0.03   BNP: Invalid input(s): POCBNP CBG: No results for input(s): GLUCAP in the last 168 hours. HbA1C: No results for input(s): HGBA1C in the last 72 hours. Urine analysis:    Component Value Date/Time   COLORURINE AMBER BIOCHEMICALS MAY BE AFFECTED BY COLOR (A) 12/12/2010 0207   APPEARANCEUR CLOUDY (A) 12/12/2010 0207   LABSPEC 1.020 12/12/2010 0207   PHURINE 6.0 12/12/2010 0207   GLUCOSEU NEGATIVE 05/30/2008 2053   HGBUR TRACE (A) 12/12/2010 0207   BILIRUBINUR SMALL (A) 12/12/2010 0207   KETONESUR NEGATIVE 12/12/2010 0207   PROTEINUR 30 (A) 12/12/2010 0207   UROBILINOGEN 1.0 12/12/2010 0207   NITRITE NEGATIVE 12/12/2010 0207   LEUKOCYTESUR NEGATIVE 12/12/2010 0207   Sepsis Labs: @LABRCNTIP (procalcitonin:4,lacticidven:4) )No results found for this or any previous visit (from the past 240 hour(s)).   Scheduled Meds: . bisoprolol  2.5 mg Oral  Daily  . budesonide (PULMICORT) nebulizer solution  0.5 mg Nebulization BID  . enoxaparin (LOVENOX) injection  40 mg Subcutaneous Q24H  . furosemide  40 mg Oral Daily  . ipratropium-albuterol  3 mL Nebulization TID  . lisinopril  2.5 mg Oral Daily  . potassium chloride  20 mEq Oral Daily  . predniSONE  50 mg Oral Q breakfast  . sodium chloride flush  3 mL Intravenous Q12H  . sodium chloride flush  3 mL Intravenous Q12H   Continuous Infusions: . sodium chloride      Procedures/Studies: Dg Chest 2 View  Result Date: 01/07/2017 CLINICAL DATA:  CHF. EXAM: CHEST  2 VIEW COMPARISON:  01/04/2017. FINDINGS: Mediastinum hilar structures stable. Stable cardiomegaly. No evidence of overt congestive heart failure. Low lung volumes. IMPRESSION: 1. Stable chest. Stable cardiomegaly. No evidence of overt congestive heart failure. 2. Low lung volumes . Electronically Signed   By: Marcello Moores  Register   On: 01/07/2017 09:31   Dg Chest 2 View  Result Date: 01/04/2017 CLINICAL DATA:  Shortness of breath for 2-3 days, some chest pain EXAM: CHEST  2 VIEW COMPARISON:  Chest x-ray  10/13/2012 FINDINGS: No active infiltrate or effusion is seen. Mediastinal and hilar contours are unremarkable. Cardiomegaly is stable. No acute bony abnormality is seen. IMPRESSION: Stable cardiomegaly.  No active lung disease. Electronically Signed   By: Ivar Drape M.D.   On: 01/04/2017 11:16    Maday Guarino, DO  [facility] Pager [phone]  If 7PM-7AM, please contact night-coverage [email or web address] Password [email or web address] 01/10/2017, 7:54 AM   LOS: 5 days

## 2017-01-10 NOTE — Interval H&P Note (Signed)
History and Physical Interval Note: Cath Lab Visit (complete for each Cath Lab visit)  Clinical Evaluation Leading to the Procedure:   ACS: No.  Non-ACS:    Anginal Classification: CCS III  Anti-ischemic medical therapy: No Therapy  Non-Invasive Test Results: No non-invasive testing performed  Prior CABG: No previous CABG       01/10/2017 8:52 AM  [name]  has presented today for surgery, with the diagnosis of cp  The various methods of treatment have been discussed with the patient and family. After consideration of risks, benefits and other options for treatment, the patient has consented to  Procedure(s): Right/Left Heart Cath and Coronary Angiography (N/A) as a surgical intervention .  The patient's history has been reviewed, patient examined, no change in status, stable for surgery.  I have reviewed the patient's chart and labs.  Questions were answered to the patient's satisfaction.     [name]

## 2017-01-10 NOTE — Progress Notes (Signed)
MD paged about increase in troponin to .[place]

## 2017-01-10 NOTE — H&P (View-Only) (Signed)
Subjective:  Denies SSCP, palpitations or Dyspnea Laying flat in bed   Objective:  Vitals:   01/10/17 0457 01/10/17 0618 01/10/17 0739 01/10/17 0744  BP:  (!) 104/57    Pulse:  76    Resp:  15    Temp:  97.8 F (36.6 C)    TempSrc:  Oral    SpO2:  94% 92% 92%  Weight: 255 lb 15.3 oz (116.1 kg)     Height:        Intake/Output from previous day:  Intake/Output Summary (Last 24 hours) at 01/10/17 0803 Last data filed at 01/10/17 [id number]  Gross per 24 hour  Intake              483 ml  Output             1450 ml  Net             -967 ml    Physical Exam: Affect appropriate Obese white female  HEENT: normal Neck supple with no adenopathy JVP normal no bruits no thyromegaly Lungs mild exp wheezing and good diaphragmatic motion Heart:  S1/S2 no murmur, no rub, gallop or click PMI normal Abdomen: benighn, BS positve, no tenderness, no AAA no bruit.  No HSM or HJR Distal pulses intact with no bruits No edema Neuro non-focal Skin warm and dry No muscular weakness   Lab Results: Basic Metabolic Panel:  Recent Labs  01/08/17 0602 01/09/17 0604 01/10/17 0457  NA 140 141 137  K 3.2* 4.0 4.3  CL 92* 95* 96*  CO2 40* 39* 32  GLUCOSE 88 108* 90  BUN 20 24* 25*  CREATININE 0.98 0.90 0.93  CALCIUM 8.3* 8.4* 8.4*  MG 2.0  --   --    Liver Function Tests:  Recent Labs  01/09/17 0604  AST 17  ALT 25  ALKPHOS 39  BILITOT 1.2  PROT 6.7  ALBUMIN 3.5   CBC:  Recent Labs  01/10/17 0457  WBC 19.3*  HGB 14.9  HCT 46.6*  MCV 96.3  PLT 254   Fasting Lipid Panel:  Recent Labs  01/08/17 0602  CHOL 210*  HDL 57  LDLCALC 129*  TRIG 118  CHOLHDL 3.7    Imaging: No results found.  Cardiac Studies:  ECG:  Orders placed or performed during the hospital encounter of 01/04/17  . EKG 12-Lead  . EKG 12-Lead  . EKG     Telemetry:  NsR 01/10/2017   Echo:  EF 35-40%  Mild MR   Medications:   . bisoprolol  2.5 mg Oral Daily  . budesonide  (PULMICORT) nebulizer solution  0.5 mg Nebulization BID  . enoxaparin (LOVENOX) injection  40 mg Subcutaneous Q24H  . furosemide  40 mg Oral Daily  . ipratropium-albuterol  3 mL Nebulization TID  . lisinopril  2.5 mg Oral Daily  . potassium chloride  20 mEq Oral Daily  . predniSONE  50 mg Oral Q breakfast  . sodium chloride flush  3 mL Intravenous Q12H  . sodium chloride flush  3 mL Intravenous Q12H     . sodium chloride      Assessment/Plan:   CHF:  See note by Dr Harrington Challenger 01/07/17  Transfer to [facility] for right and left heart cath Discussed with patient willing to proceed Transfer to Hospitalist service there And if we can get her there early enough will have cath today   COPD:  Improved less wheezing continue atrovent and pulmicort  Jenkins Rouge  01/10/2017, 8:03 AM

## 2017-01-10 NOTE — Progress Notes (Addendum)
MD notified to start EMTALA for pt transport to [facility]. MD notified to start transport certificate. Second IV initiated. Precath Aspirin administered. Shower given to Pt.

## 2017-01-10 NOTE — Progress Notes (Signed)
EMTALA filled out in EPIC  DTat

## 2017-01-10 NOTE — Progress Notes (Signed)
ANTICOAGULATION CONSULT NOTE - Initial Consult  Pharmacy Consult for IV heparin Indication: atrial fibrillation, coronary embolism  No Known Allergies  Patient Measurements: Height: 5\' 5"  (165.1 cm) Weight: 255 lb 15.3 oz (116.1 kg) IBW/kg (Calculated) : 57 Heparin Dosing Weight: ~ 84 kg  Vital Signs: Temp: 97.8 F (36.6 C) (02/05 0618) Temp Source: Oral (02/05 0618) BP: 105/77 (02/05 1305) Pulse Rate: 38 (02/05 1305)  Labs:  Recent Labs  01/08/17 0602 01/09/17 0604 01/10/17 0457  HGB  --   --  14.9  HCT  --   --  46.6*  PLT  --   --  254  LABPROT  --   --  12.8  INR  --   --  0.96  CREATININE 0.98 0.90 0.93    Estimated Creatinine Clearance: 76.7 mL/min (by C-G formula based on SCr of 0.93 mg/dL).   Medical History: Past Medical History:  Diagnosis Date  . Acid reflux     Medications:  . sodium chloride 50 mL/hr at 01/10/17 1059  . amiodarone 60 mg/hr (01/10/17 1220)   Followed by  . amiodarone    . heparin      Assessment: 66 yo female transferred from [facility] to [facility] for cardiac cath.  Developed afib in cath lab, also with "100% distal/apical LAD stenosis. This lesion is probably chronic but difficult to determine age. There is also a possibility that it is acute embolic occlusion from a catheter-based thrombus".  Pharmacy asked to begin IV heparin in cath recovery area, no bolus.  Confirmed this plan with Dr. Tamala Julian.  Baseline CBC WNL.  Goal of Therapy:  Heparin level 0.3-0.7 units/ml Monitor platelets by anticoagulation protocol: Yes   Plan:  1. Start IV heparin 850 units/hr now.  No bolus. 2. Check heparin level in 6 hrs. 3. Daily heparin level and CBC. 4. F/u plans for oral anticoagulation eventually.  Uvaldo Rising, BCPS  Clinical Pharmacist Pager [phone]  01/10/2017 1:20 PM

## 2017-01-11 ENCOUNTER — Encounter (HOSPITAL_COMMUNITY): Payer: Self-pay | Admitting: Interventional Cardiology

## 2017-01-11 DIAGNOSIS — I480 Paroxysmal atrial fibrillation: Secondary | ICD-10-CM

## 2017-01-11 DIAGNOSIS — I428 Other cardiomyopathies: Secondary | ICD-10-CM

## 2017-01-11 LAB — CBC
HEMATOCRIT: 47.3 % — AB (ref 36.0–46.0)
HEMOGLOBIN: 14.9 g/dL (ref 12.0–15.0)
MCH: 30.3 pg (ref 26.0–34.0)
MCHC: 31.5 g/dL (ref 30.0–36.0)
MCV: 96.3 fL (ref 78.0–100.0)
Platelets: 236 10*3/uL (ref 150–400)
RBC: 4.91 MIL/uL (ref 3.87–5.11)
RDW: 14 % (ref 11.5–15.5)
WBC: 16.1 10*3/uL — ABNORMAL HIGH (ref 4.0–10.5)

## 2017-01-11 LAB — BASIC METABOLIC PANEL
ANION GAP: 10 (ref 5–15)
BUN: 20 mg/dL (ref 6–20)
CHLORIDE: 95 mmol/L — AB (ref 101–111)
CO2: 36 mmol/L — AB (ref 22–32)
Calcium: 8.9 mg/dL (ref 8.9–10.3)
Creatinine, Ser: 1.07 mg/dL — ABNORMAL HIGH (ref 0.44–1.00)
GFR calc Af Amer: >60 mL/min (ref >60)
GFR, EST NON AFRICAN AMERICAN: 53 mL/min — AB (ref >60)
GLUCOSE: 95 mg/dL (ref 65–99)
POTASSIUM: 4.9 mmol/L (ref 3.5–5.1)
Sodium: 141 mmol/L (ref 135–145)

## 2017-01-11 LAB — TROPONIN I
Troponin I: 2.24 ng/mL (ref <0.03)
Troponin I: 1.4 ng/mL (ref <0.03)
Troponin I: 1.19 ng/mL (ref <0.03)

## 2017-01-11 LAB — HEPARIN LEVEL (UNFRACTIONATED)
HEPARIN UNFRACTIONATED: 0.77 [IU]/mL — AB (ref 0.30–0.70)
Heparin Unfractionated: <0.1 IU/mL — ABNORMAL LOW (ref 0.30–0.70)
Heparin Unfractionated: <0.1 IU/mL — ABNORMAL LOW (ref 0.30–0.70)

## 2017-01-11 LAB — MAGNESIUM: Magnesium: 2.1 mg/dL (ref 1.7–2.4)

## 2017-01-11 MED ORDER — BISOPROLOL FUMARATE 5 MG PO TABS
2.5000 mg | ORAL_TABLET | Freq: Once | ORAL | Status: AC
  Administered 2017-01-11: 2.5 mg via ORAL
  Filled 2017-01-11: qty 1

## 2017-01-11 MED ORDER — HEPARIN (PORCINE) IN NACL 100-0.45 UNIT/ML-% IJ SOLN
1750.0000 [IU]/h | INTRAMUSCULAR | Status: DC
  Administered 2017-01-11: 1800 [IU]/h via INTRAVENOUS
  Filled 2017-01-11: qty 250

## 2017-01-11 MED ORDER — LISINOPRIL 2.5 MG PO TABS
2.5000 mg | ORAL_TABLET | Freq: Once | ORAL | Status: AC
  Administered 2017-01-11: 2.5 mg via ORAL
  Filled 2017-01-11: qty 1

## 2017-01-11 MED ORDER — LISINOPRIL 5 MG PO TABS: 5.0000 mg | ORAL_TABLET | Freq: Every day | ORAL | Status: DC

## 2017-01-11 MED ORDER — BISOPROLOL FUMARATE 5 MG PO TABS: 5.0000 mg | ORAL_TABLET | Freq: Every day | ORAL | Status: DC

## 2017-01-11 MED ORDER — FUROSEMIDE 10 MG/ML IJ SOLN
40.0000 mg | Freq: Once | INTRAMUSCULAR | Status: AC
  Administered 2017-01-11: 40 mg via INTRAVENOUS
  Filled 2017-01-11: qty 4

## 2017-01-11 MED FILL — Tirofiban HCl in NaCl 0.9% IV Soln 5 MG/100ML (Base Equiv): INTRAVENOUS | Qty: 100 | Status: AC

## 2017-01-11 NOTE — Progress Notes (Signed)
ANTICOAGULATION CONSULT NOTE - Follow Up Consult  Pharmacy Consult for Heparin Indication: atrial fibrillation, coronary embolism  No Known Allergies  Patient Measurements: Height: 5\' 5"  (165.1 cm) Weight: 245 lb 13 oz (111.5 kg) IBW/kg (Calculated) : 57 Heparin Dosing Weight: ~ 84 kg  Vital Signs: Temp: 98.2 F (36.8 C) (02/06 1139) Temp Source: Oral (02/06 1139) BP: 145/78 (02/06 1200) Pulse Rate: 66 (02/06 1434)  Labs:  Recent Labs  01/09/17 0604 01/10/17 0457 01/10/17 1642 01/10/17 1928 01/10/17 2217 01/11/17 0234 01/11/17 0841 01/11/17 1348  HGB  --  14.9  --   --   --  14.9  --   --   HCT  --  46.6*  --   --   --  47.3*  --   --   PLT  --  254  --   --   --  236  --   --   LABPROT  --  12.8  --   --   --   --   --   --   INR  --  0.96  --   --   --   --   --   --   HEPARINUNFRC  --   --   --  <0.10*  --  <0.10*  --  <0.10*  CREATININE 0.90 0.93  --   --   --  1.07*  --   --   TROPONINI  --   --  0.04*  --  0.47*  --  2.24*  --     Estimated Creatinine Clearance: 65.2 mL/min (by C-G formula based on SCr of 1.07 mg/dL (H)).  Medications: Heparin @ 1500 units/hr  Assessment: 66yof s/p cath 2/5 continues on heparin for afib and acute coronary embolism. Heparin level remains undetectable. No issues with infusion per RN and heparin hasn't been turned off for any reason. CBC remains stable. No bleeding reported.  Goal of Therapy:  Heparin level 0.3-0.7 units/ml Monitor platelets by anticoagulation protocol: Yes   Plan:  1) Increase heparin to 1800 units/hr 2) Check 8 hour heparin level  Nena Jordan, PharmD, BCPS 01/11/2017 2:53 PM

## 2017-01-11 NOTE — Evaluation (Signed)
Physical Therapy Evaluation Patient Details Name: Pamela Soto MRN: [id number] DOB: 11-28-1951 Today's Date: 01/11/2017   History of Present Illness  Pt with respiratory failure and cardiomyopathy/acute CHF. PMH - copd,   Clinical Impression  Pt admitted with above diagnosis and presents to PT with functional limitations due to deficits listed below (See PT problem list). Pt needs skilled PT to maximize independence and safety to allow discharge to home with occasional assist of family. Expect pt will make good progress and be able to return to prior living situation.     Follow Up Recommendations No PT follow up;Supervision - Intermittent    Equipment Recommendations  None recommended by PT    Recommendations for Other Services       Precautions / Restrictions Precautions Precautions: Other (comment) Precaution Comments: watch SpO2 Restrictions Weight Bearing Restrictions: No      Mobility  Bed Mobility Overal bed mobility: Modified Independent                Transfers Overall transfer level: Needs assistance Equipment used: None Transfers: Sit to/from Stand;Stand Pivot Transfers Sit to Stand: Supervision Stand pivot transfers: Supervision       General transfer comment: for safety  Ambulation/Gait Ambulation/Gait assistance: Min guard Ambulation Distance (Feet): 150 Feet Assistive device: None Gait Pattern/deviations: Step-through pattern;Decreased stride length;Drifts right/left Gait velocity: decr Gait velocity interpretation: Below normal speed for age/gender General Gait Details: Slightly unsteady gait requiring assist for balance. Amb on 2L of O2. Unable to get accurate SpO2 reading  Stairs            Wheelchair Mobility    Modified Rankin (Stroke Patients Only)       Balance Overall balance assessment: Needs assistance Sitting-balance support: No upper extremity supported;Feet supported Sitting balance-Leahy Scale: Normal      Standing balance support: No upper extremity supported;During functional activity Standing balance-Leahy Scale: Fair                               Pertinent Vitals/Pain Pain Assessment: No/denies pain    Home Living Family/patient expects to be discharged to:: Private residence Living Arrangements: Alone Available Help at Discharge: Family;Available PRN/intermittently Type of Home: House Home Access: Level entry     Home Layout: Two level;Laundry or work area in [employer]: None      Prior Function Level of Independence: Independent               Journalist, newspaper        Extremity/Trunk Assessment   Upper Extremity Assessment Upper Extremity Assessment: Overall WFL for tasks assessed    Lower Extremity Assessment Lower Extremity Assessment: Generalized weakness       Communication   Communication: No difficulties  Cognition Arousal/Alertness: Awake/alert Behavior During Therapy: WFL for tasks assessed/performed Overall Cognitive Status: Within Functional Limits for tasks assessed                      General Comments      Exercises     Assessment/Plan    PT Assessment Patient needs continued PT services  PT Problem List Decreased strength;Decreased activity tolerance;Decreased balance;Decreased mobility;Cardiopulmonary status limiting activity          PT Treatment Interventions DME instruction;Gait training;Functional mobility training;Stair training;Therapeutic activities;Therapeutic exercise;Balance training;Patient/family education    PT Goals (Current goals can be found in the Care Plan section)  Acute Rehab PT Goals Patient Stated Goal:  return home PT Goal Formulation: With patient Time For Goal Achievement: 01/18/17 Potential to Achieve Goals: Good    Frequency Min 3X/week   Barriers to discharge Decreased caregiver support Lives alone    Co-evaluation               End of Session Equipment  Utilized During Treatment: Oxygen Activity Tolerance: Patient limited by fatigue Patient left: in chair;with call bell/phone within reach Nurse Communication: Mobility status         Time: 1441-1459 PT Time Calculation (min) (ACUTE ONLY): 18 min   Charges:   PT Evaluation $PT Eval Moderate Complexity: 1 Procedure     PT G CodesShary Decamp [facility] 01/30/2017, 3:34 PM [employer] PT [phone]

## 2017-01-11 NOTE — Progress Notes (Signed)
PROGRESS NOTE    Pamela Soto  [id number] DOB: [birth year] DOA: 01/04/2017 PCP: No PCP Per Patient   Brief Narrative:  66 year old BF PMHx COPD, GERD, morbid obesity  Presents with a 2 year history of shortness breath that has worsened over 2weeksprior to admission. The patient was noted to be wheezing and hypoxemic in the emergency department with saturation in the upper 80s. She was given bronchodilators and IV steroids with some improvement in the ED, but the patient remains dyspneic on exertion. The patient denied any worsening lower extremity edema, but complained of increasing abdominal girth. She denied any fevers, chills, chest pain, nausea, vomiting, diarrhea. The patient was treated for COPD exacerbation, but initial history and exam suggested respiratory failure from a degree of fluid overload as well as multifactorial etiologies. The patient was treated with intravenous furosemide with good clinical effect, and her furosemide has been de-escalated to po. Cardiology was consulted for newly diagnosed cardiomyopathy with EF 35-40 percent. The patient was transferred to [facility] on 01/10/2017 for heart catheterization.   Subjective: 2/6 A/O 4, NAD. Sitting in chair comfortably.   Assessment & Plan:   Principal Problem:   Acute systolic heart failure (HCC) Active Problems:   COPD exacerbation (HCC)   Acute respiratory failure with hypoxia (HCC)   Hyperglycemia   Acute exacerbation of chronic obstructive pulmonary disease (COPD) (HCC)   Impaired glucose tolerance   Paroxysmal atrial fibrillation (HCC)   Coronary artery disease involving native coronary artery of native heart   Acute respiratory failure with hypoxia -Multifactorial including COPD exacerbation,OSA/OHS, CHF/cardiomyopathy, and deconditioning -Continue duo nebs -Continue prednisone--start taper -Flutter valve -D-Dimer--0.30 -stable on 4 L-->2LNC presently--93-96% -pt may have to go home on  oxygen  COPD exacerbation -As above-->improved -Finished 5 daysazithromycin -wean prednisone -continuepulmicort  Cardiomyopathy/Acute Systolic CHF -The patient initiallyclinically fluid overloaded with JVD and pulm vasc congestion on CXR -volume status betterafter IV lasix but somewhat difficult to assess due to body habitus -continue maintenance furosemide 40 mg daily -able to lay flat -Echo--EF 35-40 percent, diffuse HK, mild MR -S/P cardiac catheterization on 2/5 see results below -Care per cardiology: Per note plan on diuresis -Counseled patient and family on consuming only heart healthy diet (hold bag of salted nuts on table) -Strict I&O -Daily weight  Morbid obesity -BMI 42.5 -lifestyle modification  Impaired glucose Tolerance -check A1C--6.1  Leukocytosis -due to steroids -no fever; hemodynamically stable  Goals of care -PT/OT consult placed: CIR vs SNF   DVT prophylaxis: Heparin drip Code Status: Full Family Communication: Son present for discussion of plan Disposition Plan: per cardiology   Consultants:  Cardiology  Procedures/Significant Events:  2/5 cardiac catheterization:-40% proximal RCA stenosis. -Tortuous but normal circumflex -100% distal/apical LAD stenosis. This lesion is probably chronic but difficult to determine age. There is also a possibility that it is acute embolic occlusion from a catheter-based thrombus. -Severe LV dysfunction with EF less than 25%. Contraction pattern appears global   VENTILATOR SETTINGS:    Cultures   Antimicrobials:    Devices    LINES / TUBES:      Continuous Infusions: . heparin 1,800 Units/hr (01/11/17 1600)     Objective: Vitals:   01/11/17 1300 01/11/17 1400 01/11/17 1434 01/11/17 1624  BP: (!) 145/70 (!) 157/79  (!) 162/95  Pulse: 70 71 66 (!) 59  Resp: 15 (!) 27 17 16   Temp:    98.2 F (36.8 C)  TempSrc:    Oral  SpO2: 95% 95% 96% 93%  Weight:  Height:         Intake/Output Summary (Last 24 hours) at 01/11/17 1716 Last data filed at 01/11/17 1600  Gross per 24 hour  Intake          1121.82 ml  Output              500 ml  Net           621.82 ml   Filed Weights   01/04/17 1613 01/10/17 0457 01/10/17 1712  Weight: 115.5 kg (254 lb 11.2 oz) 116.1 kg (255 lb 15.3 oz) 111.5 kg (245 lb 13 oz)    Examination:  General: A/O 4, NAD, No acute respiratory distress Eyes: negative scleral hemorrhage, negative anisocoria, negative icterus ENT: Negative Runny nose, negative gingival bleeding, Neck:  Negative scars, masses, torticollis, lymphadenopathy, JVD Lungs: Clear to auscultation bilaterally without wheezes or crackles Cardiovascular: Regular rate and rhythm without murmur gallop or rub normal S1 and S2 Abdomen: MORBIDLY OBESE, negative abdominal pain, nondistended, positive soft, bowel sounds, no rebound, no ascites, no appreciable mass Extremities: mild bilateral lower extremity pedal edema +1  Skin: Negative rashes, lesions, ulcers Psychiatric:  Negative depression, negative anxiety, negative fatigue, negative mania  Central nervous system:  Cranial nerves II through XII intact, tongue/uvula midline, all extremities muscle strength 5/5, sensation intact throughout, negative dysarthria, negative expressive aphasia, negative receptive aphasia.  .     Data Reviewed: Care during the described time interval was provided by me .  I have reviewed this patient's available data, including medical history, events of note, physical examination, and all test results as part of my evaluation. I have personally reviewed and interpreted all radiology studies.  CBC:  Recent Labs Lab 01/05/17 0438 01/10/17 0457 01/11/17 0234  WBC 13.1* 19.3* 16.1*  HGB 14.1 14.9 14.9  HCT 44.9 46.6* 47.3*  MCV 94.5 96.3 96.3  PLT 243 254 [id number]   Basic Metabolic Panel:  Recent Labs Lab 01/06/17 0603 01/07/17 0607 01/08/17 0602 01/09/17 0604 01/10/17 0457  01/11/17 0234  NA 141 140 140 141 137 141  K 4.5 3.9 3.2* 4.0 4.3 4.9  CL 97* 96* 92* 95* 96* 95*  CO2 38* 38* 40* 39* 32 36*  GLUCOSE 104* 92 88 108* 90 95  BUN 20 18 20  24* 25* 20  CREATININE 1.02* 0.80 0.98 0.90 0.93 1.07*  CALCIUM 8.3* 8.1* 8.3* 8.4* 8.4* 8.9  MG 2.2 1.9 2.0  --   --  2.1   GFR: Estimated Creatinine Clearance: 65.2 mL/min (by C-G formula based on SCr of 1.07 mg/dL (H)). Liver Function Tests:  Recent Labs Lab 01/09/17 0604  AST 17  ALT 25  ALKPHOS 39  BILITOT 1.2  PROT 6.7  ALBUMIN 3.5   No results for input(s): LIPASE, AMYLASE in the last 168 hours. No results for input(s): AMMONIA in the last 168 hours. Coagulation Profile:  Recent Labs Lab 01/10/17 0457  INR 0.96   Cardiac Enzymes:  Recent Labs Lab 01/10/17 1642 01/10/17 2217 01/11/17 0841 01/11/17 1348  TROPONINI 0.04* 0.47* 2.24* 1.40*   BNP (last 3 results) No results for input(s): PROBNP in the last 8760 hours. HbA1C: No results for input(s): HGBA1C in the last 72 hours. CBG: No results for input(s): GLUCAP in the last 168 hours. Lipid Profile: No results for input(s): CHOL, HDL, LDLCALC, TRIG, CHOLHDL, LDLDIRECT in the last 72 hours. Thyroid Function Tests: No results for input(s): TSH, T4TOTAL, FREET4, T3FREE, THYROIDAB in the last 72 hours. Anemia Panel: No results for  input(s): VITAMINB12, FOLATE, FERRITIN, TIBC, IRON, RETICCTPCT in the last 72 hours. Urine analysis:    Component Value Date/Time   COLORURINE AMBER BIOCHEMICALS MAY BE AFFECTED BY COLOR (A) 12/12/2010 0207   APPEARANCEUR CLOUDY (A) 12/12/2010 0207   LABSPEC 1.020 12/12/2010 0207   PHURINE 6.0 12/12/2010 0207   GLUCOSEU NEGATIVE 05/30/2008 2053   HGBUR TRACE (A) 12/12/2010 0207   BILIRUBINUR SMALL (A) 12/12/2010 0207   KETONESUR NEGATIVE 12/12/2010 0207   PROTEINUR 30 (A) 12/12/2010 0207   UROBILINOGEN 1.0 12/12/2010 0207   NITRITE NEGATIVE 12/12/2010 0207   LEUKOCYTESUR NEGATIVE 12/12/2010 0207    Sepsis Labs: @LABRCNTIP (procalcitonin:4,lacticidven:4)  ) Recent Results (from the past 240 hour(s))  MRSA PCR Screening     Status: None   Collection Time: 01/10/17  4:52 PM  Result Value Ref Range Status   MRSA by PCR NEGATIVE NEGATIVE Final    Comment:        The GeneXpert MRSA Assay (FDA approved for NASAL specimens only), is one component of a comprehensive MRSA colonization surveillance program. It is not intended to diagnose MRSA infection nor to guide or monitor treatment for MRSA infections.          Radiology Studies: No results found.      Scheduled Meds: . aspirin  81 mg Oral Daily  . atorvastatin  80 mg Oral q1800  . [START ON 01/12/2017] bisoprolol  5 mg Oral Daily  . budesonide (PULMICORT) nebulizer solution  0.5 mg Nebulization BID  . furosemide  40 mg Oral Daily  . ipratropium-albuterol  3 mL Nebulization TID  . [START ON 01/12/2017] lisinopril  5 mg Oral Daily  . potassium chloride  20 mEq Oral Daily  . predniSONE  50 mg Oral Q breakfast  . sodium chloride flush  3 mL Intravenous Q12H  . sodium chloride flush  3 mL Intravenous Q12H   Continuous Infusions: . heparin 1,800 Units/hr (01/11/17 1600)     LOS: 6 days    Time spent: 40 minutes    Trenese Haft, Geraldo Docker, MD [facility] Pager [phone]   If 7PM-7AM, please contact night-coverage [email or web address] Password [email or web address] 01/11/2017, 5:16 PM

## 2017-01-11 NOTE — Progress Notes (Signed)
ANTICOAGULATION CONSULT NOTE - Follow Up Consult  Pharmacy Consult for Heparin Indication: atrial fibrillation, coronary embolism  No Known Allergies  Patient Measurements: Height: 5\' 5"  (165.1 cm) Weight: 245 lb 13 oz (111.5 kg) IBW/kg (Calculated) : 57 Heparin Dosing Weight: ~ 84 kg  Vital Signs: Temp: 98.3 F (36.8 C) (02/06 2032) Temp Source: Oral (02/06 2032) BP: 128/79 (02/06 2032) Pulse Rate: 64 (02/06 2032)  Labs:  Recent Labs  01/09/17 0604 01/10/17 0457  01/10/17 2217 01/11/17 0234 01/11/17 0841 01/11/17 1348 01/11/17 2040  HGB  --  14.9  --   --  14.9  --   --   --   HCT  --  46.6*  --   --  47.3*  --   --   --   PLT  --  254  --   --  236  --   --   --   LABPROT  --  12.8  --   --   --   --   --   --   INR  --  0.96  --   --   --   --   --   --   HEPARINUNFRC  --   --   < >  --  <0.10*  --  <0.10* 0.77*  CREATININE 0.90 0.93  --   --  1.07*  --   --   --   TROPONINI  --   --   < > 0.47*  --  2.24* 1.40*  --   < > = values in this interval not displayed.  Estimated Creatinine Clearance: 65.2 mL/min (by C-G formula based on SCr of 1.07 mg/dL (H)).   Assessment: 66yof s/p cath 2/5 continues on heparin for afib and acute coronary embolism. Heparin level remains undetectable. No issues with infusion per RN and heparin hasn't been turned off for any reason. CBC remains stable. No bleeding reported.  Repeat HL is now slightly supratherapeutic at 0.77 on heparin 1800 units/hr. Nurse reports no issues with infusion or bleeding.  Goal of Therapy:  Heparin level 0.3-0.7 units/ml Monitor platelets by anticoagulation protocol: Yes   Plan:  Decrease heparin to 1750 units/hr Daily HL/CBC   Andrey Cota. Diona Foley, PharmD, [place] Clinical Pharmacist [phone] 01/11/2017 9:30 PM

## 2017-01-11 NOTE — Progress Notes (Addendum)
Subjective:  Patient reports having chest pressure during her cath procedure yesterday. Denies having any CP, SOB, or palpitations since after the procedure. No other complaints.   Objective:  Vitals:   01/10/17 2000 01/11/17 0009 01/11/17 0407 01/11/17 0756  BP: (!) 159/76 131/71 (!) 158/76 (!) 142/78  Pulse: 76 74 65 77  Resp: 19 14 15 18   Temp: 98.1 F (36.7 C) 97.6 F (36.4 C) 98.6 F (37 C) 98.4 F (36.9 C)  TempSrc: Oral Oral Oral Oral  SpO2: 95% 99% 97% 95%  Weight:      Height:        Intake/Output from previous day:  Intake/Output Summary (Last 24 hours) at 01/11/17 0806 Last data filed at 01/11/17 0800  Gross per 24 hour  Intake           732.98 ml  Output              901 ml  Net          -168.02 ml    Physical Exam: Obese white female  JVP normal no bruits no thyromegaly Heart:  S1/S2. No murmur, no rub, gallop or click. No pedal edema.  Lungs: Mild inspiratory wheezing. No rales or rhonchi.  Abdomen: +BS positve, no tenderness Neuro: non-focal Skin: warm and dry MSK: No muscular weakness   Lab Results: Basic Metabolic Panel:  Recent Labs  01/10/17 0457 01/11/17 0234  NA 137 141  K 4.3 4.9  CL 96* 95*  CO2 32 36*  GLUCOSE 90 95  BUN 25* 20  CREATININE 0.93 1.07*  CALCIUM 8.4* 8.9  MG  --  2.1   Liver Function Tests:  Recent Labs  01/09/17 0604  AST 17  ALT 25  ALKPHOS 39  BILITOT 1.2  PROT 6.7  ALBUMIN 3.5   CBC:  Recent Labs  01/10/17 0457 01/11/17 0234  WBC 19.3* 16.1*  HGB 14.9 14.9  HCT 46.6* 47.3*  MCV 96.3 96.3  PLT 254 236   Cardiac Studies:  ECG:  Orders placed or performed during the hospital encounter of 01/04/17  . EKG 12-Lead  . EKG 12-Lead  . EKG  . EKG 12-Lead  . EKG 12-Lead  . EKG 12-Lead  . EKG 12-Lead  . EKG 12-Lead  . EKG 12-Lead    Telemetry:  NSR 01/11/2017  Echo:  EF 35-40%  Mild MR   R/ LHC 01/10/17:  Dist LAD lesion, 100 %stenosed.  Prox RCA lesion, 40 %stenosed.  3rd Mrg  lesion, 40 %stenosed.  There is severe left ventricular systolic dysfunction.  The left ventricular ejection fraction is less than 25% by visual estimate.  LV end diastolic pressure is severely elevated.  Hemodynamic findings consistent with moderate pulmonary hypertension.    Acute atrial fibrillation developed on the cath table during right heart/right atrial pressure recording. Rate 118 bpm. Atrial fibrillation was treated with one bolus of IV amiodarone.  40% proximal RCA stenosis.  Tortuous but normal circumflex  100% distal/apical LAD stenosis. This lesion is probably chronic but difficult to determine age. There is also a possibility that it is acute embolic occlusion from a catheter-based thrombus. For that reason additional heparin and then Aggrastat bolus was given as the patient complained of chest pressure post procedure.  Chest pressure post procedure is similar to prior episodes.  Severe LV dysfunction with EF less than 25%. Contraction pattern appears global.  Arterial blood gas demonstrated severe CO2 retention, pCO2 67.  Medications:   . aspirin  81 mg Oral Daily  . atorvastatin  80 mg Oral q1800  . bisoprolol  2.5 mg Oral Daily  . budesonide (PULMICORT) nebulizer solution  0.5 mg Nebulization BID  . furosemide  40 mg Oral Daily  . ipratropium-albuterol  3 mL Nebulization TID  . lisinopril  2.5 mg Oral Daily  . potassium chloride  20 mEq Oral Daily  . predniSONE  50 mg Oral Q breakfast  . sodium chloride flush  3 mL Intravenous Q12H  . sodium chloride flush  3 mL Intravenous Q12H     . amiodarone 30 mg/hr (01/11/17 0600)  . heparin 1,500 Units/hr (01/11/17 0600)    Assessment/Plan:   Cardiomyopathy/acute CHF: Likely non-ischemic in etiology. R/ LHC showing 100% distal LAD stenosis, 40% proximal RCA stenosis, 40% 3rd marginal stenosis, severe LV systolic dysfunction with EF <25%, severely elevated LV EDP, and moderate pulmonary HTN. Continue medical  management.  -Oral Lasix 40 mg daily  -Continue Bisoprolol 2.5 mg daily -Continue Lisinopril 2.5 mg daily   Acute Afib:  Patient developed acute Afib during the procedure and was given a bolus of IV Amiodarone. Troponin 0.04> 0.47. In NSR now.  -Continue to cycle troponins  -Continue Bisoprolol 2.5 mg daily  -Discontinue IV Amiodarone -Continue IV heparin  HLD: Continue Lipitor 80 mg daily   COPD: continue atrovent and pulmicort    Vasundhra Rathore 01/11/2017, 8:06 AM   I have personally seen and examined this patient with Dr. Marlowe Sax. I agree with the assessment and plan as outlined above. She has a non-ischemic cardiomyopathy. We will titrate her beta blocker and Ace-inh. LVEDP was elevated by cath so will continue Lasix. Will give one time dose of IV Lasix today. She has no dyspnea this am but is coughing. ? Component of COPD. BP is elevated this am. Titrate meds as above. She had new onset atrial fib during cath. Sinus this am. D/c IV amiodarone. Will likely need long term anti-coagulation for now. Will continue IV heparin today. Will start Eliquis tomorrow. Medical management of CAD. Chronic occlusion distal LAD. No good options for PCI  Will leave in stepdown today while diuresing.   Lauree Chandler 01/11/2017 10:45 AM

## 2017-01-11 NOTE — Progress Notes (Signed)
ANTICOAGULATION CONSULT NOTE  Pharmacy Consult for heparin Indication: atrial fibrillation, coronary embolism  No Known Allergies  Patient Measurements: Height: 5\' 5"  (165.1 cm) Weight: 245 lb 13 oz (111.5 kg) IBW/kg (Calculated) : 57 Heparin Dosing Weight: ~ 84 kg  Vital Signs: Temp: 97.6 F (36.4 C) (02/06 0009) Temp Source: Oral (02/06 0009) BP: 131/71 (02/06 0009) Pulse Rate: 74 (02/06 0009)  Labs:  Recent Labs  01/08/17 0602 01/09/17 0604 01/10/17 0457 01/10/17 1642 01/10/17 1928 01/10/17 2217 01/11/17 0234  HGB  --   --  14.9  --   --   --  14.9  HCT  --   --  46.6*  --   --   --  47.3*  PLT  --   --  254  --   --   --  236  LABPROT  --   --  12.8  --   --   --   --   INR  --   --  0.96  --   --   --   --   HEPARINUNFRC  --   --   --   --  <0.10*  --  <0.10*  CREATININE 0.98 0.90 0.93  --   --   --   --   TROPONINI  --   --   --  0.04*  --  0.47*  --     Estimated Creatinine Clearance: 75 mL/min (by C-G formula based on SCr of 0.93 mg/dL).  Assessment: 66 yo female s/p cath, with possible embolism and new onset Afib, for heparin  Goal of Therapy:  Heparin level 0.3-0.7 units/ml Monitor platelets by anticoagulation protocol: Yes   Plan:  Increase Heparin 1500 units/hr Check heparin level in 8 hours.  Phillis Knack, PharmD, BCPS  01/11/2017 3:47 AM

## 2017-01-11 NOTE — Care Management Note (Signed)
Case Management Note  Patient Details  Name: Cathrina Moorehead MRN: [id number] Date of Birth: 28-Jun-1951  Subjective/Objective:     Adm w chf               Action/Plan: lives at home  Expected Discharge Date:                  Expected Discharge Plan:  [place]  In-House Referral:  NA  Discharge planning Services  CM Consult  Post Acute Care Choice:  NA Choice offered to:  NA  DME Arranged:    DME Agency:     HH Arranged:    [place] Agency:     Status of Service:  In process, will continue to follow  If discussed at Long Length of Stay Meetings, dates discussed:    Additional Comments: will moniter for dc needs as pt progresses. May need hhrn.  Lacretia Leigh, RN 01/11/2017, 9:52 AM

## 2017-01-12 DIAGNOSIS — I25110 Atherosclerotic heart disease of native coronary artery with unstable angina pectoris: Secondary | ICD-10-CM

## 2017-01-12 LAB — BASIC METABOLIC PANEL
ANION GAP: 10 (ref 5–15)
BUN: 20 mg/dL (ref 6–20)
CALCIUM: 8.7 mg/dL — AB (ref 8.9–10.3)
CO2: 36 mmol/L — ABNORMAL HIGH (ref 22–32)
CREATININE: 1.08 mg/dL — AB (ref 0.44–1.00)
Chloride: 93 mmol/L — ABNORMAL LOW (ref 101–111)
GFR calc non Af Amer: 53 mL/min — ABNORMAL LOW (ref >60)
Glucose, Bld: 115 mg/dL — ABNORMAL HIGH (ref 65–99)
Potassium: 4.5 mmol/L (ref 3.5–5.1)
SODIUM: 139 mmol/L (ref 135–145)

## 2017-01-12 LAB — CBC
HEMATOCRIT: 43.8 % (ref 36.0–46.0)
Hemoglobin: 13.9 g/dL (ref 12.0–15.0)
MCH: 30.1 pg (ref 26.0–34.0)
MCHC: 31.7 g/dL (ref 30.0–36.0)
MCV: 94.8 fL (ref 78.0–100.0)
PLATELETS: 249 10*3/uL (ref 150–400)
RBC: 4.62 MIL/uL (ref 3.87–5.11)
RDW: 13.5 % (ref 11.5–15.5)
WBC: 16.9 10*3/uL — AB (ref 4.0–10.5)

## 2017-01-12 LAB — TROPONIN I: Troponin I: 1.33 ng/mL (ref <0.03)

## 2017-01-12 LAB — MAGNESIUM: MAGNESIUM: 2.1 mg/dL (ref 1.7–2.4)

## 2017-01-12 LAB — HEPARIN LEVEL (UNFRACTIONATED): Heparin Unfractionated: 1.04 IU/mL — ABNORMAL HIGH (ref 0.30–0.70)

## 2017-01-12 MED ORDER — BISOPROLOL FUMARATE 5 MG PO TABS
2.5000 mg | ORAL_TABLET | Freq: Every day | ORAL | Status: DC
  Administered 2017-01-12 – 2017-01-14 (×3): 2.5 mg via ORAL
  Filled 2017-01-12 (×3): qty 1

## 2017-01-12 MED ORDER — HEPARIN (PORCINE) IN NACL 100-0.45 UNIT/ML-% IJ SOLN
1550.0000 [IU]/h | INTRAMUSCULAR | Status: DC
  Administered 2017-01-12: 1550 [IU]/h via INTRAVENOUS

## 2017-01-12 MED ORDER — PREDNISONE 20 MG PO TABS
20.0000 mg | ORAL_TABLET | Freq: Every day | ORAL | Status: DC
  Administered 2017-01-13: 20 mg via ORAL
  Filled 2017-01-12: qty 1

## 2017-01-12 MED ORDER — LISINOPRIL 10 MG PO TABS
10.0000 mg | ORAL_TABLET | Freq: Every day | ORAL | Status: DC
  Administered 2017-01-12 – 2017-01-14 (×3): 10 mg via ORAL
  Filled 2017-01-12 (×3): qty 1

## 2017-01-12 MED ORDER — APIXABAN 5 MG PO TABS
5.0000 mg | ORAL_TABLET | Freq: Two times a day (BID) | ORAL | Status: DC
  Administered 2017-01-12 – 2017-01-14 (×5): 5 mg via ORAL
  Filled 2017-01-12 (×5): qty 1

## 2017-01-12 MED ORDER — CLOPIDOGREL BISULFATE 75 MG PO TABS
75.0000 mg | ORAL_TABLET | Freq: Every day | ORAL | Status: DC
  Administered 2017-01-13 – 2017-01-14 (×2): 75 mg via ORAL
  Filled 2017-01-12 (×2): qty 1

## 2017-01-12 MED ORDER — CLOPIDOGREL BISULFATE 300 MG PO TABS
600.0000 mg | ORAL_TABLET | Freq: Once | ORAL | Status: AC
  Administered 2017-01-12: 600 mg via ORAL
  Filled 2017-01-12: qty 2

## 2017-01-12 MED ORDER — CLOPIDOGREL BISULFATE 300 MG PO TABS: 600.0000 mg | ORAL_TABLET | Freq: Every day | ORAL | Status: DC

## 2017-01-12 NOTE — Evaluation (Signed)
Occupational Therapy Evaluation and Discharge Patient Details Name: Pamela Soto MRN: [id number] DOB: 1951/04/14 Today's Date: 01/12/2017    History of Present Illness Pt with respiratory failure and cardiomyopathy/acute CHF. PMH - copd,    Clinical Impression   Pt is functioning near her baseline in ADL. Educated in energy conservation and availability of AE for LB ADL. Pt receptive and verbalized understanding of all information. Pt is not interested in practicing use of AE. She reports she is has reliable assistance of her children for IADL. No further OT needs.    Follow Up Recommendations  No OT follow up    Equipment Recommendations       Recommendations for Other Services       Precautions / Restrictions Precautions Precautions: None Restrictions Weight Bearing Restrictions: No      Mobility Bed Mobility Overal bed mobility: Modified Independent                Transfers Overall transfer level: Modified independent Equipment used: None Transfers: Sit to/from Stand Sit to Stand: Modified independent (Device/Increase time)              Balance Overall balance assessment: Needs assistance Sitting-balance support: No upper extremity supported;Feet supported Sitting balance-Leahy Scale: Normal     Standing balance support: No upper extremity supported;During functional activity Standing balance-Leahy Scale: Fair                              ADL Overall ADL's : Modified independent                                       General ADL Comments: Pt educated in energy conservation strategies and in availability and benefits of AE for LB bathing and dressing. Pt is not interested in AE.     Vision     Perception     Praxis      Pertinent Vitals/Pain Pain Assessment: No/denies pain     Hand Dominance Right   Extremity/Trunk Assessment Upper Extremity Assessment Upper Extremity Assessment: Overall WFL for tasks  assessed   Lower Extremity Assessment Lower Extremity Assessment: Defer to PT evaluation       Communication Communication Communication: No difficulties   Cognition Arousal/Alertness: Awake/alert Behavior During Therapy: WFL for tasks assessed/performed Overall Cognitive Status: Within Functional Limits for tasks assessed                     General Comments       Exercises       Shoulder Instructions      Home Living Family/patient expects to be discharged to:: Private residence Living Arrangements: Alone Available Help at Discharge: Family;Available PRN/intermittently Type of Home: House Home Access: Level entry     Home Layout: Two level;Laundry or work area in basement     [employer] Shower/Tub: Teacher, early years/pre: Standard     Home Equipment: Civil engineer, contracting          Prior Functioning/Environment Level of Independence: Engineer, materials / Transfers Assistance Needed: walks independently ADL's / Homemaking Assistance Needed: children assist with errands and  housekeeping, pt avoids socks and wears slip on shoes            OT Problem List: Decreased activity tolerance   OT Treatment/Interventions:      OT Goals(Current  goals can be found in the care plan section) Acute Rehab OT Goals Patient Stated Goal: return home  OT Frequency:     Barriers to D/C:            Co-evaluation              End of Session    Activity Tolerance: Patient tolerated treatment well Patient left: in bed;with call bell/phone within reach   Time: [id number] OT Time Calculation (min): 23 min Charges:  OT General Charges $OT Visit: 1 Procedure OT Evaluation $OT Eval Low Complexity: 1 Procedure OT Treatments $Self Care/Home Management : 8-22 mins G-Codes:    Malka So 01/12/2017, 3:36 PM  [phone]

## 2017-01-12 NOTE — Progress Notes (Signed)
Patient refused bed alarm. Will continue to monitor patient. 

## 2017-01-12 NOTE — Progress Notes (Signed)
Pt arrived to floor via w/c form 4N.  Oriented room.  Call bell at reach.  Instructed her to call for assistance as needed.  Also refused to have bed alarm turned on.  Instructed her to call . Verbalized understanding.  Will continue to monitor.  Karie Kirks, Therapist, sports.

## 2017-01-12 NOTE — Progress Notes (Signed)
Physical Therapy Treatment Patient Details Name: Pamela Soto MRN: [id number] DOB: [birth year] Today's Date: 01/15/2017    History of Present Illness Pt with respiratory failure and cardiomyopathy/acute CHF. PMH - copd,     PT Comments    Pt doing very well with mobility. From PT standpoint pt can return home when medically ready.  Follow Up Recommendations  No PT follow up;Supervision - Intermittent     Equipment Recommendations  None recommended by PT    Recommendations for Other Services       Precautions / Restrictions Precautions Precautions: None Restrictions Weight Bearing Restrictions: No    Mobility  Bed Mobility Overal bed mobility: Modified Independent                Transfers Overall transfer level: Modified independent Equipment used: None Transfers: Sit to/from Stand Sit to Stand: Modified independent (Device/Increase time)            Ambulation/Gait Ambulation/Gait assistance: Supervision Ambulation Distance (Feet): 200 Feet Assistive device: None Gait Pattern/deviations: Step-through pattern;Decreased stride length;Drifts right/left Gait velocity: decr Gait velocity interpretation: Below normal speed for age/gender General Gait Details: Pt with improved stability with gait. Amb on RA with SpO2>90%   Stairs            Wheelchair Mobility    Modified Rankin (Stroke Patients Only)       Balance Overall balance assessment: Needs assistance Sitting-balance support: No upper extremity supported;Feet supported Sitting balance-Leahy Scale: Normal     Standing balance support: No upper extremity supported;During functional activity Standing balance-Leahy Scale: Poor                      Cognition Arousal/Alertness: Awake/alert Behavior During Therapy: WFL for tasks assessed/performed Overall Cognitive Status: Within Functional Limits for tasks assessed                      Exercises      General Comments         Pertinent Vitals/Pain Pain Assessment: No/denies pain    Home Living                      Prior Function            PT Goals (current goals can now be found in the care plan section) Progress towards PT goals: Progressing toward goals    Frequency    Min 3X/week      PT Plan Current plan remains appropriate    Co-evaluation             End of Session   Activity Tolerance: Patient tolerated treatment well Patient left: in chair;with call bell/phone within reach     Time: 1152-1203 PT Time Calculation (min) (ACUTE ONLY): 11 min  Charges:  $Gait Training: 8-22 mins                    G CodesShary Decamp [facility] January 15, 2017, 12:50 PM [employer] PT [phone]

## 2017-01-12 NOTE — Progress Notes (Addendum)
Subjective:  Patient reports having chest pressure and diaphoresis overnight. Denies having any associated SOB. Denies having any palpitations. States her cough has improved.   Objective:  Vitals:   01/11/17 1917 01/11/17 2032 01/11/17 2322 01/12/17 0411  BP:  128/79 99/78 125/68  Pulse:  64 (!) 57 68  Resp:  17 19 19   Temp:  98.3 F (36.8 C) 98.2 F (36.8 C) 98.2 F (36.8 C)  TempSrc:  Oral Oral Oral  SpO2: 95% 96% 99% 96%  Weight:      Height:        Intake/Output from previous day:  Intake/Output Summary (Last 24 hours) at 01/12/17 [id number] Last data filed at 01/12/17 0400  Gross per 24 hour  Intake          1348.57 ml  Output             1300 ml  Net            48.57 ml    Physical Exam: Obese female Heart:  S1/S2. No murmur, no rub, gallop or click. No pedal edema.  Lungs: CTAB. No wheezes, rales, or rhonchi.   Abdomen: soft, +BS positve, no distention, no tenderness Neuro: non-focal Skin: warm and dry MSK: No muscular weakness   Lab Results: Basic Metabolic Panel:  Recent Labs  01/11/17 0234 01/12/17 0233  NA 141 139  K 4.9 4.5  CL 95* 93*  CO2 36* 36*  GLUCOSE 95 115*  BUN 20 20  CREATININE 1.07* 1.08*  CALCIUM 8.9 8.7*  MG 2.1 2.1   CBC:  Recent Labs  01/11/17 0234 01/12/17 0233  WBC 16.1* 16.9*  HGB 14.9 13.9  HCT 47.3* 43.8  MCV 96.3 94.8  PLT 236 249   Cardiac Studies:  ECG:  Orders placed or performed during the hospital encounter of 01/04/17  . EKG 12-Lead  . EKG 12-Lead  . EKG  . EKG 12-Lead  . EKG 12-Lead  . EKG 12-Lead  . EKG 12-Lead  . EKG 12-Lead  . EKG 12-Lead    Telemetry:  Sinus Bradycardia - personally reviewed 01/12/2017  Echo:  EF 35-40%  Mild MR   R/ LHC 01/10/17:  Dist LAD lesion, 100 %stenosed.  Prox RCA lesion, 40 %stenosed.  3rd Mrg lesion, 40 %stenosed.  There is severe left ventricular systolic dysfunction.  The left ventricular ejection fraction is less than 25% by visual estimate.  LV end  diastolic pressure is severely elevated.  Hemodynamic findings consistent with moderate pulmonary hypertension.    Acute atrial fibrillation developed on the cath table during right heart/right atrial pressure recording. Rate 118 bpm. Atrial fibrillation was treated with one bolus of IV amiodarone.  40% proximal RCA stenosis.  Tortuous but normal circumflex  100% distal/apical LAD stenosis. This lesion is probably chronic but difficult to determine age. There is also a possibility that it is acute embolic occlusion from a catheter-based thrombus. For that reason additional heparin and then Aggrastat bolus was given as the patient complained of chest pressure post procedure.  Chest pressure post procedure is similar to prior episodes.  Severe LV dysfunction with EF less than 25%. Contraction pattern appears global.  Arterial blood gas demonstrated severe CO2 retention, pCO2 67.  Medications:   . apixaban  5 mg Oral BID  . aspirin  81 mg Oral Daily  . atorvastatin  80 mg Oral q1800  . bisoprolol  5 mg Oral Daily  . budesonide (PULMICORT) nebulizer solution  0.5 mg Nebulization  BID  . furosemide  40 mg Oral Daily  . ipratropium-albuterol  3 mL Nebulization TID  . lisinopril  5 mg Oral Daily  . potassium chloride  20 mEq Oral Daily  . predniSONE  50 mg Oral Q breakfast  . sodium chloride flush  3 mL Intravenous Q12H  . sodium chloride flush  3 mL Intravenous Q12H       Assessment/Plan:   NICM: Likely non-ischemic in etiology. R/ LHC showing 100% distal LAD stenosis, 40% proximal RCA stenosis, 40% 3rd marginal stenosis, severe LV systolic dysfunction with EF <25%, severely elevated LV EDP, and moderate pulmonary HTN. Doses of BB and ACEI were increased yesterday as her BP continues to be elevated. However, patient noted to have sinus bradycardia on telemetry. Patient received a dose of IV Lasix yesterday. Net -338 cc. Her cough has improved and she appears euvolemic on exam. Will  decrease dose of BB today and increase dose of ACEI to control her BP. Will continue diuresis with oral Lasix today. Medical mgmt for CAD as no good options for PCI.  -Oral Lasix 40 mg daily  -Decrease Bisoprolol to 2.5 mg daily -Increase Lisinopril to 10 mg daily   Acute Afib:  Patient developed acute Afib during the procedure and was given a bolus of IV Amiodarone. Troponins trended down - 0.04> 0.47> 2.24> 1.4> 1.19. Telemetry showing sinus bradycardia now. -Checking EKG this AM as patient complained of chest pressure and diaphoresis overnight.  -Will check troponin -Discontinue Aspirin -Start Plavix for antiplatelet therapy as she has 100% stenosis for her distal LAD. Giving Plavix 600 mg today (loading dose) and then Plavix 75 mg daily starting tomorrow.  -Bisoprolol 2.5 mg daily  -Discontinue IV heparin -Start Eliquis 5 mg BID for long-term anticougulation  HLD: Continue Lipitor 80 mg daily   COPD: Cough has improved. No wheezing on exam. Continue atrovent and pulmicort.  Will transfer to telemetry today.   Shela Leff, MD PGY2 - IMTS Pager [phone]  I have personally seen and examined this patient with Dr. Marlowe Sax. I agree with the assessment and plan as outlined above. She is stable this am. Mild chest pain last night. Given elevated troponin, suggests distal LAD lesion could have been acute. Cannot determine this for sure. Unable to determine if this was a catheter based thrombus. Will treat as if this was a coronary thrombus. Will add PLavix today and stop ASA. Lower beta blocker with bradycardia. Start Eliquis. Transfer to tele.    Lauree Chandler 01/12/2017 8:55 AM

## 2017-01-12 NOTE — Progress Notes (Signed)
Pt refused to wear SCD's States "I will put them on when ready to go to bed tonight.".  Instructed pt to call for assistance.  Verbalized understanding.  Karie Kirks, Therapist, sports.

## 2017-01-12 NOTE — Progress Notes (Signed)
[facility] TEAM 1 - Stepdown/ICU TEAM  Pamela Soto  [id number] DOB: 06/01/1951 DOA: 01/04/2017 PCP: No PCP Per Patient    Brief Narrative:  66 year old female with a history of COPD, GERD, and morbid obesity who presented with a 2 year history of shortness breath that has worsened over 2weeksprior to admission. The patient was noted to be wheezing and hypoxemic in the emergency department with saturation in the upper 80s. She was given bronchodilators and IV steroids with some improvement in the ED, but the patient remained dyspneic on exertion. The patient denied any worsening lower extremity edema, but complained of increasing abdominal girth.   The patient was treated for COPD exacerbation, but initial history and exam suggested respiratory failure from a degree of fluid overload as well as multifactorial etiologies. The patient was treated with intravenous furosemide with good clinical effect. Cardiology was consulted for newly diagnosed cardiomyopathy with EF 35-40 percent. The patient was transferred to [facility] on 01/10/2017 for heart catheterization.  Subjective: The patient is resting comfortably in bed.  She denies current shortness of breath chest pain chest pressure nausea vomiting or abdominal pain.  She does report a brief episode of chest discomfort last night while moving around in bed.  This was a self-limited event lasting no more than a few minutes.  Assessment & Plan:  Acute respiratory failure with hypoxia COPD exacerbation + OSA/OHS + CHF + deconditioning - wean to room air as able - significantly improved at this time  CAD Medical management per Cards as no option for intervention - stopping ASA in favor of Plavix   Transient Afib developed acute Afib during cath and was given a bolus of IV Amiodarone - Eliquis initiated per Cards  COPD exacerbation Appears to have resolved with no wheezing at this time - continue maintenance therapy  Acute Systolic CHF TTE  noted EF 35-40 percent, diffuse HK, mild MR - diuretic use ongoing - ACE inhibitor and beta blocker being administered - volume status difficult to judge on physical exam due to morbid obesity - follow daily weights and Is/Os - net negative approximately 1.2 L thus far  [facility] Weights   01/04/17 1613 01/10/17 0457 01/10/17 1712  Weight: 115.5 kg (254 lb 11.2 oz) 116.1 kg (255 lb 15.3 oz) 111.5 kg (245 lb 13 oz)    HLD Cont lipitor at 80mg   Morbid obesity - Body mass index is 40.91 kg/m. Begin PT/OT   Impaired glucose Tolerance A1C 6.1   DVT prophylaxis: lovenox  Code Status: FULL CODE Family Communication: no family present at time of exam  Disposition Plan: transfer to tele bed - PT/OT - cont to diurese   Consultants:  [facility]   Procedures: 2/5 cardiac cath:  40% proximal RCA stenosis - Tortuous but normal circumflex - 100% distal/apical LAD stenosis - Severe LV dysfunction with EF less than 25%.   Antimicrobials:  Azithromycin 1/30 > 2/3  Objective: Blood pressure 94/81, pulse 66, temperature 98.2 F (36.8 C), temperature source Oral, resp. rate 16, height 5\' 5"  (1.651 m), weight 111.5 kg (245 lb 13 oz), SpO2 95 %.  Intake/Output Summary (Last 24 hours) at 01/12/17 0958 Last data filed at 01/12/17 0400  Gross per 24 hour  Intake           1189.7 ml  Output             1300 ml  Net           -110.3 ml   Danley Danker  Weights   01/04/17 1613 01/10/17 0457 01/10/17 1712  Weight: 115.5 kg (254 lb 11.2 oz) 116.1 kg (255 lb 15.3 oz) 111.5 kg (245 lb 13 oz)    Examination: General: No acute respiratory distress at rest in bed  Lungs: Distant breath sounds throughout all fields with no active wheeze Cardiovascular: Regular rate and rhythm - very distant heart sounds Abdomen: Morbidly obese, soft, bowel sounds positive, no rebound Extremities: 1+ bilateral lower extremity edema without cyanosis  CBC:  Recent Labs Lab 01/10/17 0457 01/11/17 0234 01/12/17 0233    WBC 19.3* 16.1* 16.9*  HGB 14.9 14.9 13.9  HCT 46.6* 47.3* 43.8  MCV 96.3 96.3 94.8  PLT 254 236 0000000   Basic Metabolic Panel:  Recent Labs Lab 01/06/17 0603 01/07/17 0607 01/08/17 0602 01/09/17 0604 01/10/17 0457 01/11/17 0234 01/12/17 0233  NA 141 140 140 141 137 141 139  K 4.5 3.9 3.2* 4.0 4.3 4.9 4.5  CL 97* 96* 92* 95* 96* 95* 93*  CO2 38* 38* 40* 39* 32 36* 36*  GLUCOSE 104* 92 88 108* 90 95 115*  BUN 20 18 20  24* 25* 20 20  CREATININE 1.02* 0.80 0.98 0.90 0.93 1.07* 1.08*  CALCIUM 8.3* 8.1* 8.3* 8.4* 8.4* 8.9 8.7*  MG 2.2 1.9 2.0  --   --  2.1 2.1   GFR: Estimated Creatinine Clearance: 64.6 mL/min (by C-G formula based on SCr of 1.08 mg/dL (H)).  Liver Function Tests:  Recent Labs Lab 01/09/17 0604  AST 17  ALT 25  ALKPHOS 39  BILITOT 1.2  PROT 6.7  ALBUMIN 3.5    Coagulation Profile:  Recent Labs Lab 01/10/17 0457  INR 0.96    Cardiac Enzymes:  Recent Labs Lab 01/10/17 2217 01/11/17 0841 01/11/17 1348 01/11/17 2040 01/12/17 0851  TROPONINI 0.47* 2.24* 1.40* 1.19* 1.33*    HbA1C: Hgb A1c MFr Bld  Date/Time Value Ref Range Status  01/05/2017 04:35 AM 6.1 (H) 4.8 - 5.6 % Final    Comment:    (NOTE)         Pre-diabetes: 5.7 - 6.4         Diabetes: >6.4         Glycemic control for adults with diabetes: <7.0   05/30/2008 02:44 PM   Final   6.0 (NOTE)   The ADA recommends the following therapeutic goal for glycemic   control related to Hgb A1C measurement:   Goal of Therapy:   < 7.0% Hgb A1C   Reference: American Diabetes Association: Clinical Practice   Recommendations 2008, Diabetes Care,  2008, 31:(Suppl 1).    CBG: No results for input(s): GLUCAP in the last 168 hours.  Recent Results (from the past 240 hour(s))  MRSA PCR Screening     Status: None   Collection Time: 01/10/17  4:52 PM  Result Value Ref Range Status   MRSA by PCR NEGATIVE NEGATIVE Final    Comment:        The GeneXpert MRSA Assay (FDA approved for NASAL  specimens only), is one component of a comprehensive MRSA colonization surveillance program. It is not intended to diagnose MRSA infection nor to guide or monitor treatment for MRSA infections.      Scheduled Meds: . apixaban  5 mg Oral BID  . atorvastatin  80 mg Oral q1800  . bisoprolol  2.5 mg Oral Daily  . budesonide (PULMICORT) nebulizer solution  0.5 mg Nebulization BID  . [START ON 01/13/2017] clopidogrel  75 mg Oral Daily  .  furosemide  40 mg Oral Daily  . ipratropium-albuterol  3 mL Nebulization TID  . lisinopril  10 mg Oral Daily  . potassium chloride  20 mEq Oral Daily  . predniSONE  50 mg Oral Q breakfast  . sodium chloride flush  3 mL Intravenous Q12H  . sodium chloride flush  3 mL Intravenous Q12H     LOS: 7 days   Cherene Altes, MD [facility] Office  [phone] Pager - Text Page per Shea Evans as per below:  On-Call/Text Page:      [email or web address]      password [email or web address]  If 7PM-7AM, please contact night-coverage [email or web address] Password [email or web address] 01/12/2017, 9:58 AM

## 2017-01-12 NOTE — Discharge Instructions (Addendum)
Follow with Primary MD in 7 days   Get CBC, CMP, 2 view Chest X ray checked  by Primary MD or SNF MD in 5-7 days ( we routinely change or add medications that can affect your baseline labs and fluid status, therefore we recommend that you get the mentioned basic workup next visit with your PCP, your PCP may decide not to get them or add new tests based on their clinical decision)  Activity: As tolerated with Full fall precautions use walker/cane & assistance as needed  Disposition Home    Diet: Heart Healthy   Check your Weight same time everyday, if you gain over 2 pounds, or you develop in leg swelling, experience more shortness of breath or chest pain, call your Primary MD immediately. Follow Cardiac Low Salt Diet and 1.5 lit/day fluid restriction.  On your next visit with your primary care physician please Get Medicines reviewed and adjusted.  Please request your Prim.MD to go over all Hospital Tests and Procedure/Radiological results at the follow up, please get all Hospital records sent to your Prim MD by signing hospital release before you go home.  If you experience worsening of your admission symptoms, develop shortness of breath, life threatening emergency, suicidal or homicidal thoughts you must seek medical attention immediately by calling 911 or calling your MD immediately  if symptoms less severe.  You Must read complete instructions/literature along with all the possible adverse reactions/side effects for all the Medicines you take and that have been prescribed to you. Take any new Medicines after you have completely understood and accpet all the possible adverse reactions/side effects.   Do not drive, operate heavy machinery, perform activities at heights, swimming or participation in water activities or provide baby sitting services if your were admitted for syncope or siezures until you have seen by Primary MD or a Neurologist and advised to do so again.  Do not drive when  taking Pain medications.    Do not take more than prescribed Pain, Sleep and Anxiety Medications  Special Instructions: If you have smoked or chewed Tobacco  in the last 2 yrs please stop smoking, stop any regular Alcohol  and or any Recreational drug use.  Wear Seat belts while driving.   Please note  You were cared for by a hospitalist during your hospital stay. If you have any questions about your discharge medications or the care you received while you were in the hospital after you are discharged, you can call the unit and asked to speak with the hospitalist on call if the hospitalist that took care of you is not available. Once you are discharged, your primary care physician will handle any further medical issues. Please note that NO REFILLS for any discharge medications will be authorized once you are discharged, as it is imperative that you return to your primary care physician (or establish a relationship with a primary care physician if you do not have one) for your aftercare needs so that they can reassess your need for medications and monitor your lab values.     Information on my medicine - ELIQUIS (apixaban)  This medication education was reviewed with me or my healthcare representative as part of my discharge preparation.  The pharmacist that spoke with me during my hospital stay was:  Dareen Piano, [facility]  Why was Eliquis prescribed for you? Eliquis was prescribed for you to reduce the risk of a blood clot forming that can cause a stroke if you have a medical  condition called atrial fibrillation (a type of irregular heartbeat).  What do You need to know about Eliquis ? Take your Eliquis TWICE DAILY - one tablet in the morning and one tablet in the evening with or without food. If you have difficulty swallowing the tablet whole please discuss with your pharmacist how to take the medication safely.  Take Eliquis exactly as prescribed by your doctor and DO NOT stop  taking Eliquis without talking to the doctor who prescribed the medication.  Stopping may increase your risk of developing a stroke.  Refill your prescription before you run out.  After discharge, you should have regular check-up appointments with your healthcare provider that is prescribing your Eliquis.  In the future your dose may need to be changed if your kidney function or weight changes by a significant amount or as you get older.  What do you do if you miss a dose? If you miss a dose, take it as soon as you remember on the same day and resume taking twice daily.  Do not take more than one dose of ELIQUIS at the same time to make up a missed dose.  Important Safety Information A possible side effect of Eliquis is bleeding. You should call your healthcare provider right away if you experience any of the following: ? Bleeding from an injury or your nose that does not stop. ? Unusual colored urine (red or dark brown) or unusual colored stools (red or black). ? Unusual bruising for unknown reasons. ? A serious fall or if you hit your head (even if there is no bleeding).  Some medicines may interact with Eliquis and might increase your risk of bleeding or clotting while on Eliquis. To help avoid this, consult your healthcare provider or pharmacist prior to using any new prescription or non-prescription medications, including herbals, vitamins, non-steroidal anti-inflammatory drugs (NSAIDs) and supplements.  This website has more information on Eliquis (apixaban): http://www.eliquis.com/eliquis/home

## 2017-01-12 NOTE — Progress Notes (Signed)
ANTICOAGULATION CONSULT NOTE - Follow Up Consult  Pharmacy Consult for Heparin Indication: atrial fibrillation, coronary embolism  No Known Allergies  Patient Measurements: Height: 5\' 5"  (165.1 cm) Weight: 245 lb 13 oz (111.5 kg) IBW/kg (Calculated) : 57 Heparin Dosing Weight: ~ 84 kg  Vital Signs: Temp: 98.2 F (36.8 C) (02/07 0411) Temp Source: Oral (02/07 0411) BP: 125/68 (02/07 0411) Pulse Rate: 68 (02/07 0411)  Labs:  Recent Labs  01/10/17 0457  01/11/17 0234 01/11/17 0841 01/11/17 1348 01/11/17 2040 01/12/17 0233  HGB 14.9  --  14.9  --   --   --  13.9  HCT 46.6*  --  47.3*  --   --   --  43.8  PLT 254  --  236  --   --   --  249  LABPROT 12.8  --   --   --   --   --   --   INR 0.96  --   --   --   --   --   --   HEPARINUNFRC  --   < > <0.10*  --  <0.10* 0.77* 1.04*  CREATININE 0.93  --  1.07*  --   --   --  1.08*  TROPONINI  --   < >  --  2.24* 1.40* 1.19*  --   < > = values in this interval not displayed.  Estimated Creatinine Clearance: 64.6 mL/min (by C-G formula based on SCr of 1.08 mg/dL (H)).   Assessment: 56 yof s/p cath 2/5 continues on heparin for afib and acute coronary embolism. Heparin level continues to be supratherapeutic and trending up. No bleeding noted. Hgb down a bit.  Goal of Therapy:  Heparin level 0.3-0.7 units/ml Monitor platelets by anticoagulation protocol: Yes   Plan:  Hold heparin x 1 hours Decrease heparin to 1550 units/hr Heparin level in 6 hours  Sherlon Handing, PharmD, BCPS Clinical pharmacist, pager [phone] 01/12/2017 4:55 AM

## 2017-01-12 NOTE — Progress Notes (Signed)
Pt's 02 93 % on RA.  Denies any S0B.  Instructed pt to call if she feels like she is SOB>  Verbalized understanding.  Karie Kirks, Therapist, sports.

## 2017-01-12 NOTE — Care Management Note (Signed)
Case Management Note  Patient Details  Name: Pamela Soto MRN: [id number] Date of Birth: 11/05/1951  Subjective/Objective:     Adm w chf               Action/Plan:lives at home   Expected Discharge Date:                  Expected Discharge Plan:  [place]  In-House Referral:     Discharge planning Services  CM Consult, Medication Assistance  Post Acute Care Choice:  NA Choice offered to:  NA  DME Arranged:    DME Agency:     HH Arranged:    HH Agency:     Status of Service:  In process, will continue to follow  If discussed at Long Length of Stay Meetings, dates discussed:    Additional Comments: gave pt 30day free eliquis card. fam in room states pt has medicare d plan but haven't gotten ins card yet. Cont to follow.  Lacretia Leigh, RN 01/12/2017, 10:41 AM

## 2017-01-13 LAB — CBC
HCT: 43.8 % (ref 36.0–46.0)
Hemoglobin: 14 g/dL (ref 12.0–15.0)
MCH: 29.8 pg (ref 26.0–34.0)
MCHC: 32 g/dL (ref 30.0–36.0)
MCV: 93.2 fL (ref 78.0–100.0)
PLATELETS: 236 10*3/uL (ref 150–400)
RBC: 4.7 MIL/uL (ref 3.87–5.11)
RDW: 13.4 % (ref 11.5–15.5)
WBC: 14.2 10*3/uL — ABNORMAL HIGH (ref 4.0–10.5)

## 2017-01-13 LAB — BASIC METABOLIC PANEL
Anion gap: 10 (ref 5–15)
BUN: 17 mg/dL (ref 6–20)
CALCIUM: 8.7 mg/dL — AB (ref 8.9–10.3)
CO2: 34 mmol/L — ABNORMAL HIGH (ref 22–32)
CREATININE: 0.92 mg/dL (ref 0.44–1.00)
Chloride: 96 mmol/L — ABNORMAL LOW (ref 101–111)
GFR calc non Af Amer: >60 mL/min (ref >60)
Glucose, Bld: 89 mg/dL (ref 65–99)
Potassium: 4.1 mmol/L (ref 3.5–5.1)
SODIUM: 140 mmol/L (ref 135–145)

## 2017-01-13 LAB — TROPONIN I: TROPONIN I: 1.2 ng/mL — AB (ref <0.03)

## 2017-01-13 MED ORDER — PREDNISONE 10 MG PO TABS: 10.0000 mg | ORAL_TABLET | Freq: Every day | ORAL | Status: DC

## 2017-01-13 NOTE — Care Management Note (Signed)
Case Management Note  Patient Details  Name: Pamela Soto MRN: [id number] Date of Birth: 11/30/1951  Subjective/Objective:     Admitted with CHF               Action/Plan: Patient lives alone, independent of all of her ADL's; no PCP; has private insurance with Medicare / Medicaid with prescription drug coverage; Patient stated that she wanted to see a PCP in [place] where she lives; Apt made with Wilfred Lacy NP with [facility] for March 15,2018 at 11 am; Pt is aware of appointment; No DME needed at this time; CM will continue to follow for DCP  Expected Discharge Date:    possibly 01/14/2017              Expected Discharge Plan:  Home/Self Care   Discharge planning Services  CM Consult, Follow-up appt scheduled  Status of Service:  In process, will continue to follow  Sherrilyn Rist [id number] 01/13/2017, 3:31 PM

## 2017-01-13 NOTE — Progress Notes (Signed)
[place] TEAM 1 - Stepdown/ICU TEAM  Pamela Soto  [id number] DOB: September 25, 1951 DOA: 01/04/2017 PCP: No PCP Per Patient    Brief Narrative:  66 year old female with a history of COPD, GERD, and morbid obesity who presented with a 2 year history of shortness breath that has worsened over 2weeksprior to admission. The patient was noted to be wheezing and hypoxemic in the emergency department with saturation in the upper 80s. She was given bronchodilators and IV steroids with some improvement in the ED, but the patient remained dyspneic on exertion. The patient denied any worsening lower extremity edema, but complained of increasing abdominal girth.   The patient was treated for COPD exacerbation, but initial history and exam suggested respiratory failure from a degree of fluid overload as well as multifactorial etiologies. The patient was treated with intravenous furosemide with good clinical effect. Cardiology was consulted for newly diagnosed cardiomyopathy with EF 35-40 percent. The patient was transferred to [facility] on 01/10/2017 for heart catheterization.  Subjective:  The patient is resting comfortably in bed.  She denies any headache, no fever or chills, no chest pain, really no shortness of breath and feels better.   Assessment & Plan:  Acute respiratory failure with hypoxia COPD exacerbation + OSA/OHS + CHF + deconditioning - wean to room air as able - significantly improved at this time  CAD She underwent left heart catheterization as below, Medical management per Cards as no option for intervention - currently on combination of Plavix, statin, beta blocker and Eliquis for secondary prevention. Follow with PCP for secondary risk factor modulation along with cardiology after discharge.   Transient Afib developed acute Afib during cath and was given a bolus of IV Amiodarone - Eliquis initiated per Cards  COPD exacerbation Appears to have resolved with no wheezing at this time -  continue maintenance therapy  Acute on chronic combined systolic and diastolic CHF EF [id number]. Due to ischemic cardiomyopathy TTE noted EF 35-40 percent, diffuse HK, mild MR - diuretic use ongoing - ACE inhibitor and beta blocker being administered - volume status close to compensated now - follow daily weights and Is/Os - net negative approximately 1.8 L thus far  [facility] Weights   01/10/17 1712 01/12/17 1500 01/13/17 0614  Weight: 111.5 kg (245 lb 13 oz) 113.5 kg (250 lb 3.2 oz) 112.9 kg (249 lb)    HLD Cont lipitor at 80mg   Morbid obesity - Body mass index is 41.44 kg/m. Begin PT/OT Will follow with PCP for weight loss  Impaired glucose Tolerance A1C 6.1, Prediabetic follow with PCP.  Leukocytosis. Afebrile, trend improving, noted to be on steroids for reason unknown, will taper of steroids and monitor.   DVT prophylaxis: Eliquis Code Status: FULL CODE Family Communication: no family present at time of exam  Disposition Plan: Home in a.m.   Consultants:  [facility]   Procedures:  2/1 TTE - Left ventricle: The cavity size was mildly dilated. Wall thickness was increased in a pattern of moderate LVH. Systolic function was moderately reduced. The estimated ejection fraction was in the range of 35% to 40%. Diffuse hypokinesis. - Aortic valve: Mildly calcified annulus. Trileaflet; mildly thickened leaflets. - Mitral valve: There was mild regurgitation. - Left atrium: The atrium was moderately dilated. - Atrial septum: The septum bowed from left to right, consistent with increased left atrial pressure. - Pulmonic valve: There was mild regurgitation.  2/5 cardiac cath:  40% proximal RCA stenosis - Tortuous but normal circumflex - 100% distal/apical LAD stenosis -  Severe LV dysfunction with EF less than 25%.   Antimicrobials:  Azithromycin 1/30 > 2/3  Objective: Blood pressure (!) 112/47, pulse 75, temperature 98.2 F (36.8 C), temperature source Oral, resp. rate 18,  height 5\' 5"  (1.651 m), weight 112.9 kg (249 lb), SpO2 96 %.  Intake/Output Summary (Last 24 hours) at 01/13/17 1116 Last data filed at 01/13/17 0953  Gross per 24 hour  Intake              600 ml  Output             1275 ml  Net             -675 ml   Filed Weights   01/10/17 1712 01/12/17 1500 01/13/17 0614  Weight: 111.5 kg (245 lb 13 oz) 113.5 kg (250 lb 3.2 oz) 112.9 kg (249 lb)    Examination: General: No acute respiratory distress at rest in bed  Lungs: Distant breath sounds throughout all fields with no active wheeze Cardiovascular: Regular rate and rhythm - very distant heart sounds Abdomen: Morbidly obese, soft, bowel sounds positive, no rebound Extremities: 1+ bilateral lower extremity edema without cyanosis  CBC:  Recent Labs Lab 01/10/17 0457 01/11/17 0234 01/12/17 0233 01/13/17 0556  WBC 19.3* 16.1* 16.9* 14.2*  HGB 14.9 14.9 13.9 14.0  HCT 46.6* 47.3* 43.8 43.8  MCV 96.3 96.3 94.8 93.2  PLT 254 236 249 [id number]   Basic Metabolic Panel:  Recent Labs Lab 01/07/17 0607 01/08/17 0602 01/09/17 0604 01/10/17 0457 01/11/17 0234 01/12/17 0233 01/13/17 0556  NA 140 140 141 137 141 139 140  K 3.9 3.2* 4.0 4.3 4.9 4.5 4.1  CL 96* 92* 95* 96* 95* 93* 96*  CO2 38* 40* 39* 32 36* 36* 34*  GLUCOSE 92 88 108* 90 95 115* 89  BUN 18 20 24* 25* 20 20 17   CREATININE 0.80 0.98 0.90 0.93 1.07* 1.08* 0.92  CALCIUM 8.1* 8.3* 8.4* 8.4* 8.9 8.7* 8.7*  MG 1.9 2.0  --   --  2.1 2.1  --    GFR: Estimated Creatinine Clearance: 76.4 mL/min (by C-G formula based on SCr of 0.92 mg/dL).  Liver Function Tests:  Recent Labs Lab 01/09/17 0604  AST 17  ALT 25  ALKPHOS 39  BILITOT 1.2  PROT 6.7  ALBUMIN 3.5    Coagulation Profile:  Recent Labs Lab 01/10/17 0457  INR 0.96    Cardiac Enzymes:  Recent Labs Lab 01/11/17 0841 01/11/17 1348 01/11/17 2040 01/12/17 0851 01/13/17 0556  TROPONINI 2.24* 1.40* 1.19* 1.33* 1.20*    HbA1C: Hgb A1c MFr Bld  Date/Time  Value Ref Range Status  01/05/2017 04:35 AM 6.1 (H) 4.8 - 5.6 % Final    Comment:    (NOTE)         Pre-diabetes: 5.7 - 6.4         Diabetes: >6.4         Glycemic control for adults with diabetes: <7.0   05/30/2008 02:44 PM   Final   6.0 (NOTE)   The ADA recommends the following therapeutic goal for glycemic   control related to Hgb A1C measurement:   Goal of Therapy:   < 7.0% Hgb A1C   Reference: American Diabetes Association: Clinical Practice   Recommendations 2008, Diabetes Care,  2008, 31:(Suppl 1).    CBG: No results for input(s): GLUCAP in the last 168 hours.  Recent Results (from the past 240 hour(s))  MRSA PCR Screening  Status: None   Collection Time: 01/10/17  4:52 PM  Result Value Ref Range Status   MRSA by PCR NEGATIVE NEGATIVE Final    Comment:        The GeneXpert MRSA Assay (FDA approved for NASAL specimens only), is one component of a comprehensive MRSA colonization surveillance program. It is not intended to diagnose MRSA infection nor to guide or monitor treatment for MRSA infections.      Scheduled Meds: . apixaban  5 mg Oral BID  . atorvastatin  80 mg Oral q1800  . bisoprolol  2.5 mg Oral Daily  . budesonide (PULMICORT) nebulizer solution  0.5 mg Nebulization BID  . clopidogrel  75 mg Oral Daily  . furosemide  40 mg Oral Daily  . lisinopril  10 mg Oral Daily  . potassium chloride  20 mEq Oral Daily  . predniSONE  20 mg Oral Q breakfast     LOS: 8 days   Signature  Lala Lund K M.D on 01/13/2017 at 11:16 AM  Between 7am to 7pm - Pager - [phone], After 7pm go to [email or web address] - password [facility]  [facility]  - Office  [phone]

## 2017-01-13 NOTE — Progress Notes (Signed)
Refused bed alarm. Will continue to monitor patient. 

## 2017-01-13 NOTE — Progress Notes (Addendum)
Subjective:  Patient denies having any CP or palpitations. States her breathing has improved.   Objective:  Vitals:   01/12/17 2129 01/12/17 2133 01/12/17 2340 01/13/17 0614  BP:   (!) 149/60 (!) 115/56  Pulse:   83 75  Resp:   20 18  Temp:   98.1 F (36.7 C) 98.2 F (36.8 C)  TempSrc:   Oral Oral  SpO2: 97% 98% 96% 98%  Weight:    249 lb (112.9 kg)  Height:        Intake/Output from previous day:  Intake/Output Summary (Last 24 hours) at 01/13/17 0800 Last data filed at 01/13/17 [id number]  Gross per 24 hour  Intake              480 ml  Output             1275 ml  Net             -795 ml    Physical Exam: Obese female Heart:  S1/S2. No murmur, no rub, gallop or click. No pedal edema.  Lungs: Mild end expiratory wheezing. No rales or rhonchi.   Abdomen: soft, +BS positve, no distention, no tenderness Neuro: non-focal Skin: warm and dry MSK: No muscular weakness   Lab Results: Basic Metabolic Panel:  Recent Labs  01/11/17 0234 01/12/17 0233 01/13/17 0556  NA 141 139 140  K 4.9 4.5 4.1  CL 95* 93* 96*  CO2 36* 36* 34*  GLUCOSE 95 115* 89  BUN 20 20 17   CREATININE 1.07* 1.08* 0.92  CALCIUM 8.9 8.7* 8.7*  MG 2.1 2.1  --    CBC:  Recent Labs  01/12/17 0233 01/13/17 0556  WBC 16.9* 14.2*  HGB 13.9 14.0  HCT 43.8 43.8  MCV 94.8 93.2  PLT 249 236   Cardiac Studies:  ECG:  Orders placed or performed during the hospital encounter of 01/04/17  . EKG 12-Lead  . EKG 12-Lead  . EKG  . EKG 12-Lead  . EKG 12-Lead  . EKG 12-Lead  . EKG 12-Lead  . EKG 12-Lead  . EKG 12-Lead    Telemetry: NSR - personally reviewed 01/13/2017  Echo:  EF 35-40%  Mild MR   R/ LHC 01/10/17:  Dist LAD lesion, 100 %stenosed.  Prox RCA lesion, 40 %stenosed.  3rd Mrg lesion, 40 %stenosed.  There is severe left ventricular systolic dysfunction.  The left ventricular ejection fraction is less than 25% by visual estimate.  LV end diastolic pressure is severely  elevated.  Hemodynamic findings consistent with moderate pulmonary hypertension.    Acute atrial fibrillation developed on the cath table during right heart/right atrial pressure recording. Rate 118 bpm. Atrial fibrillation was treated with one bolus of IV amiodarone.  40% proximal RCA stenosis.  Tortuous but normal circumflex  100% distal/apical LAD stenosis. This lesion is probably chronic but difficult to determine age. There is also a possibility that it is acute embolic occlusion from a catheter-based thrombus. For that reason additional heparin and then Aggrastat bolus was given as the patient complained of chest pressure post procedure.  Chest pressure post procedure is similar to prior episodes.  Severe LV dysfunction with EF less than 25%. Contraction pattern appears global.  Arterial blood gas demonstrated severe CO2 retention, pCO2 67.  Medications:   . apixaban  5 mg Oral BID  . atorvastatin  80 mg Oral q1800  . bisoprolol  2.5 mg Oral Daily  . budesonide (PULMICORT) nebulizer solution  0.5 mg  Nebulization BID  . clopidogrel  75 mg Oral Daily  . furosemide  40 mg Oral Daily  . lisinopril  10 mg Oral Daily  . potassium chloride  20 mEq Oral Daily  . predniSONE  20 mg Oral Q breakfast       Assessment/Plan:   CAD: Cad as below. Medical management only.   NICM: Likely non-ischemic in etiology. R/ LHC showing 100% distal LAD stenosis, 40% proximal RCA stenosis, 40% 3rd marginal stenosis, severe LV systolic dysfunction with EF <25%, severely elevated LV EDP, and moderate pulmonary HTN.  On oral Lasix 40 mg daily. Net -795 cc. Medical mgmt for CAD as no good options for PCI.  -Oral Lasix 40 mg daily  -Bisoprolol to 2.5 mg daily -Lisinopril to 10 mg daily  -Plavix 75 mg daily  Acute Afib:  Patient developed acute Afib during her cath procedure. She is in NSR now. She did have a mild troponin elevation which suggests distal LAD lesion could have been acute. Cannot  determine this for sure. Unable to determine if this was a catheter based thrombus. Will treat as if this was a coronary thrombus. -Bisoprolol 2.5 mg daily  -Eliquis 5 mg BID for long-term anticougulation  HLD: Continue Lipitor 80 mg daily   COPD: Wheezing on exam. Continue albuterol and budesonide. Treatment per primary team.   Shela Leff, MD PGY2 - IMTS Pager [phone]  I have personally seen and examined this patient with Dr. Marlowe Sax. I agree with the assessment and plan as outlined above. She is stable this am. She is on a beta blocker and Ace-inh given her LV dysfunction. Medical management of CAD including Plavix. Sinus this am. Will continue Eliquis. Should be ready for d/c from cardiac perspective. She will follow up in our [facility] office in [place].   Response to coding question: The thrombus is the distal LAD may have been associated with the catheter. This cannot be clinically determined. The elevated troponin could be secondary to the thrombus but this cannot be clinically determined.   Lauree Chandler  01/13/2017 9:42 AM

## 2017-01-13 NOTE — Care Management Important Message (Signed)
Important Message  Patient Details  Name: Pamela Soto MRN: [id number] Date of Birth: [birth year]   Medicare Important Message Given:  Yes    Shalina Norfolk 01/13/2017, 11:49 AM

## 2017-01-14 ENCOUNTER — Encounter: Payer: Self-pay | Admitting: Cardiology

## 2017-01-14 ENCOUNTER — Telehealth: Payer: Self-pay | Admitting: Cardiovascular Disease

## 2017-01-14 MED ORDER — ALBUTEROL SULFATE (2.5 MG/3ML) 0.083% IN NEBU: 2.5000 mg | INHALATION_SOLUTION | Freq: Four times a day (QID) | RESPIRATORY_TRACT | 12 refills | Status: DC | PRN

## 2017-01-14 MED ORDER — ATORVASTATIN CALCIUM 80 MG PO TABS: 80.0000 mg | ORAL_TABLET | Freq: Every day | ORAL | 0 refills | Status: DC

## 2017-01-14 MED ORDER — BISOPROLOL FUMARATE 5 MG PO TABS: 2.5000 mg | ORAL_TABLET | Freq: Every day | ORAL | 0 refills | Status: DC

## 2017-01-14 MED ORDER — POTASSIUM CHLORIDE CRYS ER 20 MEQ PO TBCR: 20.0000 meq | EXTENDED_RELEASE_TABLET | Freq: Every day | ORAL | 0 refills | Status: DC

## 2017-01-14 MED ORDER — CLOPIDOGREL BISULFATE 75 MG PO TABS: 75.0000 mg | ORAL_TABLET | Freq: Every day | ORAL | 0 refills | Status: DC

## 2017-01-14 MED ORDER — APIXABAN 5 MG PO TABS: 5.0000 mg | ORAL_TABLET | Freq: Two times a day (BID) | ORAL | 0 refills | Status: DC

## 2017-01-14 MED ORDER — LISINOPRIL 10 MG PO TABS: 10.0000 mg | ORAL_TABLET | Freq: Every day | ORAL | 0 refills | Status: DC

## 2017-01-14 MED ORDER — FUROSEMIDE 40 MG PO TABS: 40.0000 mg | ORAL_TABLET | Freq: Every day | ORAL | 0 refills | Status: DC

## 2017-01-14 NOTE — Progress Notes (Signed)
Physical Therapy Treatment Patient Details Name: Pamela Soto MRN: [id number] DOB: March 11, 1951 Today's Date: 30-Jan-2017    History of Present Illness Pt with respiratory failure and cardiomyopathy/acute CHF. PMH - copd,     PT Comments    Pt presented supine in bed with HOB elevated, awake and willing to participate in therapy session. Pt making good progress with mobility, ambulating on RA with SPO2 maintaining >88%. PT will continue to follow acutely.   Follow Up Recommendations  No PT follow up;Supervision - Intermittent     Equipment Recommendations  None recommended by PT    Recommendations for Other Services       Precautions / Restrictions Precautions Precautions: None Restrictions Weight Bearing Restrictions: No    Mobility  Bed Mobility Overal bed mobility: Modified Independent                Transfers Overall transfer level: Modified independent Equipment used: None                Ambulation/Gait Ambulation/Gait assistance: Supervision Ambulation Distance (Feet): 200 Feet Assistive device: None Gait Pattern/deviations: Step-through pattern;Decreased stride length;Drifts right/left Gait velocity: decreased Gait velocity interpretation: Below normal speed for age/gender General Gait Details: no instability or LOB; pt ambulated on RA with SPO2 maintaining >88%   Stairs            Wheelchair Mobility    Modified Rankin (Stroke Patients Only)       Balance Overall balance assessment: Needs assistance Sitting-balance support: No upper extremity supported;Feet supported Sitting balance-Leahy Scale: Normal     Standing balance support: No upper extremity supported;During functional activity Standing balance-Leahy Scale: Fair                      Cognition Arousal/Alertness: Awake/alert Behavior During Therapy: WFL for tasks assessed/performed Overall Cognitive Status: Within Functional Limits for tasks assessed                       Exercises      General Comments        Pertinent Vitals/Pain Pain Assessment: No/denies pain    Home Living                      Prior Function            PT Goals (current goals can now be found in the care plan section) Acute Rehab PT Goals Patient Stated Goal: return home today PT Goal Formulation: With patient Time For Goal Achievement: 01/18/17 Potential to Achieve Goals: Good Progress towards PT goals: Progressing toward goals    Frequency    Min 3X/week      PT Plan Current plan remains appropriate    Co-evaluation             End of Session   Activity Tolerance: Patient tolerated treatment well Patient left: with call bell/phone within reach;in bed (sitting EOB)     Time: [id number] PT Time Calculation (min) (ACUTE ONLY): 12 min  Charges:  $Gait Training: 8-22 mins                    G CodesClearnce Sorrel Dhilan Soto Jan 30, 2017, 12:34 PM Sherie Don, PT, DPT [phone]

## 2017-01-14 NOTE — Discharge Summary (Signed)
Pamela Soto [id number] DOB: 1951-11-13 DOA: 01/04/2017  PCP: No PCP Per Patient  Admit date: 01/04/2017  Discharge date: 01/14/2017  Admitted From: Home   Disposition:  Home   Recommendations for Outpatient Follow-up:   Follow up with PCP in 1-2 weeks  PCP Please obtain BMP/CBC, 2 view CXR in 1week,  (see Discharge instructions)   PCP Please follow up on the following pending results: None   Home Health: None   Equipment/Devices: Neb machine  Consultations: Cards Discharge Condition: Stable   CODE STATUS: Full   Diet Recommendation: Heart Healthy with fluid restriction   Chief Complaint  Patient presents with  . Shortness of Breath     Brief history of present illness from the day of admission and additional interim summary     66 year old female with a history of COPD, GERD, and morbid obesity who presented with a 2 year history of shortness breath that has worsened over 2weeksprior to admission. The patient was noted to be wheezing and hypoxemic in the emergency department with saturation in the upper 80s. She was given bronchodilators and IV steroids with some improvement in the ED, but the patient remained dyspneic on exertion. The patient denied any worsening lower extremity edema, but complained of increasing abdominal girth.   The patient was treated for COPD exacerbation, but initial history and exam suggested respiratory failure from a degree of fluid overload as well as multifactorial etiologies. The patient was treated with intravenous furosemide with good clinical effect.Cardiology was consulted for newly diagnosed cardiomyopathy with EF 35-40 percent. The patient was transferred to [facility] 01/10/2017 for heart catheterization.      Hospital Course   Acute respiratory failure with hypoxia Due to acute on chronic combined systolic and diastolic CHF with ischemic cardiomyopathy, deconditioning and possible undiagnosed obstructive sleep apnea.  Significantly improved after treatment with diuretics, now symptom-free without any shortness of breath or oxygen requirement, stable on room air. Will be placed on beta blocker, diuretics and discharged home with close PCP and cardiology follow-up. She is close to baseline.  CAD She underwent left heart catheterization as below, Medical management per Cards as no option for intervention - currently on combination of Plavix, statin, beta blocker and Eliquis for secondary prevention. Follow with PCP for secondary risk factor modulation along with cardiology after discharge.   Transient Afib developed acute Afib during cath and was given a bolus of IV Amiodarone transiently - now on beta blocker and Eliquis initiated per Cards, case management to assist.  Acute on chronic combined systolic and diastolic CHF EF [id number]. Due to ischemic cardiomyopathy TTE noted EF 35-40 percent, diffuse HK, mild MR - resolved after diuresis, continue beta blocker, Lasix and ACE inhibitor upon discharge along with salt and fluid restriction. PCP to monitor BMP closely  HLD Cont lipitor at 80mg   Morbid obesity - Body mass index is 41.44 kg/m. Begin PT/OT Will follow with PCP for weight loss and possible outpatient sleep study  Impaired glucose Tolerance A1C 6.1, Prediabetic follow with PCP.  Leukocytosis. Afebrile, trend improving, likely due to thyroid which have now been stopped, request PCP to recheck CBC in 7-10 days   Discharge diagnosis     Principal Problem:   Acute systolic heart failure (HCC) Active Problems:   COPD exacerbation (HCC)   Acute respiratory failure with hypoxia (HCC)   Hyperglycemia   Acute exacerbation of chronic obstructive pulmonary disease (COPD) (HCC)    Impaired glucose tolerance   Paroxysmal atrial fibrillation (HCC)   Coronary artery disease involving native coronary artery of native heart    Discharge instructions    Discharge Instructions    Discharge instructions    Complete by:  As directed    Follow with Primary MD in 7 days   Get CBC, CMP, 2 view Chest X ray checked  by Primary MD or SNF MD in 5-7 days ( we routinely change or add medications that can affect your baseline labs and fluid status, therefore we recommend that you get the mentioned basic workup next visit with your PCP, your PCP may decide not to get them or add new tests based on their clinical decision)  Activity: As tolerated with Full fall precautions use walker/cane & assistance as needed  Disposition Home    Diet: Heart Healthy   Check your Weight same time everyday, if you gain over 2 pounds, or you develop in leg swelling, experience more shortness of breath or chest pain, call your Primary MD immediately. Follow Cardiac Low Salt Diet and 1.5 lit/day fluid restriction.  On your next visit with your primary care physician please Get Medicines reviewed and adjusted.  Please request your Prim.MD to go over all Hospital Tests and Procedure/Radiological results at the follow up, please get all Hospital records sent to your Prim MD by signing hospital release before you go home.  If you experience worsening of your admission symptoms, develop shortness of breath, life threatening emergency, suicidal or homicidal thoughts you must seek medical attention immediately by calling 911 or calling your MD immediately  if symptoms less severe.  You Must read complete instructions/literature along with all the possible adverse reactions/side effects for all the Medicines you take and that have been prescribed to you. Take any new Medicines after you have completely understood and accpet all the possible adverse reactions/side effects.   Do not drive, operate heavy machinery,  perform activities at heights, swimming or participation in water activities or provide baby sitting services if your were admitted for syncope or siezures until you have seen by Primary MD or a Neurologist and advised to do so again.  Do not drive when taking Pain medications.    Do not take more than prescribed Pain, Sleep and Anxiety Medications  Special Instructions: If you have smoked or chewed Tobacco  in the last 2 yrs please stop smoking, stop any regular Alcohol  and or any Recreational drug use.  Wear Seat belts while driving.   Please note  You were cared for by a hospitalist during your hospital stay. If you have any questions about your discharge medications or the care you received while you were in the hospital after you are discharged, you can call the unit and asked to speak with the hospitalist on call if the hospitalist that took care of you is not available. Once you are discharged, your primary care physician will handle any further medical issues. Please note that NO REFILLS for any discharge medications will be authorized once you are discharged, as it is imperative that  you return to your primary care physician (or establish a relationship with a primary care physician if you do not have one) for your aftercare needs so that they can reassess your need for medications and monitor your lab values.   Increase activity slowly    Complete by:  As directed       Discharge Medications   Allergies as of 01/14/2017   No Known Allergies     Medication List    STOP taking these medications   ibuprofen 200 MG tablet Commonly known as:  ADVIL,MOTRIN   metoprolol tartrate 25 MG tablet Commonly known as:  LOPRESSOR     TAKE these medications   albuterol (2.5 MG/3ML) 0.083% nebulizer solution Commonly known as:  PROVENTIL Take 3 mLs (2.5 mg total) by nebulization every 6 (six) hours as needed for wheezing or shortness of breath.   apixaban 5 MG Tabs tablet Commonly  known as:  ELIQUIS Take 1 tablet (5 mg total) by mouth 2 (two) times daily.   atorvastatin 80 MG tablet Commonly known as:  LIPITOR Take 1 tablet (80 mg total) by mouth daily at 6 PM.   bisoprolol 5 MG tablet Commonly known as:  ZEBETA Take 0.5 tablets (2.5 mg total) by mouth daily. Start taking on:  01/15/2017   clopidogrel 75 MG tablet Commonly known as:  PLAVIX Take 1 tablet (75 mg total) by mouth daily. Start taking on:  01/15/2017   furosemide 40 MG tablet Commonly known as:  LASIX Take 1 tablet (40 mg total) by mouth daily. Start taking on:  01/15/2017   lisinopril 10 MG tablet Commonly known as:  PRINIVIL,ZESTRIL Take 1 tablet (10 mg total) by mouth daily. Start taking on:  01/15/2017   potassium chloride SA 20 MEQ tablet Commonly known as:  K-DUR,KLOR-CON Take 1 tablet (20 mEq total) by mouth daily. Start taking on:  01/15/2017            Durable Medical Equipment        Start     Ordered   01/14/17 1100  For home use only DME Nebulizer/meds  Once    Question:  Patient needs a nebulizer to treat with the following condition  Answer:  SOB (shortness of breath)   01/14/17 1059      Follow-up Information    Wilfred Lacy, NP Follow up on 02/17/2017.   Specialty:  Internal Medicine Why:  at 11 am; please try to keep your apt or call to reschedule Contact information: [address] [address] [phone]        Lauree Chandler, MD. Schedule an appointment as soon as possible for a visit in 1 week(s).   Specialty:  Cardiology Contact information: [address] [place] [address] [phone]           Major procedures and Radiology Reports - PLEASE review detailed and final reports thoroughly  -      2/1 TTE - Left ventricle: The cavity size was mildly dilated. Wallthickness was increased in a pattern of moderate LVH. Systolicfunction was moderately reduced. The estimated ejection fractionwas in the range of  35% to 40%. Diffuse hypokinesis. - Aortic valve: Mildly calcified annulus. Trileaflet; mildlythickened leaflets. - Mitral valve: There was mild regurgitation. - Left atrium: The atrium was moderately dilated. - Atrial septum: The septum bowed from left to right, consistentwith increased left atrial pressure. - Pulmonic valve: There was mild regurgitation.  2/5 cardiac cath:  40% proximal RCA stenosis -  Tortuous but normal circumflex - 100% distal/apical LAD stenosis - Severe LV dysfunction with EF less than 25%.   Dg Chest 2 View  Result Date: 01/07/2017 CLINICAL DATA:  CHF. EXAM: CHEST  2 VIEW COMPARISON:  01/04/2017. FINDINGS: Mediastinum hilar structures stable. Stable cardiomegaly. No evidence of overt congestive heart failure. Low lung volumes. IMPRESSION: 1. Stable chest. Stable cardiomegaly. No evidence of overt congestive heart failure. 2. Low lung volumes . Electronically Signed   By: Marcello Moores  Register   On: 01/07/2017 09:31   Dg Chest 2 View  Result Date: 01/04/2017 CLINICAL DATA:  Shortness of breath for 2-3 days, some chest pain EXAM: CHEST  2 VIEW COMPARISON:  Chest x-ray 10/13/2012 FINDINGS: No active infiltrate or effusion is seen. Mediastinal and hilar contours are unremarkable. Cardiomegaly is stable. No acute bony abnormality is seen. IMPRESSION: Stable cardiomegaly.  No active lung disease. Electronically Signed   By: Ivar Drape M.D.   On: 01/04/2017 11:16    Micro Results     Recent Results (from the past 240 hour(s))  MRSA PCR Screening     Status: None   Collection Time: 01/10/17  4:52 PM  Result Value Ref Range Status   MRSA by PCR NEGATIVE NEGATIVE Final    Comment:        The GeneXpert MRSA Assay (FDA approved for NASAL specimens only), is one component of a comprehensive MRSA colonization surveillance program. It is not intended to diagnose MRSA infection nor to guide or monitor treatment for MRSA infections.     Today   Subjective    Karrie Bonn today has no headache,no chest abdominal pain,no new weakness tingling or numbness, feels much better wants to go home today.     Objective   Blood pressure (!) 114/43, pulse 77, temperature 98.4 F (36.9 C), temperature source Oral, resp. rate 18, height 5\' 5"  (1.651 m), weight 112.6 kg (248 lb 4.8 oz), SpO2 94 %.   Intake/Output Summary (Last 24 hours) at 01/14/17 1059 Last data filed at 01/14/17 0840  Gross per 24 hour  Intake              600 ml  Output             1401 ml  Net             -801 ml    Exam Awake Alert, Oriented x 3, No new F.N deficits, Normal affect [place].AT,PERRAL Supple Neck,No JVD, No cervical lymphadenopathy appriciated.  Symmetrical Chest wall movement, Good air movement bilaterally, CTAB RRR,No Gallops,Rubs or new Murmurs, No Parasternal Heave +ve B.Sounds, Abd Soft, Non tender, No organomegaly appriciated, No rebound -guarding or rigidity. No Cyanosis, Clubbing or edema, No new Rash or bruise   Data Review   CBC w Diff: Lab Results  Component Value Date   WBC 14.2 (H) 01/13/2017   HGB 14.0 01/13/2017   HCT 43.8 01/13/2017   PLT 236 01/13/2017   LYMPHOPCT 10 (L) 11/05/2012   MONOPCT 6 11/05/2012   EOSPCT 0 11/05/2012   BASOPCT 0 11/05/2012    CMP: Lab Results  Component Value Date   NA 140 01/13/2017   K 4.1 01/13/2017   CL 96 (L) 01/13/2017   CO2 34 (H) 01/13/2017   BUN 17 01/13/2017   CREATININE 0.92 01/13/2017   PROT 6.7 01/09/2017   ALBUMIN 3.5 01/09/2017   BILITOT 1.2 01/09/2017   ALKPHOS 39 01/09/2017   AST 17 01/09/2017   ALT 25 01/09/2017  .  Total Time in preparing paper work, data evaluation and todays exam - 35 minutes  Thurnell Lose M.D on 01/14/2017 at [place]  [phone]

## 2017-01-14 NOTE — Telephone Encounter (Signed)
New message      Appt made for 7-10 days by a nurse at the [facility].  Appt made on 01-20-17.  Not sure if this is a TOC but sending msg to triage

## 2017-01-14 NOTE — Progress Notes (Signed)
Subjective:  Patient denies having any CP, SOB, or palpitations. Tele showing sinus bradycardia HR 56 when the patient was sleeping.   Objective:  Vitals:   01/13/17 1939 01/13/17 2140 01/14/17 0537 01/14/17 0806  BP: (!) 111/53  (!) 114/43   Pulse: 72  77   Resp: 18  18   Temp: 98.3 F (36.8 C)  98.4 F (36.9 C)   TempSrc: Oral  Oral   SpO2: 95% (!) 89% 98% 94%  Weight:   248 lb 4.8 oz (112.6 kg)   Height:        Intake/Output from previous day:  Intake/Output Summary (Last 24 hours) at 01/14/17 [id number] Last data filed at 01/14/17 0500  Gross per 24 hour  Intake              360 ml  Output             1401 ml  Net            -1041 ml    Physical Exam: Obese female Heart:  S1/S2. No murmur, no rub, gallop or click. No pedal edema.  Lungs: CTAB. No wheezes, rales, or rhonchi.   Abdomen: soft, +BS positve, no distention, no tenderness Neuro: non-focal Skin: warm and dry MSK: No muscular weakness   Lab Results: Basic Metabolic Panel:  Recent Labs  01/12/17 0233 01/13/17 0556  NA 139 140  K 4.5 4.1  CL 93* 96*  CO2 36* 34*  GLUCOSE 115* 89  BUN 20 17  CREATININE 1.08* 0.92  CALCIUM 8.7* 8.7*  MG 2.1  --    CBC:  Recent Labs  01/12/17 0233 01/13/17 0556  WBC 16.9* 14.2*  HGB 13.9 14.0  HCT 43.8 43.8  MCV 94.8 93.2  PLT 249 236   Cardiac Studies:  ECG:  Orders placed or performed during the hospital encounter of 01/04/17  . EKG 12-Lead  . EKG 12-Lead  . EKG  . EKG 12-Lead  . EKG 12-Lead  . EKG 12-Lead  . EKG 12-Lead  . EKG 12-Lead  . EKG 12-Lead    Echo:  EF 35-40%  Mild MR   R/ LHC 01/10/17:  Dist LAD lesion, 100 %stenosed.  Prox RCA lesion, 40 %stenosed.  3rd Mrg lesion, 40 %stenosed.  There is severe left ventricular systolic dysfunction.  The left ventricular ejection fraction is less than 25% by visual estimate.  LV end diastolic pressure is severely elevated.  Hemodynamic findings consistent with moderate pulmonary  hypertension.    Acute atrial fibrillation developed on the cath table during right heart/right atrial pressure recording. Rate 118 bpm. Atrial fibrillation was treated with one bolus of IV amiodarone.  40% proximal RCA stenosis.  Tortuous but normal circumflex  100% distal/apical LAD stenosis. This lesion is probably chronic but difficult to determine age. There is also a possibility that it is acute embolic occlusion from a catheter-based thrombus. For that reason additional heparin and then Aggrastat bolus was given as the patient complained of chest pressure post procedure.  Chest pressure post procedure is similar to prior episodes.  Severe LV dysfunction with EF less than 25%. Contraction pattern appears global.  Arterial blood gas demonstrated severe CO2 retention, pCO2 67.  Medications:   . apixaban  5 mg Oral BID  . atorvastatin  80 mg Oral q1800  . bisoprolol  2.5 mg Oral Daily  . budesonide (PULMICORT) nebulizer solution  0.5 mg Nebulization BID  . clopidogrel  75 mg Oral Daily  .  furosemide  40 mg Oral Daily  . lisinopril  10 mg Oral Daily  . potassium chloride  20 mEq Oral Daily       Assessment/Plan:   CAD: CAD as below. Medical management only.   NICM: Likely non-ischemic in etiology. R/ LHC showing 100% distal LAD stenosis, 40% proximal RCA stenosis, 40% 3rd marginal stenosis, severe LV systolic dysfunction with EF <25%, severely elevated LV EDP, and moderate pulmonary HTN.  On oral Lasix 40 mg daily. Net -795 cc. Medical mgmt for CAD as no good options for PCI. Continue BB and ACEI for LV dysfunction.  -Oral Lasix 40 mg daily  -Bisoprolol to 2.5 mg daily -Lisinopril to 10 mg daily  -Plavix 75 mg daily  Acute Afib:  Patient developed acute Afib during her cath procedure. She is in sinus rhythm now. Tele this morning showing sinus bradycardia when the patient was sleeping. She did have a mild troponin elevation which suggests distal LAD lesion could have been  acute. Cannot determine this for sure. Unable to determine if this was a catheter based thrombus. Will treat as if this was a coronary thrombus. -Bisoprolol 2.5 mg daily  -Eliquis 5 mg BID for long-term anticougulation  HLD: Continue Lipitor 80 mg daily   COPD: Continue albuterol and budesonide. Treatment per primary team.   Shela Leff, MD PGY2 - IMTS Pager [phone]  I have personally seen and examined this patient with Dr. Marlowe Sax. I agree with the assessment and plan as outlined above. She is stable this am. Medical management of CAD and NICM as above. Continue Eliquis for PAF. We will arrange f/u in our [facility] office in [place].   Lauree Chandler 01/14/2017 11:01 AM

## 2017-01-14 NOTE — Progress Notes (Addendum)
Pt has orders to be discharged. Discharge instructions given and pt has no additional questions at this time. Medication regimen reviewed and pt educated. Pt verbalized understanding and has no additional questions. Telemetry box removed. IV removed and site in good condition. Nebulizer machine delivered to room. Pt stable and waiting for transportation.

## 2017-01-14 NOTE — Progress Notes (Signed)
[facility] called for home nebulizer machine to be delivered to the patient's room today prior to discharging home; Also patient received her Eliquis coupon card by previous CM; [employer] [phone]

## 2017-01-17 NOTE — Telephone Encounter (Signed)
Called patient- left msg to call back.

## 2017-01-18 NOTE — Telephone Encounter (Signed)
Patient contacted regarding discharge from [facility] on 01/14/17.  Patient understands to follow up with provider Lyda Jester, PA on 01/20/17 at 11:00 am at [address], [place], [place] [address].  Patient understands discharge instructions? Yes  Patient understands medications and regiment? Yes  Patient understands to bring all medications to this visit? Yes

## 2017-01-18 NOTE — Telephone Encounter (Signed)
Left message for patient to call back  

## 2017-01-20 ENCOUNTER — Ambulatory Visit (INDEPENDENT_AMBULATORY_CARE_PROVIDER_SITE_OTHER): Payer: Medicare Other | Admitting: Cardiology

## 2017-01-20 ENCOUNTER — Encounter: Payer: Self-pay | Admitting: Cardiology

## 2017-01-20 VITALS — BP 90/50 | HR 75 | Ht 65.0 in | Wt 249.1 lb

## 2017-01-20 DIAGNOSIS — J441 Chronic obstructive pulmonary disease with (acute) exacerbation: Secondary | ICD-10-CM | POA: Diagnosis not present

## 2017-01-20 DIAGNOSIS — Z5181 Encounter for therapeutic drug level monitoring: Secondary | ICD-10-CM

## 2017-01-20 DIAGNOSIS — I2510 Atherosclerotic heart disease of native coronary artery without angina pectoris: Secondary | ICD-10-CM | POA: Diagnosis not present

## 2017-01-20 DIAGNOSIS — I5021 Acute systolic (congestive) heart failure: Secondary | ICD-10-CM | POA: Diagnosis not present

## 2017-01-20 DIAGNOSIS — I429 Cardiomyopathy, unspecified: Secondary | ICD-10-CM | POA: Diagnosis not present

## 2017-01-20 MED ORDER — LISINOPRIL 5 MG PO TABS: 5.0000 mg | ORAL_TABLET | Freq: Every day | ORAL | 3 refills | Status: DC

## 2017-01-20 NOTE — Patient Instructions (Signed)
Medication Instructions:  DECREASE LISINOPRIL TO 5 MG DAILY.  Labwork: LABS TODAY: PRO BNP, BMET, CBC  Testing/Procedures: NONE ORDERED TODAY  Follow-Up: Your physician recommends that you schedule a follow-up appointment in: 2-3 Liberal.   Any Other Special Instructions Will Be Listed Below (If Applicable).   Low-Sodium Eating Plan Sodium raises blood pressure and causes water to be held in the body. Getting less sodium from food will help lower your blood pressure, reduce any swelling, and protect your heart, liver, and kidneys. We get sodium by adding salt (sodium chloride) to food. Most of our sodium comes from canned, boxed, and frozen foods. Restaurant foods, fast foods, and pizza are also very high in sodium. Even if you take medicine to lower your blood pressure or to reduce fluid in your body, getting less sodium from your food is important. What is my plan? Most people should limit their sodium intake to 2,300 mg a day. Your health care provider recommends that you limit your sodium intake to __________ a day. What do I need to know about this eating plan? For the low-sodium eating plan, you will follow these general guidelines:  Choose foods with a % Daily Value for sodium of less than 5% (as listed on the food label).  Use salt-free seasonings or herbs instead of table salt or sea salt.  Check with your health care provider or pharmacist before using salt substitutes.  Eat fresh foods.  Eat more vegetables and fruits.  Limit canned vegetables. If you do use them, rinse them well to decrease the sodium.  Limit cheese to 1 oz (28 g) per day.  Eat lower-sodium products, often labeled as "lower sodium" or "no salt added."  Avoid foods that contain monosodium glutamate (MSG). MSG is sometimes added to [place] food and some canned foods.  Check food labels (Nutrition Facts labels) on foods to learn how much sodium is in one  serving.  Eat more home-cooked food and less restaurant, buffet, and fast food.  When eating at a restaurant, ask that your food be prepared with less salt, or no salt if possible. How do I read food labels for sodium information? The Nutrition Facts label lists the amount of sodium in one serving of the food. If you eat more than one serving, you must multiply the listed amount of sodium by the number of servings. Food labels may also identify foods as:  Sodium free-Less than 5 mg in a serving.  Very low sodium-35 mg or less in a serving.  Low sodium-140 mg or less in a serving.  Light in sodium-50% less sodium in a serving. For example, if a food that usually has 300 mg of sodium is changed to become light in sodium, it will have 150 mg of sodium.  Reduced sodium-25% less sodium in a serving. For example, if a food that usually has 400 mg of sodium is changed to reduced sodium, it will have 300 mg of sodium. What foods can I eat? Grains  Low-sodium cereals, including oats, puffed wheat and rice, and shredded wheat cereals. Low-sodium crackers. Unsalted rice and pasta. Lower-sodium bread. Vegetables  Frozen or fresh vegetables. Low-sodium or reduced-sodium canned vegetables. Low-sodium or reduced-sodium tomato sauce and paste. Low-sodium or reduced-sodium tomato and vegetable juices. Fruits  Fresh, frozen, and canned fruit. Fruit juice. Meat and Other Protein Products  Low-sodium canned tuna and salmon. Fresh or frozen meat, poultry, seafood, and fish. Lamb. Unsalted nuts. Dried beans,  peas, and lentils without added salt. Unsalted canned beans. Homemade soups without salt. Eggs. Dairy  Milk. Soy milk. Ricotta cheese. Low-sodium or reduced-sodium cheeses. Yogurt. Condiments  Fresh and dried herbs and spices. Salt-free seasonings. Onion and garlic powders. Low-sodium varieties of mustard and ketchup. Fresh or refrigerated horseradish. Lemon juice. Fats and Oils  Reduced-sodium salad  dressings. Unsalted butter. Other  Unsalted popcorn and pretzels. The items listed above may not be a complete list of recommended foods or beverages. Contact your dietitian for more options.  What foods are not recommended? Grains  Instant hot cereals. Bread stuffing, pancake, and biscuit mixes. Croutons. Seasoned rice or pasta mixes. Noodle soup cups. Boxed or frozen macaroni and cheese. Self-rising flour. Regular salted crackers. Vegetables  Regular canned vegetables. Regular canned tomato sauce and paste. Regular tomato and vegetable juices. Frozen vegetables in sauces. Salted [place] fries. Olives. Angie Fava. Relishes. Sauerkraut. Salsa. Meat and Other Protein Products  Salted, canned, smoked, spiced, or pickled meats, seafood, or fish. Bacon, ham, sausage, hot dogs, corned beef, chipped beef, and packaged luncheon meats. Salt pork. Jerky. Pickled herring. Anchovies, regular canned tuna, and sardines. Salted nuts. Dairy  Processed cheese and cheese spreads. Cheese curds. Blue cheese and cottage cheese. Buttermilk. Condiments  Onion and garlic salt, seasoned salt, table salt, and sea salt. Canned and packaged gravies. Worcestershire sauce. Tartar sauce. Barbecue sauce. Teriyaki sauce. Soy sauce, including reduced sodium. Steak sauce. Fish sauce. Oyster sauce. Cocktail sauce. Horseradish that you find on the shelf. Regular ketchup and mustard. Meat flavorings and tenderizers. Bouillon cubes. Hot sauce. Tabasco sauce. Marinades. Taco seasonings. Relishes. Fats and Oils  Regular salad dressings. Salted butter. Margarine. Ghee. Bacon fat. Other  Potato and tortilla chips. Corn chips and puffs. Salted popcorn and pretzels. Canned or dried soups. Pizza. Frozen entrees and pot pies. The items listed above may not be a complete list of foods and beverages to avoid. Contact your dietitian for more information.  This information is not intended to replace advice given to you by your health care provider.  Make sure you discuss any questions you have with your health care provider. Document Released: 05/14/2002 Document Revised: 04/29/2016 Document Reviewed: 09/26/2013 Elsevier Interactive Patient Education  2017 [employer].   Monitor important information  Weigh yourself every day. Keeping track of your weight daily helps you to notice excess fluid sooner.  Weigh yourself every morning after you urinate and before you eat breakfast.  Wear the same amount of clothing each time you weigh yourself.  Record your daily weight. Provide your health care provider with your weight record.  Monitor and record your blood pressure as told by your health care provider.  Check your pulse as told by your health care provider.    If you need a refill on your cardiac medications before your next appointment, please call your pharmacy.

## 2017-01-20 NOTE — Progress Notes (Signed)
01/20/2017 Pamela Soto   Jun 03, 1951  [id number]  Primary Physician No PCP Per Patient Primary Cardiologist: Dr. Harrington Challenger   Reason for Visit/CC: [facility] f/u for NICM/ Systolic HF  HPI:  The patient presents to clinic today for post hospital f/u after recent admission. She is a 66 y/o female with prior h/o COPD who presented to [facility] 01/04/17 with complaint of worsening dyspnea. She was treated for acute COPD exacerbation but underwent additional f/u to exclude other causes for her dyspnea. W/u included a 2D echo which revealed a new diagnosis of LV systolic dysfunction. EF was reduced at 35% to 40% with diffuse hypokinesis. There was moderate dilatation of the left atrium, with mild mitral valve and pulmonic valve regurgitation. As result of abnormal echocardiogram, cardiology wasconsulted for recommendations. She was treated with IV lasix. Once her pulmonary status improved, she underwent at [facility]. She was found to have a nonischemic cardiomyopathy. R/LHC showed 100% distal LAD stenosis, 40% proximal RCA stenosis, 40% 3rd marginal stenosis, severe LV systolic dysfunction with EF <25%, severely elevated LV EDP, and moderate pulmonary HTN. Medical management was recommended. Of note, it was also noted that she had an acute episode of atrial fibrillation during her cath and was given an IV bolus of amiodarone. She ultimately converted to NSR, but it was recommended that she continue Eliquis for a/c. She was also placed on Plavix, bisoprolol, lisinopril, lipitor and lasix. It was recommended that she f/u in our [place] office, but was somehow scheduled in our [place] office. Dr. Harrington Challenger was the first to see her during her admission, however she lives in [place].   She presents to clinic today for f/u. She feels much better than when she initially presented to the hospital, however she continues to have mild exertional dyspnea and occasional orthopnea/PND vs ?OSA. She notes that she snores and often wakes up at  times short of breath and having to sit up to catch her breath. She denies any resting dyspnea. No chest pain. Radial cath site is stable w/o complications. She reports full medication compliance. No intolerances but her BP is on the soft side. She admits to feeling tired some and occasional dizziness but no syncope/ near syncope. She denies palpitations.   EKG today shows NSR. HR is controlled in the 70s. BP is soft but stable at 90/50.   Current Meds  Medication Sig  . albuterol (PROVENTIL) (2.5 MG/3ML) 0.083% nebulizer solution Take 3 mLs (2.5 mg total) by nebulization every 6 (six) hours as needed for wheezing or shortness of breath.  [name] apixaban (ELIQUIS) 5 MG TABS tablet Take 1 tablet (5 mg total) by mouth 2 (two) times daily.  [name] atorvastatin (LIPITOR) 80 MG tablet Take 1 tablet (80 mg total) by mouth daily at 6 PM.  . bisoprolol (ZEBETA) 5 MG tablet Take 0.5 tablets (2.5 mg total) by mouth daily.  . clopidogrel (PLAVIX) 75 MG tablet Take 1 tablet (75 mg total) by mouth daily.  . furosemide (LASIX) 40 MG tablet Take 1 tablet (40 mg total) by mouth daily.  [name] lisinopril (PRINIVIL,ZESTRIL) 10 MG tablet Take 1 tablet (10 mg total) by mouth daily.  . potassium chloride SA (K-DUR,KLOR-CON) 20 MEQ tablet Take 1 tablet (20 mEq total) by mouth daily.   No Known Allergies Past Medical History:  Diagnosis Date  . Acid reflux    Family History  Problem Relation Age of Onset  . Heart disease Mother 12  . Heart attack Mother   . Asthma Father   .  Heart attack Brother 26   Past Surgical History:  Procedure Laterality Date  . CESAREAN SECTION    . CHOLECYSTECTOMY  11/06/2012   Procedure: LAPAROSCOPIC CHOLECYSTECTOMY WITH INTRAOPERATIVE CHOLANGIOGRAM;  Surgeon: Pedro Earls, MD;  Location: [facility];  Service: General;  Laterality: N/A;  . RIGHT/LEFT HEART CATH AND CORONARY ANGIOGRAPHY N/A 01/10/2017   Procedure: Right/Left Heart Cath and Coronary Angiography;  Surgeon: Belva Crome, MD;   Location: [place];  Service: Cardiovascular;  Laterality: N/A;  . UMBILICAL HERNIA REPAIR  11/06/2012   Procedure: HERNIA REPAIR UMBILICAL ADULT;  Surgeon: Pedro Earls, MD;  Location: [facility];  Service: General;;   Social History   Social History  . Marital status: Single    Spouse name: N/A  . Number of children: N/A  . Years of education: N/A   Occupational History  . Not on file.   Social History Main Topics  . Smoking status: Never Smoker  . Smokeless tobacco: Never Used  . Alcohol use No  . Drug use: No  . Sexual activity: Not on file   Other Topics Concern  . Not on file   Social History Narrative  . No narrative on file     Review of Systems: General: negative for chills, fever, night sweats or weight changes.  Cardiovascular: negative for chest pain, dyspnea on exertion, edema, orthopnea, palpitations, paroxysmal nocturnal dyspnea or shortness of breath Dermatological: negative for rash Respiratory: negative for cough or wheezing Urologic: negative for hematuria Abdominal: negative for nausea, vomiting, diarrhea, bright red blood per rectum, melena, or hematemesis Neurologic: negative for visual changes, syncope, or dizziness All other systems reviewed and are otherwise negative except as noted above.   Physical Exam:  Blood pressure (!) 90/50, pulse 75, height 5\' 5"  (1.651 m), weight 249 lb 1.9 oz (113 kg), SpO2 90 %.  General appearance: alert, cooperative, no distress and obese Neck: no carotid bruit and no JVD Lungs: clear to auscultation bilaterally Heart: regular rate and rhythm, S1, S2 normal, no murmur, click, rub or gallop Extremities: extremities normal, atraumatic, no cyanosis or edema Pulses: 2+ and symmetric Skin: Skin color, texture, turgor normal. No rashes or lesions Neurologic: Grossly normal  EKG NSR. 75 BPM.   ASSESSMENT AND PLAN:   1. Nonischemic Cardiomyopathy/ Chronic Systolic HF: EF [id number] by echo and estimated at 25%  by cath. Mild CAD noted on LHC but cardiomyopathy felt largely nonischemic. She appears euvolemic on physical exam however she notes mild exertional dyspnea and ? Orthopnea/PND vs spells from possible sleep apnea. We will check a BNP today to see if she will require any further titration of her lasix. BMP to check renal function and K. For now continue, 40 mg of Lasix daily. We will advise pt to increase to BID dosing if elevated BNP. Otherwise, she was advised to use sliding scale lasix adjustment (take an additional lasix tablet if > 3lb weight gain in 24 hrs or > 5 lb weight gain in 1 week). We discussed daily weights and low sodium diet, including how to read food labels to check sodium content. Continue BB and ARB. There is no room in BP to titrate HR meds today. Given soft BP with occasional dizziness, we will reduce lisinopril to 5 mg daily. F/u in 2-3 weeks.   2. CAD: Recent LHC showed 100% distal LAD stenosis, 40% proximal RCA stenosis, 40% 3rd marginal stenosis. No PCI attempt at LAD occlusion given distal location. Medical management recommended. She is  on Plavix. No ASA given Eliquis. She is also on a BB, statin and ACE-I.   3. PAF: EKG today shows NSR. HR is controlled. Continue BB therapy with bisoprolol and a/c with Eliquis. Given she is on Plavix for CAD, we will repeat a CBC to ensure H/H is stable.   4. HTN: BP is soft in the low 0000000 systolic. She notes some fatigue and occasional dizziness. She is on Eliquis. We will reduce lisinopril to 5 mg daily to reduce risk for hypotension/falls.   5. COPD: recovering from an acute exacerbation. Lungs are CTAB.   6. ? OSA: Pt notes h/o snoring. She wakes up often at night gasping for her breath. This may be orthopnea/PND, but she is at risk for OSA given her body habitus. She has never had a sleep study. I advise that we consider this in the near future but will allow more time for her to recover from recent hospitalization. We can consider  arranging at her next f/u visit in 2-3 weeks.   PLAN  F/u in our [place] office in 2-3 weeks for reassessment and possible titration of HF meds   Lyda Jester PA-C 01/20/2017 11:17 AM

## 2017-01-21 LAB — CBC
HEMOGLOBIN: 14.4 g/dL (ref 11.1–15.9)
Hematocrit: 42.9 % (ref 34.0–46.6)
MCH: 30.2 pg (ref 26.6–33.0)
MCHC: 33.6 g/dL (ref 31.5–35.7)
MCV: 90 fL (ref 79–97)
Platelets: 277 10*3/uL (ref 150–379)
RBC: 4.77 x10E6/uL (ref 3.77–5.28)
RDW: 14.2 % (ref 12.3–15.4)
WBC: 11.7 10*3/uL — ABNORMAL HIGH (ref 3.4–10.8)

## 2017-01-21 LAB — BASIC METABOLIC PANEL
BUN/Creatinine Ratio: 14 (ref 12–28)
BUN: 17 mg/dL (ref 8–27)
CALCIUM: 8.6 mg/dL — AB (ref 8.7–10.3)
CO2: 27 mmol/L (ref 18–29)
Chloride: 98 mmol/L (ref 96–106)
Creatinine, Ser: 1.22 mg/dL — ABNORMAL HIGH (ref 0.57–1.00)
GFR, EST AFRICAN AMERICAN: 54 mL/min/{1.73_m2} — AB (ref >59)
GFR, EST NON AFRICAN AMERICAN: 47 mL/min/{1.73_m2} — AB (ref >59)
Glucose: 94 mg/dL (ref 65–99)
POTASSIUM: 4.2 mmol/L (ref 3.5–5.2)
SODIUM: 142 mmol/L (ref 134–144)

## 2017-01-21 LAB — PRO B NATRIURETIC PEPTIDE: NT-Pro BNP: 344 pg/mL — ABNORMAL HIGH (ref 0–301)

## 2017-02-14 ENCOUNTER — Other Ambulatory Visit: Payer: Self-pay | Admitting: Cardiology

## 2017-02-15 ENCOUNTER — Other Ambulatory Visit: Payer: Self-pay | Admitting: *Deleted

## 2017-02-15 MED ORDER — POTASSIUM CHLORIDE CRYS ER 20 MEQ PO TBCR: 20.0000 meq | EXTENDED_RELEASE_TABLET | Freq: Every day | ORAL | 0 refills | Status: DC

## 2017-02-15 MED ORDER — APIXABAN 5 MG PO TABS: 5.0000 mg | ORAL_TABLET | Freq: Two times a day (BID) | ORAL | 0 refills | Status: DC

## 2017-02-15 MED ORDER — ATORVASTATIN CALCIUM 80 MG PO TABS: 80.0000 mg | ORAL_TABLET | Freq: Every day | ORAL | 0 refills | Status: DC

## 2017-02-15 MED ORDER — FUROSEMIDE 40 MG PO TABS: 40.0000 mg | ORAL_TABLET | Freq: Every day | ORAL | 0 refills | Status: DC

## 2017-02-15 MED ORDER — CLOPIDOGREL BISULFATE 75 MG PO TABS: 75.0000 mg | ORAL_TABLET | Freq: Every day | ORAL | 0 refills | Status: DC

## 2017-02-15 NOTE — Telephone Encounter (Signed)
Medication Detail    Disp Refills Start End   lisinopril (PRINIVIL,ZESTRIL) 5 MG tablet 90 tablet 3 01/20/2017 04/20/2017   Sig - Route: Take 1 tablet (5 mg total) by mouth daily. - Oral   E-Prescribing Status: Receipt confirmed by pharmacy (01/20/2017 11:53 AM EST)   Pharmacy   [facility] [place], [place], [place]

## 2017-02-17 ENCOUNTER — Other Ambulatory Visit (INDEPENDENT_AMBULATORY_CARE_PROVIDER_SITE_OTHER): Payer: Medicare Other

## 2017-02-17 ENCOUNTER — Encounter: Payer: Self-pay | Admitting: Nurse Practitioner

## 2017-02-17 ENCOUNTER — Ambulatory Visit (INDEPENDENT_AMBULATORY_CARE_PROVIDER_SITE_OTHER): Payer: Medicare Other | Admitting: Nurse Practitioner

## 2017-02-17 VITALS — BP 98/60 | HR 69 | Temp 98.0°F | Ht 65.0 in | Wt 251.0 lb

## 2017-02-17 DIAGNOSIS — R739 Hyperglycemia, unspecified: Secondary | ICD-10-CM

## 2017-02-17 DIAGNOSIS — R5383 Other fatigue: Secondary | ICD-10-CM

## 2017-02-17 DIAGNOSIS — I5021 Acute systolic (congestive) heart failure: Secondary | ICD-10-CM

## 2017-02-17 DIAGNOSIS — R400 Somnolence: Secondary | ICD-10-CM

## 2017-02-17 DIAGNOSIS — M791 Myalgia, unspecified site: Secondary | ICD-10-CM

## 2017-02-17 LAB — TSH: TSH: 4.35 u[IU]/mL (ref 0.35–4.50)

## 2017-02-17 LAB — HEMOGLOBIN A1C: Hgb A1c MFr Bld: 6.7 % — ABNORMAL HIGH (ref 4.6–6.5)

## 2017-02-17 LAB — T4, FREE: Free T4: 0.83 ng/dL (ref 0.60–1.60)

## 2017-02-17 MED ORDER — ACETAMINOPHEN 500 MG PO TABS: 500.0000 mg | ORAL_TABLET | Freq: Four times a day (QID) | ORAL | 0 refills | Status: DC | PRN

## 2017-02-17 MED ORDER — CO Q-10 50 MG PO CAPS: 1.0000 | ORAL_CAPSULE | Freq: Two times a day (BID) | ORAL | 0 refills | Status: DC

## 2017-02-17 NOTE — Progress Notes (Signed)
Subjective:    Patient ID: Pamela Soto, female    DOB: [birth year], 66 y.o.   MRN: [id number]  Patient presents today for establish care (new patient)  Muscle Pain  This is a new problem. The current episode started in the past 7 days. The problem occurs intermittently. The problem is unchanged. The context of the pain is unknown. Pain location: generalized. The pain is mild. The symptoms are aggravated by any movement. Associated symptoms include fatigue and shortness of breath. Pertinent negatives include no chest pain, constipation, diarrhea, dysuria, eye pain, fever, headaches, joint swelling, nausea, rash, sensory change, swollen glands, urinary symptoms, vaginal discharge, visual change, vomiting, weakness or wheezing. Past treatments include nothing. There is no swelling present.  onset of muscle pain and fatigue since start of medications.   No previous pcp.  CHF: Newly diagnosed.  Had f/up appt with cardiologist 01/20/17. Has another appt in 2weeks.  Possible OSA: Needs ref for sleep study.  Immunizations: (TDAP, Hep C screen, Pneumovax, Influenza, zoster)  Health Maintenance  Topic Date Due  .  Hepatitis C: One time screening is recommended by Center for Disease Control  (CDC) for  adults born from [birth year] through 1964.   1951/02/15  . HIV Screening  12/07/1966  . Tetanus Vaccine  12/07/1970  . Pap Smear  12/07/1972  . Colon Cancer Screening  12/07/2001  . Mammogram  12/08/2013  . Flu Shot  07/06/2016  . DEXA scan (bone density measurement)  12/07/2016  . Pneumonia vaccines (1 of 2 - PCV13) 12/07/2016   Diet:regular Weight:  Wt Readings from Last 3 Encounters:  02/17/17 251 lb (113.9 kg)  01/20/17 249 lb 1.9 oz (113 kg)  01/14/17 248 lb 4.8 oz (112.6 kg)   Exercise:none Fall Risk: Fall Risk  02/17/2017  Falls in the past year? No   Home Safety:home alone Depression/Suicide: Depression screen [facility] 2/9 02/17/2017  Decreased Interest 0  Down, Depressed, Hopeless 0    PHQ - 2 Score 0   No flowsheet data found. Colonoscopy (every 5-67yr, >50-784yr:needed Dexa (every 2-5y37yr>65y26yreeded Pap Smear (every 11yrs70yr >21-29 without HPV, every 39yrs 4yr>30-639yrs 10yrHPV):needed Mammogram (yearly, >439yrs):67yrd Vision:needed Dental:dentures Advanced Directive: Advanced Directives 01/04/2017  Does Patient Have a Medical Advance Directive? No  Would patient like information on creating a medical advance directive? No - Patient declined  Pre-existing out of facility DNR order (yellow form or pink MOST form) -     Medications and allergies reviewed with patient and updated if appropriate.  Patient Active Problem List   Diagnosis Date Noted  . Paroxysmal atrial fibrillation (HCC)   . Coronary artery disease involving native coronary artery of native heart   . Impaired glucose tolerance 01/07/2017  . Acute systolic heart failure (HCC) 02/[place]2018  . Acute respiratory failure with hypoxia (HCC) 01/[place]2018  . Hyperglycemia 01/05/2017  . Acute exacerbation of chronic obstructive pulmonary disease (COPD) (HCC)   [place] ([place] 01/[place]  . S/P laparoscopic cholecystectomy + IOC Dec 2013 11/06/2012  . GASTROESOPHAGEAL REFLUX DISEASE, HX OF 06/05/2008  . UTI'S, HX OF 06/05/2008  . CHEST PAIN, HX OF 05/30/2008    Current Outpatient Prescriptions on File Prior to Visit  Medication Sig Dispense Refill  . albuterol (PROVENTIL) (2.5 MG/3ML) 0.083% nebulizer solution Take 3 mLs (2.5 mg total) by nebulization every 6 (six) hours as needed for wheezing or shortness of breath. 75 mL 12  . apixaban (ELIQUIS) 5 MG TABS tablet Take 1 tablet (5 mg total) by  mouth 2 (two) times daily. 60 tablet 0  . atorvastatin (LIPITOR) 80 MG tablet Take 1 tablet (80 mg total) by mouth daily at 6 PM. 30 tablet 0  . bisoprolol (ZEBETA) 5 MG tablet Take 0.5 tablets (2.5 mg total) by mouth daily. 30 tablet 0  . clopidogrel (PLAVIX) 75 MG tablet Take 1 tablet (75 mg total)  by mouth daily. 30 tablet 0  . furosemide (LASIX) 40 MG tablet Take 1 tablet (40 mg total) by mouth daily. 30 tablet 0  . lisinopril (PRINIVIL,ZESTRIL) 5 MG tablet Take 1 tablet (5 mg total) by mouth daily. 90 tablet 3  . potassium chloride SA (K-DUR,KLOR-CON) 20 MEQ tablet Take 1 tablet (20 mEq total) by mouth daily. 30 tablet 0   No current facility-administered medications on file prior to visit.     Past Medical History:  Diagnosis Date  . Acid reflux   . Hypertension     Past Surgical History:  Procedure Laterality Date  . BREAST BIOPSY Right    normal result  . CESAREAN SECTION    . CHOLECYSTECTOMY  11/06/2012   Procedure: LAPAROSCOPIC CHOLECYSTECTOMY WITH INTRAOPERATIVE CHOLANGIOGRAM;  Surgeon: Pedro Earls, MD;  Location: [facility];  Service: General;  Laterality: N/A;  . RIGHT/LEFT HEART CATH AND CORONARY ANGIOGRAPHY N/A 01/10/2017   Procedure: Right/Left Heart Cath and Coronary Angiography;  Surgeon: Belva Crome, MD;  Location: [facility];  Service: Cardiovascular;  Laterality: N/A;  . UMBILICAL HERNIA REPAIR  11/06/2012   Procedure: HERNIA REPAIR UMBILICAL ADULT;  Surgeon: Pedro Earls, MD;  Location: [facility];  Service: General;;    Social History   Social History  . Marital status: Single    Spouse name: N/A  . Number of children: N/A  . Years of education: N/A   Social History Main Topics  . Smoking status: Never Smoker  . Smokeless tobacco: Never Used  . Alcohol use No  . Drug use: No  . Sexual activity: Not Asked   Other Topics Concern  . None   Social History Narrative  . None    Family History  Problem Relation Age of Onset  . Heart disease Mother 70  . Heart attack Mother   . Asthma Father   . Cancer Father     pancreastic cancer  . Heart attack Brother 36  . Cancer Sister     unsure        Review of Systems  Constitutional: Positive for fatigue. Negative for fever, malaise/fatigue and weight loss.  HENT: Negative for  congestion and sore throat.   Eyes: Negative for pain.       Negative for visual changes  Respiratory: Positive for shortness of breath. Negative for cough, sputum production and wheezing.   Cardiovascular: Negative for chest pain, palpitations and leg swelling.  Gastrointestinal: Negative for blood in stool, constipation, diarrhea, heartburn, nausea and vomiting.  Genitourinary: Negative for dysuria, frequency, urgency and vaginal discharge.  Musculoskeletal: Positive for myalgias. Negative for falls, joint pain and joint swelling.  Skin: Negative for rash.  Neurological: Negative for dizziness, sensory change, weakness and headaches.  Endo/Heme/Allergies: Does not bruise/bleed easily.  Psychiatric/Behavioral: Negative for depression, substance abuse and suicidal ideas. The patient is not nervous/anxious.     Objective:   Vitals:   02/17/17 1135  BP: 98/60  Pulse: 69  Temp: 98 F (36.7 C)    Body mass index is 41.77 kg/m.   Physical Examination:  Physical Exam  Constitutional: She is  oriented to person, place, and time and well-developed, well-nourished, and in no distress. No distress.  HENT:  Right Ear: External ear normal.  Left Ear: External ear normal.  Nose: Nose normal.  Mouth/Throat: Oropharynx is clear and moist. No oropharyngeal exudate.  Eyes: Conjunctivae and EOM are normal. Pupils are equal, round, and reactive to light. No scleral icterus.  Neck: Normal range of motion. Neck supple. No thyromegaly present.  Cardiovascular: Normal rate, regular rhythm and normal heart sounds.   Pulmonary/Chest: Effort normal and breath sounds normal. She exhibits no tenderness.  Abdominal: Soft. Bowel sounds are normal. She exhibits no distension. There is no tenderness.  Musculoskeletal: Normal range of motion. She exhibits no edema or tenderness.  Lymphadenopathy:    She has no cervical adenopathy.  Neurological: She is alert and oriented to person, place, and time. Gait  normal.  Skin: Skin is warm and dry.  Psychiatric: Affect and judgment normal.    ASSESSMENT and PLAN:  Yerania was seen today for establish care.  Diagnoses and all orders for this visit:  Hyperglycemia -     Hemoglobin A1c; Future -     Split night study; Future  Acute systolic heart failure ([place]) -     Split night study; Future  Fatigue, unspecified type -     TSH; Future -     T4, free -     CK Total (and CKMB); Future -     Hemoglobin A1c; Future -     Split night study; Future -     acetaminophen (TYLENOL) 500 MG tablet; Take 1 tablet (500 mg total) by mouth every 6 (six) hours as needed. -     Coenzyme Q10 (CO Q-10) 50 MG CAPS; Take 1 capsule by mouth 2 (two) times daily after a meal.  Myalgia -     TSH; Future -     T4, free -     CK Total (and CKMB); Future -     Hemoglobin A1c; Future -     Split night study; Future -     acetaminophen (TYLENOL) 500 MG tablet; Take 1 tablet (500 mg total) by mouth every 6 (six) hours as needed. -     Coenzyme Q10 (CO Q-10) 50 MG CAPS; Take 1 capsule by mouth 2 (two) times daily after a meal.  Daytime somnolence -     Split night study; Future   No problem-specific Assessment & Plan notes found for this encounter.    Recent Results (from the past 2160 hour(s))  CBC     Status: None   Collection Time: 01/04/17 10:49 AM  Result Value Ref Range   WBC 8.8 4.0 - 10.5 K/uL   RBC 4.78 3.87 - 5.11 MIL/uL   Hemoglobin 14.5 12.0 - 15.0 g/dL   HCT 45.3 36.0 - 46.0 %   MCV 94.8 78.0 - 100.0 fL   MCH 30.3 26.0 - 34.0 pg   MCHC 32.0 30.0 - 36.0 g/dL   RDW 13.6 11.5 - 15.5 %   Platelets 241 150 - 400 K/uL  Basic metabolic panel     Status: Abnormal   Collection Time: 01/04/17 10:49 AM  Result Value Ref Range   Sodium 140 135 - 145 mmol/L   Potassium 4.0 3.5 - 5.1 mmol/L   Chloride 99 (L) 101 - 111 mmol/L   CO2 36 (H) 22 - 32 mmol/L   Glucose, Bld 110 (H) 65 - 99 mg/dL   BUN 10 6 - 20 mg/dL  Creatinine, Ser 0.97 0.44 - 1.00  mg/dL   Calcium 8.8 (L) 8.9 - 10.3 mg/dL   GFR calc non Af Amer 60 (L) >60 mL/min   GFR calc Af Amer >60 >60 mL/min    Comment: (NOTE) The eGFR has been calculated using the CKD EPI equation. This calculation has not been validated in all clinical situations. eGFR's persistently <60 mL/min signify possible Chronic Kidney Disease.    Anion gap 5 5 - 15  Brain natriuretic peptide     Status: Abnormal   Collection Time: 01/04/17 10:49 AM  Result Value Ref Range   B Natriuretic Peptide 233.0 (H) 0.0 - 100.0 pg/mL  Troponin I     Status: None   Collection Time: 01/04/17 10:49 AM  Result Value Ref Range   Troponin I <0.03 <0.03 ng/mL  D-dimer, quantitative (not at [facility])     Status: None   Collection Time: 01/04/17 11:19 AM  Result Value Ref Range   D-Dimer, Quant 0.30 0.00 - 0.50 ug/mL-FEU    Comment: (NOTE) At the manufacturer cut-off of 0.50 ug/mL FEU, this assay has been documented to exclude PE with a sensitivity and negative predictive value of 97 to 99%.  At this time, this assay has not been approved by the FDA to exclude DVT/VTE. Results should be correlated with clinical presentation.   Hemoglobin A1c     Status: Abnormal   Collection Time: 01/05/17  4:35 AM  Result Value Ref Range   Hgb A1c MFr Bld 6.1 (H) 4.8 - 5.6 %    Comment: (NOTE)         Pre-diabetes: 5.7 - 6.4         Diabetes: >6.4         Glycemic control for adults with diabetes: <7.0    Mean Plasma Glucose 128 mg/dL    Comment: (NOTE) Performed At: [facility] [place], [place] [id number] Lindon Romp MD [id number]   Basic metabolic panel     Status: Abnormal   Collection Time: 01/05/17  4:38 AM  Result Value Ref Range   Sodium 139 135 - 145 mmol/L   Potassium 4.4 3.5 - 5.1 mmol/L   Chloride 99 (L) 101 - 111 mmol/L   CO2 33 (H) 22 - 32 mmol/L   Glucose, Bld 152 (H) 65 - 99 mg/dL   BUN 11 6 - 20 mg/dL   Creatinine, Ser 0.85 0.44 - 1.00 mg/dL   Calcium 8.6 (L) 8.9 -  10.3 mg/dL   GFR calc non Af Amer >60 >60 mL/min   GFR calc Af Amer >60 >60 mL/min    Comment: (NOTE) The eGFR has been calculated using the CKD EPI equation. This calculation has not been validated in all clinical situations. eGFR's persistently <60 mL/min signify possible Chronic Kidney Disease.    Anion gap 7 5 - 15  CBC     Status: Abnormal   Collection Time: 01/05/17  4:38 AM  Result Value Ref Range   WBC 13.1 (H) 4.0 - 10.5 K/uL   RBC 4.75 3.87 - 5.11 MIL/uL   Hemoglobin 14.1 12.0 - 15.0 g/dL   HCT 44.9 36.0 - 46.0 %   MCV 94.5 78.0 - 100.0 fL   MCH 29.7 26.0 - 34.0 pg   MCHC 31.4 30.0 - 36.0 g/dL   RDW 13.7 11.5 - 15.5 %   Platelets 243 150 - 400 K/uL  Brain natriuretic peptide     Status: Abnormal   Collection Time:  01/05/17  4:38 AM  Result Value Ref Range   B Natriuretic Peptide 259.0 (H) 0.0 - 100.0 pg/mL  Basic metabolic panel     Status: Abnormal   Collection Time: 01/06/17  6:03 AM  Result Value Ref Range   Sodium 141 135 - 145 mmol/L   Potassium 4.5 3.5 - 5.1 mmol/L   Chloride 97 (L) 101 - 111 mmol/L   CO2 38 (H) 22 - 32 mmol/L   Glucose, Bld 104 (H) 65 - 99 mg/dL   BUN 20 6 - 20 mg/dL   Creatinine, Ser 1.02 (H) 0.44 - 1.00 mg/dL   Calcium 8.3 (L) 8.9 - 10.3 mg/dL   GFR calc non Af Amer 56 (L) >60 mL/min   GFR calc Af Amer >60 >60 mL/min    Comment: (NOTE) The eGFR has been calculated using the CKD EPI equation. This calculation has not been validated in all clinical situations. eGFR's persistently <60 mL/min signify possible Chronic Kidney Disease.    Anion gap 6 5 - 15  Magnesium     Status: None   Collection Time: 01/06/17  6:03 AM  Result Value Ref Range   Magnesium 2.2 1.7 - 2.4 mg/dL  ECHOCARDIOGRAM COMPLETE     Status: Abnormal   Collection Time: 01/06/17 10:07 AM  Result Value Ref Range   Weight 4,075.2 oz   Height 65 in   BP 99/57 mmHg   LV PW d 15.3 (A) 0.6 - 1.1 mm   FS 17 (A) 28 - 44 %   LA vol 82.5 mL   LA ID, A-P, ES 43 mm    IVS/LV PW RATIO, ED .97    Stroke v 51 ml   LVOT VTI 23 cm   LA diam index 1.82 cm/m2   LA vol A4C 77.7 ml   LVOT peak grad rest 6 mmHg   E decel time 183 msec   LVOT diameter 22 mm   LVOT area 3.80 cm2   LVOT peak vel 121 cm/s   LVOT SV 87.00 mL   Peak grad 8 mmHg   MV pk E vel 139 m/s   LV sys vol 77 (A) 14 - 42 mL   LV sys vol index 33.0 mL/m2   LV dias vol 128 (A) 46 - 106 mL   LV dias vol index 54.0 mL/m2   LA vol index 34.9 mL/m2   MV Dec 183    LA diam end sys 43.00 mm   Simpson's disk 40.00    TAPSE 24.90 mm  Basic metabolic panel     Status: Abnormal   Collection Time: 01/07/17  6:07 AM  Result Value Ref Range   Sodium 140 135 - 145 mmol/L   Potassium 3.9 3.5 - 5.1 mmol/L   Chloride 96 (L) 101 - 111 mmol/L   CO2 38 (H) 22 - 32 mmol/L   Glucose, Bld 92 65 - 99 mg/dL   BUN 18 6 - 20 mg/dL   Creatinine, Ser 0.80 0.44 - 1.00 mg/dL   Calcium 8.1 (L) 8.9 - 10.3 mg/dL   GFR calc non Af Amer >60 >60 mL/min   GFR calc Af Amer >60 >60 mL/min    Comment: (NOTE) The eGFR has been calculated using the CKD EPI equation. This calculation has not been validated in all clinical situations. eGFR's persistently <60 mL/min signify possible Chronic Kidney Disease.    Anion gap 6 5 - 15  Magnesium     Status: None   Collection  Time: 01/07/17  6:07 AM  Result Value Ref Range   Magnesium 1.9 1.7 - 2.4 mg/dL  Basic metabolic panel     Status: Abnormal   Collection Time: 01/08/17  6:02 AM  Result Value Ref Range   Sodium 140 135 - 145 mmol/L   Potassium 3.2 (L) 3.5 - 5.1 mmol/L    Comment: DELTA CHECK NOTED   Chloride 92 (L) 101 - 111 mmol/L   CO2 40 (H) 22 - 32 mmol/L   Glucose, Bld 88 65 - 99 mg/dL   BUN 20 6 - 20 mg/dL   Creatinine, Ser 0.98 0.44 - 1.00 mg/dL   Calcium 8.3 (L) 8.9 - 10.3 mg/dL   GFR calc non Af Amer 59 (L) >60 mL/min   GFR calc Af Amer >60 >60 mL/min    Comment: (NOTE) The eGFR has been calculated using the CKD EPI equation. This calculation has not  been validated in all clinical situations. eGFR's persistently <60 mL/min signify possible Chronic Kidney Disease.    Anion gap 8 5 - 15    Comment: Performed at [facility], [place] [address], [place], [place] [address]  Magnesium     Status: None   Collection Time: 01/08/17  6:02 AM  Result Value Ref Range   Magnesium 2.0 1.7 - 2.4 mg/dL    Comment: Performed at [facility], [place] [address] [place], [place] [address]  Lipid panel     Status: Abnormal   Collection Time: 01/08/17  6:02 AM  Result Value Ref Range   Cholesterol 210 (H) 0 - 200 mg/dL   Triglycerides 118 <150 mg/dL   HDL 57 >40 mg/dL   Total CHOL/HDL Ratio 3.7 RATIO   VLDL 24 0 - 40 mg/dL   LDL Cholesterol 129 (H) 0 - 99 mg/dL    Comment:        Total Cholesterol/HDL:CHD Risk Coronary Heart Disease Risk Table                     Men   Women  1/2 Average Risk   3.4   3.3  Average Risk       5.0   4.4  2 X Average Risk   9.6   7.1  3 X Average Risk  23.4   11.0        Use the calculated Patient Ratio above and the CHD Risk Table to determine the patient's CHD Risk.        ATP III CLASSIFICATION (LDL):  <100     mg/dL   Optimal  100-129  mg/dL   Near or Above                    Optimal  130-159  mg/dL   Borderline  160-189  mg/dL   High  >190     mg/dL   Very High   Comprehensive metabolic panel     Status: Abnormal   Collection Time: 01/09/17  6:04 AM  Result Value Ref Range   Sodium 141 135 - 145 mmol/L   Potassium 4.0 3.5 - 5.1 mmol/L    Comment: DELTA CHECK NOTED NO VISIBLE HEMOLYSIS    Chloride 95 (L) 101 - 111 mmol/L   CO2 39 (H) 22 - 32 mmol/L   Glucose, Bld 108 (H) 65 - 99 mg/dL   BUN 24 (H) 6 - 20 mg/dL   Creatinine, Ser 0.90 0.44 - 1.00 mg/dL   Calcium 8.4 (  L) 8.9 - 10.3 mg/dL   Total Protein 6.7 6.5 - 8.1 g/dL   Albumin 3.5 3.5 - 5.0 g/dL   AST 17 15 - 41 U/L   ALT 25 14 - 54 U/L   Alkaline Phosphatase 39 38 - 126 U/L   Total Bilirubin 1.2 0.3 - 1.2  mg/dL   GFR calc non Af Amer >60 >60 mL/min   GFR calc Af Amer >60 >60 mL/min    Comment: (NOTE) The eGFR has been calculated using the CKD EPI equation. This calculation has not been validated in all clinical situations. eGFR's persistently <60 mL/min signify possible Chronic Kidney Disease.    Anion gap 7 5 - 15  Basic metabolic panel     Status: Abnormal   Collection Time: 01/10/17  4:57 AM  Result Value Ref Range   Sodium 137 135 - 145 mmol/L   Potassium 4.3 3.5 - 5.1 mmol/L   Chloride 96 (L) 101 - 111 mmol/L   CO2 32 22 - 32 mmol/L   Glucose, Bld 90 65 - 99 mg/dL   BUN 25 (H) 6 - 20 mg/dL   Creatinine, Ser 0.93 0.44 - 1.00 mg/dL   Calcium 8.4 (L) 8.9 - 10.3 mg/dL   GFR calc non Af Amer >60 >60 mL/min   GFR calc Af Amer >60 >60 mL/min    Comment: (NOTE) The eGFR has been calculated using the CKD EPI equation. This calculation has not been validated in all clinical situations. eGFR's persistently <60 mL/min signify possible Chronic Kidney Disease.    Anion gap 9 5 - 15  Protime-INR     Status: None   Collection Time: 01/10/17  4:57 AM  Result Value Ref Range   Prothrombin Time 12.8 11.4 - 15.2 seconds   INR 0.96   CBC     Status: Abnormal   Collection Time: 01/10/17  4:57 AM  Result Value Ref Range   WBC 19.3 (H) 4.0 - 10.5 K/uL    Comment: WHITE COUNT CONFIRMED ON SMEAR   RBC 4.84 3.87 - 5.11 MIL/uL   Hemoglobin 14.9 12.0 - 15.0 g/dL   HCT 46.6 (H) 36.0 - 46.0 %   MCV 96.3 78.0 - 100.0 fL   MCH 30.8 26.0 - 34.0 pg   MCHC 32.0 30.0 - 36.0 g/dL   RDW 13.7 11.5 - 15.5 %   Platelets 254 150 - 400 K/uL    Comment: SPECIMEN CHECKED FOR CLOTS PLATELET COUNT CONFIRMED BY SMEAR   I-STAT 3, arterial blood gas (G3+)     Status: Abnormal   Collection Time: 01/10/17  9:50 AM  Result Value Ref Range   pH, Arterial 7.349 (L) 7.350 - 7.450   pCO2 arterial 68.5 (HH) 32.0 - 48.0 mmHg   pO2, Arterial 79.0 (L) 83.0 - 108.0 mmHg   Bicarbonate 37.8 (H) 20.0 - 28.0 mmol/L    TCO2 40 0 - 100 mmol/L   O2 Saturation 94.0 %   Acid-Base Excess 9.0 (H) 0.0 - 2.0 mmol/L   Patient temperature HIDE    Sample type ARTERIAL    Comment NOTIFIED PHYSICIAN   I-STAT 3, venous blood gas (G3P V)     Status: Abnormal   Collection Time: 01/10/17  9:56 AM  Result Value Ref Range   pH, Ven 7.327 7.250 - 7.430   pCO2, Ven 75.0 (HH) 44.0 - 60.0 mmHg   pO2, Ven 33.0 32.0 - 45.0 mmHg   Bicarbonate 39.3 (H) 20.0 - 28.0 mmol/L   TCO2 42  0 - 100 mmol/L   O2 Saturation 56.0 %   Acid-Base Excess 10.0 (H) 0.0 - 2.0 mmol/L   Patient temperature HIDE    Sample type VENOUS    Comment NOTIFIED PHYSICIAN   POCT Activated clotting time     Status: None   Collection Time: 01/10/17 11:17 AM  Result Value Ref Range   Activated Clotting Time 169 seconds  Troponin I     Status: Abnormal   Collection Time: 01/10/17  4:42 PM  Result Value Ref Range   Troponin I 0.04 (HH) <0.03 ng/mL    Comment: CRITICAL RESULT CALLED TO, READ BACK BY AND VERIFIED WITH: Gordy Clement RN [phone] [id number] GREEN R   MRSA PCR Screening     Status: None   Collection Time: 01/10/17  4:52 PM  Result Value Ref Range   MRSA by PCR NEGATIVE NEGATIVE    Comment:        The GeneXpert MRSA Assay (FDA approved for NASAL specimens only), is one component of a comprehensive MRSA colonization surveillance program. It is not intended to diagnose MRSA infection nor to guide or monitor treatment for MRSA infections.   Heparin level (unfractionated)     Status: Abnormal   Collection Time: 01/10/17  7:28 PM  Result Value Ref Range   Heparin Unfractionated <0.10 (L) 0.30 - 0.70 IU/mL    Comment:        IF HEPARIN RESULTS ARE BELOW EXPECTED VALUES, AND PATIENT DOSAGE HAS BEEN CONFIRMED, SUGGEST FOLLOW UP TESTING OF ANTITHROMBIN III LEVELS. REPEATED TO VERIFY   Troponin I     Status: Abnormal   Collection Time: 01/10/17 10:17 PM  Result Value Ref Range   Troponin I 0.47 (HH) <0.03 ng/mL    Comment: CRITICAL RESULT CALLED  TO, READ BACK BY AND VERIFIED WITH: GONTHIER [facility] 01/10/17 2314 WAYK   Basic metabolic panel     Status: Abnormal   Collection Time: 01/11/17  2:34 AM  Result Value Ref Range   Sodium 141 135 - 145 mmol/L   Potassium 4.9 3.5 - 5.1 mmol/L   Chloride 95 (L) 101 - 111 mmol/L   CO2 36 (H) 22 - 32 mmol/L   Glucose, Bld 95 65 - 99 mg/dL   BUN 20 6 - 20 mg/dL   Creatinine, Ser 1.07 (H) 0.44 - 1.00 mg/dL   Calcium 8.9 8.9 - 10.3 mg/dL   GFR calc non Af Amer 53 (L) >60 mL/min   GFR calc Af Amer >60 >60 mL/min    Comment: (NOTE) The eGFR has been calculated using the CKD EPI equation. This calculation has not been validated in all clinical situations. eGFR's persistently <60 mL/min signify possible Chronic Kidney Disease.    Anion gap 10 5 - 15  Magnesium     Status: None   Collection Time: 01/11/17  2:34 AM  Result Value Ref Range   Magnesium 2.1 1.7 - 2.4 mg/dL  CBC     Status: Abnormal   Collection Time: 01/11/17  2:34 AM  Result Value Ref Range   WBC 16.1 (H) 4.0 - 10.5 K/uL   RBC 4.91 3.87 - 5.11 MIL/uL   Hemoglobin 14.9 12.0 - 15.0 g/dL   HCT 47.3 (H) 36.0 - 46.0 %   MCV 96.3 78.0 - 100.0 fL   MCH 30.3 26.0 - 34.0 pg   MCHC 31.5 30.0 - 36.0 g/dL   RDW 14.0 11.5 - 15.5 %   Platelets 236 150 - 400 K/uL  Heparin level (  unfractionated)     Status: Abnormal   Collection Time: 01/11/17  2:34 AM  Result Value Ref Range   Heparin Unfractionated <0.10 (L) 0.30 - 0.70 IU/mL    Comment:        IF HEPARIN RESULTS ARE BELOW EXPECTED VALUES, AND PATIENT DOSAGE HAS BEEN CONFIRMED, SUGGEST FOLLOW UP TESTING OF ANTITHROMBIN III LEVELS.   Troponin I (q 6hr x 3)     Status: Abnormal   Collection Time: 01/11/17  8:41 AM  Result Value Ref Range   Troponin I 2.24 (HH) <0.03 ng/mL    Comment: CRITICAL RESULT CALLED TO, READ BACK BY AND VERIFIED WITH: KRISTY JOHNSON,RN AT 5697 01/11/2017 BY ZBEECH.   Troponin I (q 6hr x 3)     Status: Abnormal   Collection Time: 01/11/17  1:48 PM  Result  Value Ref Range   Troponin I 1.40 (HH) <0.03 ng/mL    Comment: CRITICAL VALUE NOTED.  VALUE IS CONSISTENT WITH PREVIOUSLY REPORTED AND CALLED VALUE.  Heparin level (unfractionated)     Status: Abnormal   Collection Time: 01/11/17  1:48 PM  Result Value Ref Range   Heparin Unfractionated <0.10 (L) 0.30 - 0.70 IU/mL    Comment:        IF HEPARIN RESULTS ARE BELOW EXPECTED VALUES, AND PATIENT DOSAGE HAS BEEN CONFIRMED, SUGGEST FOLLOW UP TESTING OF ANTITHROMBIN III LEVELS.   Troponin I (q 6hr x 3)     Status: Abnormal   Collection Time: 01/11/17  8:40 PM  Result Value Ref Range   Troponin I 1.19 (HH) <0.03 ng/mL    Comment: CRITICAL VALUE NOTED.  VALUE IS CONSISTENT WITH PREVIOUSLY REPORTED AND CALLED VALUE.  Heparin level (unfractionated)     Status: Abnormal   Collection Time: 01/11/17  8:40 PM  Result Value Ref Range   Heparin Unfractionated 0.77 (H) 0.30 - 0.70 IU/mL    Comment:        IF HEPARIN RESULTS ARE BELOW EXPECTED VALUES, AND PATIENT DOSAGE HAS BEEN CONFIRMED, SUGGEST FOLLOW UP TESTING OF ANTITHROMBIN III LEVELS.   CBC     Status: Abnormal   Collection Time: 01/12/17  2:33 AM  Result Value Ref Range   WBC 16.9 (H) 4.0 - 10.5 K/uL   RBC 4.62 3.87 - 5.11 MIL/uL   Hemoglobin 13.9 12.0 - 15.0 g/dL   HCT 43.8 36.0 - 46.0 %   MCV 94.8 78.0 - 100.0 fL   MCH 30.1 26.0 - 34.0 pg   MCHC 31.7 30.0 - 36.0 g/dL   RDW 13.5 11.5 - 15.5 %   Platelets 249 150 - 400 K/uL  Heparin level (unfractionated)     Status: Abnormal   Collection Time: 01/12/17  2:33 AM  Result Value Ref Range   Heparin Unfractionated 1.04 (H) 0.30 - 0.70 IU/mL    Comment:        IF HEPARIN RESULTS ARE BELOW EXPECTED VALUES, AND PATIENT DOSAGE HAS BEEN CONFIRMED, SUGGEST FOLLOW UP TESTING OF ANTITHROMBIN III LEVELS.   Basic metabolic panel     Status: Abnormal   Collection Time: 01/12/17  2:33 AM  Result Value Ref Range   Sodium 139 135 - 145 mmol/L   Potassium 4.5 3.5 - 5.1 mmol/L    Comment:  SPECIMEN HEMOLYZED. HEMOLYSIS MAY AFFECT INTEGRITY OF RESULTS.   Chloride 93 (L) 101 - 111 mmol/L   CO2 36 (H) 22 - 32 mmol/L   Glucose, Bld 115 (H) 65 - 99 mg/dL   BUN 20 6 -  20 mg/dL   Creatinine, Ser 1.08 (H) 0.44 - 1.00 mg/dL   Calcium 8.7 (L) 8.9 - 10.3 mg/dL   GFR calc non Af Amer 53 (L) >60 mL/min   GFR calc Af Amer >60 >60 mL/min    Comment: (NOTE) The eGFR has been calculated using the CKD EPI equation. This calculation has not been validated in all clinical situations. eGFR's persistently <60 mL/min signify possible Chronic Kidney Disease.    Anion gap 10 5 - 15  Magnesium     Status: None   Collection Time: 01/12/17  2:33 AM  Result Value Ref Range   Magnesium 2.1 1.7 - 2.4 mg/dL  Troponin I     Status: Abnormal   Collection Time: 01/12/17  8:51 AM  Result Value Ref Range   Troponin I 1.33 (HH) <0.03 ng/mL    Comment: CRITICAL VALUE NOTED.  VALUE IS CONSISTENT WITH PREVIOUSLY REPORTED AND CALLED VALUE.  CBC     Status: Abnormal   Collection Time: 01/13/17  5:56 AM  Result Value Ref Range   WBC 14.2 (H) 4.0 - 10.5 K/uL   RBC 4.70 3.87 - 5.11 MIL/uL   Hemoglobin 14.0 12.0 - 15.0 g/dL   HCT 43.8 36.0 - 46.0 %   MCV 93.2 78.0 - 100.0 fL   MCH 29.8 26.0 - 34.0 pg   MCHC 32.0 30.0 - 36.0 g/dL   RDW 13.4 11.5 - 15.5 %   Platelets 236 150 - 400 K/uL  Basic metabolic panel     Status: Abnormal   Collection Time: 01/13/17  5:56 AM  Result Value Ref Range   Sodium 140 135 - 145 mmol/L   Potassium 4.1 3.5 - 5.1 mmol/L   Chloride 96 (L) 101 - 111 mmol/L   CO2 34 (H) 22 - 32 mmol/L   Glucose, Bld 89 65 - 99 mg/dL   BUN 17 6 - 20 mg/dL   Creatinine, Ser 0.92 0.44 - 1.00 mg/dL   Calcium 8.7 (L) 8.9 - 10.3 mg/dL   GFR calc non Af Amer >60 >60 mL/min   GFR calc Af Amer >60 >60 mL/min    Comment: (NOTE) The eGFR has been calculated using the CKD EPI equation. This calculation has not been validated in all clinical situations. eGFR's persistently <60 mL/min signify  possible Chronic Kidney Disease.    Anion gap 10 5 - 15  Troponin I     Status: Abnormal   Collection Time: 01/13/17  5:56 AM  Result Value Ref Range   Troponin I 1.20 (HH) <0.03 ng/mL    Comment: CRITICAL VALUE NOTED.  VALUE IS CONSISTENT WITH PREVIOUSLY REPORTED AND CALLED VALUE.  Pro b natriuretic peptide     Status: Abnormal   Collection Time: 01/20/17 12:27 PM  Result Value Ref Range   NT-Pro BNP 344 (H) 0 - 301 pg/mL    Comment: The following cut-points have been suggested for the use of proBNP for the diagnostic evaluation of heart failure (HF) in patients with acute dyspnea: Modality                     Age           Optimal Cut                            (years)            Point ------------------------------------------------------ Diagnosis (rule in HF)        <  50            450 pg/mL                           50 - 75            900 pg/mL                               >75           1800 pg/mL Exclusion (rule out HF)  Age independent     300 pg/mL   Basic Metabolic Panel (BMET)     Status: Abnormal   Collection Time: 01/20/17 12:27 PM  Result Value Ref Range   Glucose 94 65 - 99 mg/dL   BUN 17 8 - 27 mg/dL   Creatinine, Ser 1.22 (H) 0.57 - 1.00 mg/dL   GFR calc non Af Amer 47 (L) >59 mL/min/1.73   GFR calc Af Amer 54 (L) >59 mL/min/1.73   BUN/Creatinine Ratio 14 12 - 28   Sodium 142 134 - 144 mmol/L   Potassium 4.2 3.5 - 5.2 mmol/L   Chloride 98 96 - 106 mmol/L   CO2 27 18 - 29 mmol/L   Calcium 8.6 (L) 8.7 - 10.3 mg/dL  CBC     Status: Abnormal   Collection Time: 01/20/17 12:27 PM  Result Value Ref Range   WBC 11.7 (H) 3.4 - 10.8 x10E3/uL   RBC 4.77 3.77 - 5.28 x10E6/uL   Hemoglobin 14.4 11.1 - 15.9 g/dL   Hematocrit 42.9 34.0 - 46.6 %   MCV 90 79 - 97 fL   MCH 30.2 26.6 - 33.0 pg   MCHC 33.6 31.5 - 35.7 g/dL   RDW 14.2 12.3 - 15.4 %   Platelets 277 150 - 379 x10E3/uL  TSH     Status: None   Collection Time: 02/17/17 12:18 PM  Result Value Ref Range    TSH 4.35 0.35 - 4.50 uIU/mL  Hemoglobin A1c     Status: Abnormal   Collection Time: 02/17/17 12:18 PM  Result Value Ref Range   Hgb A1c MFr Bld 6.7 (H) 4.6 - 6.5 %    Comment: Glycemic Control Guidelines for People with Diabetes:Non Diabetic:  <6%Goal of Therapy: <7%Additional Action Suggested:  >8%   T4, free     Status: None   Collection Time: 02/17/17 12:18 PM  Result Value Ref Range   Free T4 0.83 0.60 - 1.60 ng/dL    Comment: Specimens from patients who are undergoing biotin therapy and /or ingesting biotin supplements may contain high levels of biotin.  The higher biotin concentration in these specimens interferes with this Free T4 assay.  Specimens that contain high levels  of biotin may cause false high results for this Free T4 assay.  Please interpret results in light of the total clinical presentation of the patient.     Follow up: Return in about 1 month (around 03/20/2017) for fatigue and myalgia.  Wilfred Lacy, NP

## 2017-02-17 NOTE — Patient Instructions (Signed)
Go to basement for blood draw 

## 2017-02-17 NOTE — Progress Notes (Signed)
Pre visit review using our clinic review tool, if applicable. No additional management support is needed unless otherwise documented below in the visit note. 

## 2017-02-18 LAB — CK TOTAL AND CKMB (NOT AT ARMC)
CK, MB: 1.7 ng/mL (ref 0.0–5.0)
Relative Index: 5 — ABNORMAL HIGH (ref 0.0–4.0)
Total CK: 34 U/L (ref 7–177)

## 2017-03-08 ENCOUNTER — Ambulatory Visit (INDEPENDENT_AMBULATORY_CARE_PROVIDER_SITE_OTHER): Payer: Medicare Other | Admitting: Cardiology

## 2017-03-08 ENCOUNTER — Encounter: Payer: Self-pay | Admitting: Cardiology

## 2017-03-08 VITALS — BP 102/64 | HR 73 | Ht 65.0 in | Wt 254.0 lb

## 2017-03-08 DIAGNOSIS — G4730 Sleep apnea, unspecified: Secondary | ICD-10-CM | POA: Diagnosis not present

## 2017-03-08 DIAGNOSIS — I5022 Chronic systolic (congestive) heart failure: Secondary | ICD-10-CM

## 2017-03-08 DIAGNOSIS — E782 Mixed hyperlipidemia: Secondary | ICD-10-CM

## 2017-03-08 DIAGNOSIS — I4891 Unspecified atrial fibrillation: Secondary | ICD-10-CM

## 2017-03-08 DIAGNOSIS — I2510 Atherosclerotic heart disease of native coronary artery without angina pectoris: Secondary | ICD-10-CM

## 2017-03-08 DIAGNOSIS — R0602 Shortness of breath: Secondary | ICD-10-CM | POA: Diagnosis not present

## 2017-03-08 MED ORDER — ATORVASTATIN CALCIUM 40 MG PO TABS: 40.0000 mg | ORAL_TABLET | Freq: Every day | ORAL | 1 refills | Status: DC

## 2017-03-08 NOTE — Patient Instructions (Signed)
Your physician recommends that you schedule a follow-up appointment in: [place] has recommended you make the following change in your medication:   DECREASE ATORVASTATIN 40 MG DAILY  You have been referred to [facility] physician has recommended that you have a pulmonary function test. Pulmonary Function Tests are a group of tests that measure how well air moves in and out of your lungs.  Thank you for choosing [facility]!!

## 2017-03-08 NOTE — Progress Notes (Signed)
Clinical Summary Pamela Soto is a 67 y.o.female last seen by PA Pamela Soto, this is our first visit together.  1. NICM/Chronic systolic HF/NSTEMI - admit Jan 2018 with CHF - echo at that time showed LVEF 35-40%, a new finding for the patient.  - referred for cath. Feb 2018 cath: distal LA 100%, Prox RCA 40%, OM3 40%. RHC mean PA 37, PCWP 26, CI 1.73. CAD medically managed.   - breathing is improving, still with some SOB. No recent edema. Mild chest pressure at times, at rest or with exertion. Lasts a few minutes, occurs 2-3 times a week. - checks weights every other day, stable around 254 lbs. - limiting sodium intake. Avoiding NSAIDs.    2. COPD - she does remember PFTs - has nebulizer machine at home with prn albutero - significant second hand smoke. Can have wheezing at times.    3. Afib - no recent palpitations - no bleeding troubles on eliquis    4. Hyperlipidemia - reports nonspecific neck and shoulder pains, unclear if related to statin.     5. DM2 - followed by pcp - HgbA1c last month was 6.7   Past Medical History:  Diagnosis Date  . Acid reflux   . Hypertension      No Known Allergies   Current Outpatient Prescriptions  Medication Sig Dispense Refill  . acetaminophen (TYLENOL) 500 MG tablet Take 1 tablet (500 mg total) by mouth every 6 (six) hours as needed. 30 tablet 0  . albuterol (PROVENTIL) (2.5 MG/3ML) 0.083% nebulizer solution Take 3 mLs (2.5 mg total) by nebulization every 6 (six) hours as needed for wheezing or shortness of breath. 75 mL 12  . apixaban (ELIQUIS) 5 MG TABS tablet Take 1 tablet (5 mg total) by mouth 2 (two) times daily. 60 tablet 0  . atorvastatin (LIPITOR) 80 MG tablet Take 1 tablet (80 mg total) by mouth daily at 6 PM. 30 tablet 0  . bisoprolol (ZEBETA) 5 MG tablet Take 0.5 tablets (2.5 mg total) by mouth daily. 30 tablet 0  . clopidogrel (PLAVIX) 75 MG tablet Take 1 tablet (75 mg total) by mouth daily. 30 tablet 0  . Coenzyme  Q10 (CO Q-10) 50 MG CAPS Take 1 capsule by mouth 2 (two) times daily after a meal. 60 each 0  . furosemide (LASIX) 40 MG tablet Take 1 tablet (40 mg total) by mouth daily. 30 tablet 0  . lisinopril (PRINIVIL,ZESTRIL) 5 MG tablet Take 1 tablet (5 mg total) by mouth daily. 90 tablet 3  . potassium chloride SA (K-DUR,KLOR-CON) 20 MEQ tablet Take 1 tablet (20 mEq total) by mouth daily. 30 tablet 0   No current facility-administered medications for this visit.      Past Surgical History:  Procedure Laterality Date  . BREAST BIOPSY Right    normal result  . CESAREAN SECTION    . CHOLECYSTECTOMY  11/06/2012   Procedure: LAPAROSCOPIC CHOLECYSTECTOMY WITH INTRAOPERATIVE CHOLANGIOGRAM;  Surgeon: Pamela Earls, MD;  Location: [facility];  Service: General;  Laterality: N/A;  . RIGHT/LEFT HEART CATH AND CORONARY ANGIOGRAPHY N/A 01/10/2017   Procedure: Right/Left Heart Cath and Coronary Angiography;  Surgeon: Pamela Crome, MD;  Location: [facility];  Service: Cardiovascular;  Laterality: N/A;  . UMBILICAL HERNIA REPAIR  11/06/2012   Procedure: HERNIA REPAIR UMBILICAL ADULT;  Surgeon: Pamela Earls, MD;  Location: [facility];  Service: General;;     No Known Allergies    Family History  Problem  Relation Age of Onset  . Heart disease Mother 50  . Heart attack Mother   . Asthma Father   . Cancer Father     pancreastic cancer  . Heart attack Brother 104  . Cancer Sister     unsure     Social History Pamela Soto reports that she has never smoked. She has never used smokeless tobacco. Pamela Soto reports that she does not drink alcohol.   Review of Systems CONSTITUTIONAL: No weight loss, fever, chills, weakness or fatigue.  HEENT: Eyes: No visual loss, blurred vision, double vision or yellow sclerae.No hearing loss, sneezing, congestion, runny nose or sore throat.  SKIN: No rash or itching.  CARDIOVASCULAR: per HPI RESPIRATORY: No shortness of breath, cough or sputum.    GASTROINTESTINAL: No anorexia, nausea, vomiting or diarrhea. No abdominal pain or blood.  GENITOURINARY: No burning on urination, no polyuria NEUROLOGICAL: No headache, dizziness, syncope, paralysis, ataxia, numbness or tingling in the extremities. No change in bowel or bladder control.  MUSCULOSKELETAL: No muscle, back pain, joint pain or stiffness.  LYMPHATICS: No enlarged nodes. No history of splenectomy.  PSYCHIATRIC: No history of depression or anxiety.  ENDOCRINOLOGIC: No reports of sweating, cold or heat intolerance. No polyuria or polydipsia.  [name]   Physical Examination Vitals:   03/08/17 1053  BP: 102/64  Pulse: 73   Vitals:   03/08/17 1053  Weight: 254 lb (115.2 kg)  Height: 5\' 5"  (1.651 m)    Gen: resting comfortably, no acute distress HEENT: no scleral icterus, pupils equal round and reactive, no palptable cervical adenopathy,  CV: RRR, no m/r/g, no jvd Resp: Clear to auscultation bilaterally GI: abdomen is soft, non-tender, non-distended, normal bowel sounds, no hepatosplenomegaly MSK: extremities are warm, no edema.  Skin: warm, no rash Neuro:  no focal deficits Psych: appropriate affect   Diagnostic Studies 01/2017 cath  Dist LAD lesion, 100 %stenosed.  Prox RCA lesion, 40 %stenosed.  3rd Mrg lesion, 40 %stenosed.  There is severe left ventricular systolic dysfunction.  The left ventricular ejection fraction is less than 25% by visual estimate.  LV end diastolic pressure is severely elevated.  Hemodynamic findings consistent with moderate pulmonary hypertension.    Acute atrial fibrillation developed on the cath table during right heart/right atrial pressure recording. Rate 118 bpm. Atrial fibrillation was treated with one bolus of IV amiodarone.  40% proximal RCA stenosis.  Tortuous but normal circumflex  100% distal/apical LAD stenosis. This lesion is probably chronic but difficult to determine age. There is also a possibility that it is acute  embolic occlusion from a catheter-based thrombus. For that reason additional heparin and then Aggrastat bolus was given as the patient complained of chest pressure post procedure.  Chest pressure post procedure is similar to prior episodes.  Severe LV dysfunction with EF less than 25%. Contraction pattern appears global.  Arterial blood gas demonstrated severe CO2 retention, pCO2 67.    01/2017 echo Study Conclusions  - Left ventricle: The cavity size was mildly dilated. Wall   thickness was increased in a pattern of moderate LVH. Systolic   function was moderately reduced. The estimated ejection fraction   was in the range of 35% to 40%. Diffuse hypokinesis. - Aortic valve: Mildly calcified annulus. Trileaflet; mildly   thickened leaflets. - Mitral valve: There was mild regurgitation. - Left atrium: The atrium was moderately dilated. - Atrial septum: The septum bowed from left to right, consistent   with increased left atrial pressure. - Pulmonic valve: There was  mild regurgitation.  Assessment and Plan  1. NICM/Chronic systolic HF/NSTEMI - SOB improving. She will continue current meds. Soft bp's today, no further med titration.  - refer to cardiac rehab. With her obesity and heart failure, we will refer for OSA evaluation  2. Afib - no recent symptoms. Continue current meds. CHADS2Vasc score is 5, continue eliquis  3. Hyperlipidemia - nonspecific muscle aches, we will try lowering atorva to 40mg  daily  4. DM2 - at goal, continue dietary modification and f/u with pcp  5. Morbid obesity - counseled on dietary changes and weight loss, referred to cardiac rehab.    Arnoldo Lenis, M.D.

## 2017-03-09 ENCOUNTER — Ambulatory Visit (HOSPITAL_COMMUNITY)
Admission: RE | Admit: 2017-03-09 | Discharge: 2017-03-09 | Disposition: A | Discharge status: Home / Self Care | Payer: Medicare Other | Source: Ambulatory Visit | Attending: Cardiology | Admitting: Cardiology

## 2017-03-09 DIAGNOSIS — R0602 Shortness of breath: Secondary | ICD-10-CM

## 2017-03-09 DIAGNOSIS — J449 Chronic obstructive pulmonary disease, unspecified: Secondary | ICD-10-CM | POA: Insufficient documentation

## 2017-03-09 DIAGNOSIS — J988 Other specified respiratory disorders: Secondary | ICD-10-CM | POA: Diagnosis not present

## 2017-03-09 LAB — PULMONARY FUNCTION TEST
DL/VA % pred: 127 %
DL/VA: 6.37 ml/min/mmHg/L
DLCO COR % PRED: 67 %
DLCO UNC: 17.75 ml/min/mmHg
DLCO cor: 17.75 ml/min/mmHg
DLCO unc % pred: 67 %
FEF 25-75 POST: 1.62 L/s
FEF 25-75 Pre: 0.84 L/sec
FEF2575-%Change-Post: 93 %
FEF2575-%Pred-Post: 81 %
FEF2575-%Pred-Pre: 42 %
FEV1-%CHANGE-POST: 18 %
FEV1-%PRED-POST: 62 %
FEV1-%PRED-PRE: 53 %
FEV1-POST: 1.32 L
FEV1-PRE: 1.12 L
FEV1FVC-%CHANGE-POST: 0 %
FEV1FVC-%PRED-PRE: 97 %
FEV6-%Change-Post: 17 %
FEV6-%PRED-PRE: 56 %
FEV6-%Pred-Post: 66 %
FEV6-Post: 1.73 L
FEV6-Pre: 1.48 L
FEV6FVC-%PRED-POST: 103 %
FEV6FVC-%PRED-PRE: 103 %
FVC-%Change-Post: 17 %
FVC-%PRED-PRE: 55 %
FVC-%Pred-Post: 64 %
FVC-PRE: 1.48 L
FVC-Post: 1.73 L
POST FEV6/FVC RATIO: 100 %
PRE FEV1/FVC RATIO: 76 %
Post FEV1/FVC ratio: 76 %
Pre FEV6/FVC Ratio: 100 %
RV % pred: 108 %
RV: 2.35 L
TLC % PRED: 75 %
TLC: 4 L

## 2017-03-09 MED ORDER — ALBUTEROL SULFATE (2.5 MG/3ML) 0.083% IN NEBU
2.5000 mg | INHALATION_SOLUTION | Freq: Once | RESPIRATORY_TRACT | Status: AC
  Administered 2017-03-09: 2.5 mg via RESPIRATORY_TRACT

## 2017-03-14 ENCOUNTER — Other Ambulatory Visit: Payer: Self-pay | Admitting: Cardiology

## 2017-03-16 ENCOUNTER — Telehealth: Payer: Self-pay | Admitting: *Deleted

## 2017-03-16 MED ORDER — TIOTROPIUM BROMIDE MONOHYDRATE 18 MCG IN CAPS: 18.0000 ug | ORAL_CAPSULE | Freq: Every day | RESPIRATORY_TRACT | 12 refills | Status: DC

## 2017-03-16 NOTE — Telephone Encounter (Signed)
Pt voiced understanding - spiriva sent to [facility] - routed to pcp

## 2017-03-16 NOTE — Telephone Encounter (Signed)
-----   Message from Arnoldo Lenis, MD sent at 03/15/2017 10:42 AM EDT ----- PFTs confirm severe COPD. She should be on more than just albuterol as an inhaler. Please start spirivia 46mcg daily. Please forward PFTs to her pcp  J BrancH MD

## 2017-03-17 ENCOUNTER — Other Ambulatory Visit: Payer: Self-pay | Admitting: Cardiology

## 2017-03-21 ENCOUNTER — Ambulatory Visit: Payer: Medicare Other | Admitting: Nurse Practitioner

## 2017-03-23 ENCOUNTER — Encounter: Payer: Self-pay | Admitting: Nurse Practitioner

## 2017-03-23 ENCOUNTER — Ambulatory Visit (INDEPENDENT_AMBULATORY_CARE_PROVIDER_SITE_OTHER): Payer: Medicare Other | Admitting: Nurse Practitioner

## 2017-03-23 VITALS — BP 108/70 | HR 69 | Temp 98.2°F | Ht 65.0 in | Wt 253.0 lb

## 2017-03-23 DIAGNOSIS — M791 Myalgia, unspecified site: Secondary | ICD-10-CM

## 2017-03-23 DIAGNOSIS — R5383 Other fatigue: Secondary | ICD-10-CM | POA: Diagnosis not present

## 2017-03-23 DIAGNOSIS — J441 Chronic obstructive pulmonary disease with (acute) exacerbation: Secondary | ICD-10-CM | POA: Diagnosis not present

## 2017-03-23 DIAGNOSIS — R739 Hyperglycemia, unspecified: Secondary | ICD-10-CM | POA: Diagnosis not present

## 2017-03-23 NOTE — Progress Notes (Signed)
Subjective:  Patient ID: Pamela Soto, female    DOB: Apr 05, 1951  Age: 66 y.o. MRN: [id number]  CC: Follow-up (1 mo fu)  HPI  HTN: Stable.  CHF: No PND. No edema. Started cardiac rehab current ongoing cardiopulmonary rehab.  COPD: Persistent SOB with exertion.  Insomnia: Sleep study scheduled for 03/31/2017.  Myalgia: Improved with exercise Did not take co Q 10 as prescribed.  GERD: No signs of GI bleed.  Outpatient Medications Prior to Visit  Medication Sig Dispense Refill  . acetaminophen (TYLENOL) 500 MG tablet Take 1 tablet (500 mg total) by mouth every 6 (six) hours as needed. 30 tablet 0  . albuterol (PROVENTIL) (2.5 MG/3ML) 0.083% nebulizer solution Take 3 mLs (2.5 mg total) by nebulization every 6 (six) hours as needed for wheezing or shortness of breath. 75 mL 12  . atorvastatin (LIPITOR) 40 MG tablet Take 1 tablet (40 mg total) by mouth daily. 90 tablet 1  . bisoprolol (ZEBETA) 5 MG tablet Take 0.5 tablets (2.5 mg total) by mouth daily. 30 tablet 0  . clopidogrel (PLAVIX) 75 MG tablet TAKE ONE TABLET BY MOUTH DAILY. 30 tablet 0  . Coenzyme Q10 (CO Q-10) 50 MG CAPS Take 1 capsule by mouth 2 (two) times daily after a meal. 60 each 0  . ELIQUIS 5 MG TABS tablet TAKE ONE TABLET BY MOUTH TWICE DAILY. 60 tablet 0  . furosemide (LASIX) 40 MG tablet TAKE ONE TABLET BY MOUTH DAILY. 30 tablet 6  . lisinopril (PRINIVIL,ZESTRIL) 5 MG tablet Take 1 tablet (5 mg total) by mouth daily. 90 tablet 3  . potassium chloride SA (K-DUR,KLOR-CON) 20 MEQ tablet TAKE ONE TABLET BY MOUTH DAILY. 30 tablet 0  . tiotropium (SPIRIVA HANDIHALER) 18 MCG inhalation capsule Place 1 capsule (18 mcg total) into inhaler and inhale daily. 30 capsule 12   No facility-administered medications prior to visit.     ROS See HPI  Objective:  BP 108/70   Pulse 69   Temp 98.2 F (36.8 C)   Ht 5\' 5"  (1.651 m)   Wt 253 lb (114.8 kg)   SpO2 95%   BMI 42.10 kg/m   BP Readings from Last 3  Encounters:  03/23/17 108/70  03/08/17 102/64  02/17/17 98/60    Wt Readings from Last 3 Encounters:  03/23/17 253 lb (114.8 kg)  03/08/17 254 lb (115.2 kg)  02/17/17 251 lb (113.9 kg)    Physical Exam  Constitutional: She is oriented to person, place, and time.  Neck: Normal range of motion. Neck supple.  Cardiovascular: Normal rate and regular rhythm.   Murmur heard. Pulmonary/Chest: Effort normal.  Diminished lung sounds in bases  Musculoskeletal: She exhibits no edema.  Neurological: She is alert and oriented to person, place, and time.  Skin: Skin is warm and dry.  Vitals reviewed.   Lab Results  Component Value Date   WBC 11.7 (H) 01/20/2017   HGB 14.0 01/13/2017   HCT 42.9 01/20/2017   PLT 277 01/20/2017   GLUCOSE 94 01/20/2017   CHOL 210 (H) 01/08/2017   TRIG 118 01/08/2017   HDL 57 01/08/2017   LDLCALC 129 (H) 01/08/2017   ALT 25 01/09/2017   AST 17 01/09/2017   NA 142 01/20/2017   K 4.2 01/20/2017   CL 98 01/20/2017   CREATININE 1.22 (H) 01/20/2017   BUN 17 01/20/2017   CO2 27 01/20/2017   TSH 4.35 02/17/2017   INR 0.96 01/10/2017   HGBA1C 6.7 (H) 02/17/2017  No results found.  Assessment & Plan:   Pamela Soto was seen today for follow-up.  Diagnoses and all orders for this visit:  Hyperglycemia -     Ambulatory referral to Ophthalmology  Fatigue, unspecified type  Myalgia  Acute exacerbation of chronic obstructive pulmonary disease (COPD) ([place])   I am having Pamela Soto maintain her bisoprolol, albuterol, lisinopril, acetaminophen, Co Q-10, atorvastatin, furosemide, tiotropium, ELIQUIS, clopidogrel, and potassium chloride SA.  No orders of the defined types were placed in this encounter.   Follow-up: Return in about 6 months (around 09/22/2017) for HTN and CHF, hyperlipidemia, DM.[name]  Wilfred Lacy, NP

## 2017-03-23 NOTE — Progress Notes (Signed)
Pre visit review using our clinic review tool, if applicable. No additional management support is needed unless otherwise documented below in the visit note. 

## 2017-03-23 NOTE — Patient Instructions (Addendum)
If you have medicare related insurance (such as traditional Medicare, [employer], [employer], or similar), Please make an appointment at the scheduling desk with Sharee Pimple, the [employer], for your Wellness visit in this office, which is a benefit with your insurance.  You have an appointment for sleep study 03/31/2017.  No change in medications. Continue follow up with pulmonology and cardiology.

## 2017-03-29 DIAGNOSIS — J449 Chronic obstructive pulmonary disease, unspecified: Secondary | ICD-10-CM | POA: Diagnosis not present

## 2017-03-29 DIAGNOSIS — I4891 Unspecified atrial fibrillation: Secondary | ICD-10-CM | POA: Diagnosis not present

## 2017-03-29 DIAGNOSIS — E669 Obesity, unspecified: Secondary | ICD-10-CM | POA: Diagnosis not present

## 2017-03-29 DIAGNOSIS — G4733 Obstructive sleep apnea (adult) (pediatric): Secondary | ICD-10-CM | POA: Diagnosis not present

## 2017-03-31 ENCOUNTER — Ambulatory Visit: Payer: Medicare Other | Attending: Nurse Practitioner | Admitting: Neurology

## 2017-03-31 DIAGNOSIS — G4710 Hypersomnia, unspecified: Secondary | ICD-10-CM | POA: Insufficient documentation

## 2017-03-31 DIAGNOSIS — R5383 Other fatigue: Secondary | ICD-10-CM | POA: Insufficient documentation

## 2017-03-31 DIAGNOSIS — R400 Somnolence: Secondary | ICD-10-CM

## 2017-03-31 DIAGNOSIS — I5021 Acute systolic (congestive) heart failure: Secondary | ICD-10-CM

## 2017-03-31 DIAGNOSIS — M791 Myalgia, unspecified site: Secondary | ICD-10-CM

## 2017-03-31 DIAGNOSIS — G4733 Obstructive sleep apnea (adult) (pediatric): Secondary | ICD-10-CM | POA: Insufficient documentation

## 2017-03-31 DIAGNOSIS — R739 Hyperglycemia, unspecified: Secondary | ICD-10-CM

## 2017-04-04 ENCOUNTER — Telehealth: Payer: Self-pay | Admitting: Nurse Practitioner

## 2017-04-04 ENCOUNTER — Telehealth: Payer: Self-pay | Admitting: Cardiology

## 2017-04-04 ENCOUNTER — Ambulatory Visit: Payer: Self-pay | Admitting: Nurse Practitioner

## 2017-04-04 DIAGNOSIS — G4730 Sleep apnea, unspecified: Secondary | ICD-10-CM

## 2017-04-04 NOTE — Telephone Encounter (Signed)
Patient Name: Pamela Soto  DOB: Dec 22, 1950    Initial Comment Caller says, has trouble breathing when she sleeps , she had a sleep study, but doesn't have a machine yet. She says she is having trouble breathing.    Nurse Assessment  Nurse: Leilani Merl, RN, Heather Date/Time (Eastern Time): 04/04/2017 12:57:11 PM  Confirm and document reason for call. If symptomatic, describe symptoms. ---Caller says, has trouble breathing when she sleeps , she had a sleep study, but doesn't have a machine yet. She says she is having trouble breathing.  Does the patient have any new or worsening symptoms? ---Yes  Will a triage be completed? ---Yes  Related visit to physician within the last 2 weeks? ---No  Does the PT have any chronic conditions? (i.e. diabetes, asthma, etc.) ---Yes  List chronic conditions. ---See MR  Is this a behavioral health or substance abuse call? ---No     Guidelines    Guideline Title Affirmed Question Affirmed Notes  Breathing Difficulty [1] MILD difficulty breathing (e.g., minimal/no SOB at rest, SOB with walking, pulse <100) AND [2] NEW-onset or WORSE than normal    Final Disposition User   See Physician within 4 Hours (or PCP triage) Leilani Merl, RN, Nira Conn    Comments  Appt scheduled with Baldo Ash today at 3:30 pm   Referrals  REFERRED TO PCP OFFICE   Disagree/Comply: Comply

## 2017-04-04 NOTE — Telephone Encounter (Signed)
Patient states she had sleep study done on Thursday 4/26 and was told she had sleep apnea. Patient was told that she had a very severe case. Patient stated she slept hard last and this morning feels like she cannot breathe good, has a very bad headache but no other symptoms. Does not have cpap yet. Routing to Dr. Harl Bowie.

## 2017-04-04 NOTE — Telephone Encounter (Signed)
The official report does not look to be available yet, please go ahead and refer her to Dr Luan Pulling please for sleep apnea.   Zandra Abts MD

## 2017-04-04 NOTE — Telephone Encounter (Signed)
Patient has questions about sleep apnea

## 2017-04-04 NOTE — Telephone Encounter (Signed)
Patient verbalized understanding  

## 2017-04-05 NOTE — Procedures (Signed)
[place] A. Merlene Laughter, MD     [email or web address]             NOCTURNAL POLYSOMNOGRAPHY   LOCATION: [facility]  Patient Name: Pamela Soto, Pamela Soto Date: 03/31/2017 Gender: Female D.O.B: 08-16-1951 Age (years): 34 Referring Provider: Wilfred Lacy NP Height (inches): 65 Interpreting Physician: Phillips Odor MD, ABSM Weight (lbs): 254 RPSGT: Peak, Robert BMI: 42 MRN: [id number] Neck Size: 18.00 CLINICAL INFORMATION The patient is referred for a split night study with BPAP. MEDICATIONS Medications self-administered by patient taken the night of the study : N/A  Current Outpatient Prescriptions:  .  acetaminophen (TYLENOL) 500 MG tablet, Take 1 tablet (500 mg total) by mouth every 6 (six) hours as needed., Disp: 30 tablet, Rfl: 0 .  albuterol (PROVENTIL) (2.5 MG/3ML) 0.083% nebulizer solution, Take 3 mLs (2.5 mg total) by nebulization every 6 (six) hours as needed for wheezing or shortness of breath., Disp: 75 mL, Rfl: 12 .  atorvastatin (LIPITOR) 40 MG tablet, Take 1 tablet (40 mg total) by mouth daily., Disp: 90 tablet, Rfl: 1 .  bisoprolol (ZEBETA) 5 MG tablet, Take 0.5 tablets (2.5 mg total) by mouth daily., Disp: 30 tablet, Rfl: 0 .  clopidogrel (PLAVIX) 75 MG tablet, TAKE ONE TABLET BY MOUTH DAILY., Disp: 30 tablet, Rfl: 0 .  Coenzyme Q10 (CO Q-10) 50 MG CAPS, Take 1 capsule by mouth 2 (two) times daily after a meal., Disp: 60 each, Rfl: 0 .  ELIQUIS 5 MG TABS tablet, TAKE ONE TABLET BY MOUTH TWICE DAILY., Disp: 60 tablet, Rfl: 0 .  furosemide (LASIX) 40 MG tablet, TAKE ONE TABLET BY MOUTH DAILY., Disp: 30 tablet, Rfl: 6 .  lisinopril (PRINIVIL,ZESTRIL) 5 MG tablet, Take 1 tablet (5 mg total) by mouth daily., Disp: 90 tablet, Rfl: 3 .  potassium chloride SA (K-DUR,KLOR-CON) 20 MEQ tablet, TAKE ONE TABLET BY MOUTH DAILY., Disp: 30 tablet, Rfl: 0 .  tiotropium (SPIRIVA HANDIHALER) 18 MCG inhalation capsule, Place 1 capsule (18 mcg total) into inhaler and  inhale daily., Disp: 30 capsule, Rfl: 12   SLEEP STUDY TECHNIQUE As per the AASM Manual for the Scoring of Sleep and Associated Events v2.3 (April 2016) with a hypopnea requiring 4% desaturations.  The channels recorded and monitored were frontal, central and occipital EEG, electrooculogram (EOG), submentalis EMG (chin), nasal and oral airflow, thoracic and abdominal wall motion, anterior tibialis EMG, snore microphone, electrocardiogram, and pulse oximetry. Bi-level positive airway pressure (BiPAP) was initiated when the patient met split night criteria and was titrated according to treat sleep-disordered breathing.  RESPIRATORY PARAMETERS Diagnostic  Total AHI (/hr): 112.0 RDI (/hr): 112.0 OA Index (/hr): 68.2 CA Index (/hr): 1.0 REM AHI (/hr): N/A NREM AHI (/hr): 112.0 Supine AHI (/hr): 108.8 Non-supine AHI (/hr): 112.73 Min O2 Sat (%): 69.00 Mean O2 (%): 81.79 Time below 88% (min): 116.0     Titration: The patient was initially started on CPAP at 5 and titrated to 14. Unfortunately, this was unsuccessful to resolve the apneic events. Consequently, he was placed on bilevel pressures and did well as 15/11.  Optimal IPAP Pressure (cm): 15 Optimal EPAP Pressure (cm): 11 AHI at Optimal Pressure (/hr): 3.7 Min O2 at Optimal Pressure (%): 75.0 Sleep % at Optimal (%): 90 Supine % at Optimal (%): 5         SLEEP ARCHITECTURE The study was initiated at 9:52:58 PM and terminated at 4:57:29 AM. The total recorded time was 424.5 minutes. EEG confirmed total sleep time was 299.0 minutes yielding a  sleep efficiency of 70.4%. Sleep onset after lights out was 1.0 minutes with a REM latency of 278.0 minutes. The patient spent 18.39% of the night in stage N1 sleep, 67.89% in stage N2 sleep, 0.00% in stage N3 and 13.71% in REM. Wake after sleep onset (WASO) was 124.5 minutes. The Arousal Index was 27.5/hour.  LEG MOVEMENT DATA The total Periodic Limb Movements of Sleep (PLMS) were 0. The PLMS index was 0.00  .  CARDIAC DATA The 2 lead EKG demonstrated sinus rhythm. The mean heart rate was 68.52 beats per minute. Other EKG findings include: None.   IMPRESSIONS - Severe obstructive sleep apnea occurred during the diagnostic portion of the study (AHI = 112.0 /hour).  An  BiPAP of 15/11 is selected for this patient. Conventional CPAP was unsuccessful. - Absent slow wave sleep is observed.   Delano Metz, MD Diplomate, American Board of Sleep Medicine.  ELECTRONICALLY SIGNED ON:  04/05/2017, 10:24 AM [facility] PH: [phone]   FX: [phone] [place]

## 2017-04-07 ENCOUNTER — Encounter: Payer: Self-pay | Admitting: Cardiology

## 2017-04-07 ENCOUNTER — Ambulatory Visit (INDEPENDENT_AMBULATORY_CARE_PROVIDER_SITE_OTHER): Payer: Medicare Other | Admitting: Cardiology

## 2017-04-07 VITALS — BP 121/76 | HR 81 | Ht 65.5 in | Wt 254.0 lb

## 2017-04-07 DIAGNOSIS — I5022 Chronic systolic (congestive) heart failure: Secondary | ICD-10-CM | POA: Diagnosis not present

## 2017-04-07 DIAGNOSIS — E782 Mixed hyperlipidemia: Secondary | ICD-10-CM | POA: Diagnosis not present

## 2017-04-07 DIAGNOSIS — I4891 Unspecified atrial fibrillation: Secondary | ICD-10-CM | POA: Diagnosis not present

## 2017-04-07 DIAGNOSIS — I2510 Atherosclerotic heart disease of native coronary artery without angina pectoris: Secondary | ICD-10-CM

## 2017-04-07 MED ORDER — BISOPROLOL FUMARATE 5 MG PO TABS: 5.0000 mg | ORAL_TABLET | Freq: Every day | ORAL | 1 refills | Status: DC

## 2017-04-07 MED ORDER — ATORVASTATIN CALCIUM 20 MG PO TABS: 20.0000 mg | ORAL_TABLET | Freq: Every day | ORAL | 1 refills | Status: DC

## 2017-04-07 NOTE — Progress Notes (Addendum)
Clinical Summary Pamela Soto is a 66 y.o.female seen today for follow up of the following medical problems.   1. NICM/Chronic systolic HF/NSTEMI - admit Jan 2018 with CHF - echo at that time showed LVEF 35-40%, a new finding for the patient.  - referred for cath. Feb 2018 cath: distal LAD 100%, Prox RCA 40%, OM3 40%. RHC mean PA 37, PCWP 26, CI 1.73. CAD medically managed.    - home weights 254 lbs. Limiting salt intake - compliant with meds - on plavix since recent cath - can have some orthoststatic symptoms - cardiac rehab to start end of May.  2. COPD - she does remember PFTs - has nebulizer machine at home with prn albutero - significant second hand smoke. Can have wheezing at times.   - 03/2017 PFTs: severe COPD - started on spiriva - followed by Dr Luan Pulling  3. Afib - no recent palpitations - denies any bleeding on anticoag   4. Hyperlipidemia - reports nonspecific neck and shoulder pains, unclear if related to statin.  - somewhat improved with lower statin.    5. DM2 - followed by pcp - last HgbA1c  was 6.7  6. OSA - sleep study 03/2017 with severe OSA. Recs for bipap as cpap was unsuccesful.  - awaiting machine     Past Medical History:  Diagnosis Date  . Acid reflux   . Hypertension      No Known Allergies   Current Outpatient Prescriptions  Medication Sig Dispense Refill  . acetaminophen (TYLENOL) 500 MG tablet Take 1 tablet (500 mg total) by mouth every 6 (six) hours as needed. 30 tablet 0  . albuterol (PROVENTIL) (2.5 MG/3ML) 0.083% nebulizer solution Take 3 mLs (2.5 mg total) by nebulization every 6 (six) hours as needed for wheezing or shortness of breath. 75 mL 12  . atorvastatin (LIPITOR) 40 MG tablet Take 1 tablet (40 mg total) by mouth daily. 90 tablet 1  . bisoprolol (ZEBETA) 5 MG tablet Take 0.5 tablets (2.5 mg total) by mouth daily. 30 tablet 0  . clopidogrel (PLAVIX) 75 MG tablet TAKE ONE TABLET BY MOUTH DAILY. 30 tablet 0    . Coenzyme Q10 (CO Q-10) 50 MG CAPS Take 1 capsule by mouth 2 (two) times daily after a meal. 60 each 0  . ELIQUIS 5 MG TABS tablet TAKE ONE TABLET BY MOUTH TWICE DAILY. 60 tablet 0  . furosemide (LASIX) 40 MG tablet TAKE ONE TABLET BY MOUTH DAILY. 30 tablet 6  . lisinopril (PRINIVIL,ZESTRIL) 5 MG tablet Take 1 tablet (5 mg total) by mouth daily. 90 tablet 3  . potassium chloride SA (K-DUR,KLOR-CON) 20 MEQ tablet TAKE ONE TABLET BY MOUTH DAILY. 30 tablet 0  . tiotropium (SPIRIVA HANDIHALER) 18 MCG inhalation capsule Place 1 capsule (18 mcg total) into inhaler and inhale daily. 30 capsule 12   No current facility-administered medications for this visit.      Past Surgical History:  Procedure Laterality Date  . BREAST BIOPSY Right    normal result  . CESAREAN SECTION    . CHOLECYSTECTOMY  11/06/2012   Procedure: LAPAROSCOPIC CHOLECYSTECTOMY WITH INTRAOPERATIVE CHOLANGIOGRAM;  Surgeon: Pedro Earls, MD;  Location: [facility];  Service: General;  Laterality: N/A;  . RIGHT/LEFT HEART CATH AND CORONARY ANGIOGRAPHY N/A 01/10/2017   Procedure: Right/Left Heart Cath and Coronary Angiography;  Surgeon: Belva Crome, MD;  Location: [facility];  Service: Cardiovascular;  Laterality: N/A;  . UMBILICAL HERNIA REPAIR  11/06/2012  Procedure: HERNIA REPAIR UMBILICAL ADULT;  Surgeon: Pedro Earls, MD;  Location: [facility];  Service: General;;     No Known Allergies    Family History  Problem Relation Age of Onset  . Heart disease Mother 52  . Heart attack Mother   . Asthma Father   . Cancer Father     pancreastic cancer  . Heart attack Brother 66  . Cancer Sister     unsure     Social History Ms. Heffelfinger reports that she has never smoked. She has never used smokeless tobacco. Ms. Surges reports that she does not drink alcohol.   Review of Systems CONSTITUTIONAL: No weight loss, fever, chills, weakness or fatigue.  HEENT: Eyes: No visual loss, blurred vision, double vision or  yellow sclerae.No hearing loss, sneezing, congestion, runny nose or sore throat.  SKIN: No rash or itching.  CARDIOVASCULAR: per hpi RESPIRATORY: No shortness of breath, cough or sputum.  GASTROINTESTINAL: No anorexia, nausea, vomiting or diarrhea. No abdominal pain or blood.  GENITOURINARY: No burning on urination, no polyuria NEUROLOGICAL: No headache, dizziness, syncope, paralysis, ataxia, numbness or tingling in the extremities. No change in bowel or bladder control.  MUSCULOSKELETAL: No muscle, back pain, joint pain or stiffness.  LYMPHATICS: No enlarged nodes. No history of splenectomy.  PSYCHIATRIC: No history of depression or anxiety.  ENDOCRINOLOGIC: No reports of sweating, cold or heat intolerance. No polyuria or polydipsia.  [name]   Physical Examination Vitals:   04/07/17 1051  BP: 121/76  Pulse: 81   Vitals:   04/07/17 1051  Weight: 254 lb (115.2 kg)  Height: 5' 5.5" (1.664 m)    Gen: resting comfortably, no acute distress HEENT: no scleral icterus, pupils equal round and reactive, no palptable cervical adenopathy,  CV: RRR, no m/r/g, no jvd Resp: Clear to auscultation bilaterally GI: abdomen is soft, non-tender, non-distended, normal bowel sounds, no hepatosplenomegaly MSK: extremities are warm, no edema.  Skin: warm, no rash Neuro:  no focal deficits Psych: appropriate affect   Diagnostic Studies 01/2017 cath  Dist LAD lesion, 100 %stenosed.  Prox RCA lesion, 40 %stenosed.  3rd Mrg lesion, 40 %stenosed.  There is severe left ventricular systolic dysfunction.  The left ventricular ejection fraction is less than 25% by visual estimate.  LV end diastolic pressure is severely elevated.  Hemodynamic findings consistent with moderate pulmonary hypertension.   Acute atrial fibrillation developed on the cath table during right heart/right atrial pressure recording. Rate 118 bpm. Atrial fibrillation was treated with one bolus of IV amiodarone.  40% proximal  RCA stenosis.  Tortuous but normal circumflex  100% distal/apical LAD stenosis. This lesion is probably chronic but difficult to determine age. There is also a possibility that it is acute embolic occlusion from a catheter-based thrombus. For that reason additional heparin and then Aggrastat bolus was given as the patient complained of chest pressure post procedure.  Chest pressure post procedure is similar to prior episodes.  Severe LV dysfunction with EF less than 25%. Contraction pattern appears global.  Arterial blood gas demonstrated severe CO2 retention, pCO2 67.    01/2017 echo Study Conclusions  - Left ventricle: The cavity size was mildly dilated. Wall thickness was increased in a pattern of moderate LVH. Systolic function was moderately reduced. The estimated ejection fraction was in the range of 35% to 40%. Diffuse hypokinesis. - Aortic valve: Mildly calcified annulus. Trileaflet; mildly thickened leaflets. - Mitral valve: There was mild regurgitation. - Left atrium: The atrium was moderately dilated. -  Atrial septum: The septum bowed from left to right, consistent with increased left atrial pressure. - Pulmonic valve: There was mild regurgitation.     Assessment and Plan  1. CAD/Chronic systolic HF/NSTEMI - symptoms improving. We will increase bisoprolol to 5mg  daily.  - continue plavix s/p recent NSTEMI  2. Afib -  Continue current meds. CHADS2Vasc score is 5, continue eliquis  3. Hyperlipidemia - nonspecific muscle aches, we will try lowering atorva to 20mg    . 4. Hypoxia - office O2 sat at rest on room air is 88%, will need to be considered for home O2.       Arnoldo Lenis, M.D.

## 2017-04-07 NOTE — Patient Instructions (Signed)
Your physician recommends that you schedule a follow-up appointment in: 3-4 [place] has recommended you make the following change in your medication:   DECREASE ATORVASTATIN 20 MG DAILY   INCREASE BISOPROLOL 5 MG DAILY   Thank you for choosing [facility]!!

## 2017-04-14 ENCOUNTER — Encounter (HOSPITAL_COMMUNITY)
Admission: RE | Admit: 2017-04-14 | Discharge: 2017-04-14 | Disposition: A | Discharge status: Home / Self Care | Payer: Medicare Other | Source: Ambulatory Visit | Attending: Cardiology | Admitting: Cardiology

## 2017-04-14 ENCOUNTER — Encounter (HOSPITAL_COMMUNITY): Payer: Self-pay

## 2017-04-14 ENCOUNTER — Other Ambulatory Visit: Payer: Self-pay | Admitting: Cardiology

## 2017-04-14 VITALS — BP 110/52 | HR 74 | Ht 65.5 in | Wt 258.0 lb

## 2017-04-14 DIAGNOSIS — I4891 Unspecified atrial fibrillation: Secondary | ICD-10-CM | POA: Diagnosis not present

## 2017-04-14 DIAGNOSIS — I5022 Chronic systolic (congestive) heart failure: Secondary | ICD-10-CM | POA: Diagnosis not present

## 2017-04-14 DIAGNOSIS — J9611 Chronic respiratory failure with hypoxia: Secondary | ICD-10-CM | POA: Diagnosis not present

## 2017-04-14 DIAGNOSIS — G4733 Obstructive sleep apnea (adult) (pediatric): Secondary | ICD-10-CM | POA: Diagnosis not present

## 2017-04-14 DIAGNOSIS — J449 Chronic obstructive pulmonary disease, unspecified: Secondary | ICD-10-CM | POA: Diagnosis not present

## 2017-04-14 NOTE — Progress Notes (Signed)
Cardiac/Pulmonary Rehab Medication Review by a Pharmacist  Does the patient  feel that his/her medications are working for him/her?  yes  Has the patient been experiencing any side effects to the medications prescribed?  Yes.  Her statin medication was recently decreased due to body aches.  Does the patient measure his/her own blood pressure or blood glucose at home?  yes   Does the patient have any problems obtaining medications due to transportation or finances?   no  Understanding of regimen: excellent Understanding of indications: excellent Potential of compliance: excellent   Pharmacist comments: She takes all of her medications when the news comes on in the evening.    Excell Seltzer P[place]et 04/14/2017 2:13 PM

## 2017-04-14 NOTE — Progress Notes (Signed)
6 MIN NUSTEP TEST  Date: 04/14/2017 Weight: 117kg Height: 65.5     REST   6-MIN   POST 2-MIN HR   74   81   75 BP   110/52   120/58   112/54 O2   90   89   91 RPE   6   11   6  RPD   6   11   6   Distance: 1066ft. .20 miles  Ex METs: 1.9  Comments: No s/s. 1.89 mph, 15 watts on average.

## 2017-04-14 NOTE — Progress Notes (Signed)
Cardiac Individual Treatment Plan  Patient Details  Name: Pamela Soto MRN: [id number] Date of Birth: [birth year] Referring Provider:     CARDIAC REHAB PHASE II ORIENTATION from 04/14/2017 in [place]  Referring Provider  Dr. Harl Bowie      Initial Encounter Date:    CARDIAC REHAB PHASE II ORIENTATION from 04/14/2017 in [place]  Date  04/14/17  Referring Provider  Dr. Harl Bowie      Visit Diagnosis: Chronic systolic heart failure ([place])  Patient's Home Medications on Admission:  Current Outpatient Prescriptions:  .  acetaminophen (TYLENOL) 500 MG tablet, Take 1 tablet (500 mg total) by mouth every 6 (six) hours as needed., Disp: 30 tablet, Rfl: 0 .  albuterol (PROVENTIL) (2.5 MG/3ML) 0.083% nebulizer solution, Take 3 mLs (2.5 mg total) by nebulization every 6 (six) hours as needed for wheezing or shortness of breath., Disp: 75 mL, Rfl: 12 .  atorvastatin (LIPITOR) 20 MG tablet, Take 1 tablet (20 mg total) by mouth daily., Disp: 90 tablet, Rfl: 1 .  bisoprolol (ZEBETA) 5 MG tablet, Take 1 tablet (5 mg total) by mouth daily., Disp: 90 tablet, Rfl: 1 .  clopidogrel (PLAVIX) 75 MG tablet, TAKE ONE TABLET BY MOUTH DAILY., Disp: 30 tablet, Rfl: 6 .  Coenzyme Q10 (CO Q-10) 50 MG CAPS, Take 1 capsule by mouth 2 (two) times daily after a meal., Disp: 60 each, Rfl: 0 .  ELIQUIS 5 MG TABS tablet, TAKE ONE TABLET BY MOUTH TWICE DAILY., Disp: 60 tablet, Rfl: 0 .  furosemide (LASIX) 40 MG tablet, TAKE ONE TABLET BY MOUTH DAILY., Disp: 30 tablet, Rfl: 6 .  lisinopril (PRINIVIL,ZESTRIL) 5 MG tablet, Take 1 tablet (5 mg total) by mouth daily., Disp: 90 tablet, Rfl: 3 .  potassium chloride SA (K-DUR,KLOR-CON) 20 MEQ tablet, TAKE ONE TABLET BY MOUTH DAILY., Disp: 30 tablet, Rfl: 6 .  tiotropium (SPIRIVA HANDIHALER) 18 MCG inhalation capsule, Place 1 capsule (18 mcg total) into inhaler and inhale daily., Disp: 30 capsule, Rfl: 12  Past Medical History: Past  Medical History:  Diagnosis Date  . Acid reflux   . Hypertension     Tobacco Use: History  Smoking Status  . Never Smoker  Smokeless Tobacco  . Never Used    Labs: Recent Review Flowsheet Data    Labs for ITP Cardiac and Pulmonary Rehab Latest Ref Rng & Units 01/05/2017 01/08/2017 01/10/2017 01/10/2017 02/17/2017   Cholestrol 0 - 200 mg/dL - 210(H) - - -   LDLCALC 0 - 99 mg/dL - 129(H) - - -   HDL >40 mg/dL - 57 - - -   Trlycerides <150 mg/dL - 118 - - -   Hemoglobin A1c 4.6 - 6.5 % 6.1(H) - - - 6.7(H)   PHART 7.350 - 7.450 - - 7.349(L) - -   PCO2ART 32.0 - 48.0 mmHg - - 68.5(HH) - -   HCO3 20.0 - 28.0 mmol/L - - 37.8(H) 39.3(H) -   TCO2 0 - 100 mmol/L - - 40 42 -   O2SAT % - - 94.0 56.0 -      Capillary Blood Glucose: Lab Results  Component Value Date   GLUCAP 99 11/05/2012     Exercise Target Goals: Date: 04/14/17  Exercise Program Goal: Individual exercise prescription set with THRR, safety & activity barriers. Participant demonstrates ability to understand and report RPE using BORG scale, to self-measure pulse accurately, and to acknowledge the importance of the exercise prescription.  Exercise Prescription Goal: Starting with  aerobic activity 30 plus minutes a day, 3 days per week for initial exercise prescription. Provide home exercise prescription and guidelines that participant acknowledges understanding prior to discharge.  Activity Barriers & Risk Stratification:     Activity Barriers & Cardiac Risk Stratification - 04/14/17 1503      Activity Barriers & Cardiac Risk Stratification   Activity Barriers Shortness of Breath   Cardiac Risk Stratification High      6 Minute Walk:  6 MIN NUSTEP TEST  Date: 04/14/2017 Weight: 117kg Height: 65.5                                      REST                           6-MIN                          POST 2-MIN HR                               74                                81                                 75 BP                               110/52                         120/58                         112/54 O2                               90                                89                                91 RPE                             6                                  11                                6  RPD                             6  11                                6  Distance: 1067ft. .20 miles  Ex METs: 1.9  Comments: No s/s. 1.89 mph, 15 watts on average.   Oxygen Initial Assessment:   Oxygen Re-Evaluation:   Oxygen Discharge (Final Oxygen Re-Evaluation):   Initial Exercise Prescription:     Initial Exercise Prescription - 04/14/17 1400      Date of Initial Exercise RX and Referring Provider   Date 04/14/17   Referring Provider Dr. Harl Bowie     NuStep   Level 2   SPM 9   Minutes 15   METs 1.7     Arm Ergometer   Level 1.5   Watts 11   RPM 11   Minutes 20   METs 1.8     Prescription Details   Frequency (times per week) 3   Duration Progress to 30 minutes of continuous aerobic without signs/symptoms of physical distress     Intensity   THRR 40-80% of Max Heartrate 106-123-139   Ratings of Perceived Exertion 11-13   Perceived Dyspnea 0-4     Progression   Progression Continue progressive overload as per policy without signs/symptoms or physical distress.     Resistance Training   Training Prescription Yes   Weight 1   Reps 10-15      Perform Capillary Blood Glucose checks as needed.  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       Intervention Provide advice, education, support and counseling about physical activity/exercise needs.;Develop an  individualized exercise prescription for aerobic and resistive training based on initial evaluation findings, risk stratification, comorbidities and participant's personal goals.       Expected Outcomes Achievement of increased cardiorespiratory fitness and enhanced flexibility, muscular endurance and strength shown through measurements of functional capacity and personal statement of participant.       Increase Strength and Stamina Yes       Intervention Provide advice, education, support and counseling about physical activity/exercise needs.;Develop an individualized exercise prescription for aerobic and resistive training based on initial evaluation findings, risk stratification, comorbidities and participant's personal goals.       Expected Outcomes Achievement of increased cardiorespiratory fitness and enhanced flexibility, muscular endurance and strength shown through measurements of functional capacity and personal statement of participant.          Exercise Goals Re-Evaluation :    Discharge Exercise Prescription (Final Exercise Prescription Changes):   Nutrition:  Target Goals: Understanding of nutrition guidelines, daily intake of sodium 1500mg , cholesterol 200mg , calories 30% from fat and 7% or less from saturated fats, daily to have 5 or more servings of fruits and vegetables.  Biometrics:     Pre Biometrics - 04/14/17 1441      Pre Biometrics   Height 5' 5.5" (1.664 m)   Waist Circumference 47 inches   Hip Circumference 51 inches   Waist to Hip Ratio 0.92 %   BMI (Calculated) 42.4   Triceps Skinfold 22 mm   % Body Fat 49.2 %  Grip Strength 46.33 kg   Flexibility 0 in   Single Leg Stand 2 seconds       Nutrition Therapy Plan and Nutrition Goals:   Nutrition Discharge: Rate Your Plate Scores:     Nutrition Assessments - 04/14/17 1506      MEDFICTS Scores   Pre Score 3      Nutrition Goals Re-Evaluation:   Nutrition Goals Discharge (Final Nutrition  Goals Re-Evaluation):   Psychosocial: Target Goals: Acknowledge presence or absence of significant depression and/or stress, maximize coping skills, provide positive support system. Participant is able to verbalize types and ability to use techniques and skills needed for reducing stress and depression.  Initial Review & Psychosocial Screening:     Initial Psych Review & Screening - 04/14/17 1510      Initial Review   Current issues with Current Depression     Family Dynamics   Good Support System? Yes     Barriers   Psychosocial barriers to participate in program The patient should benefit from training in stress management and relaxation.     Screening Interventions   Interventions Encouraged to exercise  Patient feels down at times related to her health. She feels is she could lose some weight and have more energy her psychosocial issues would be resolved.       Quality of Life Scores:     Quality of Life - 04/14/17 1442      Quality of Life Scores   Health/Function Pre 4.8 %   Socioeconomic Pre 12 %   Psych/Spiritual Pre 10.29 %   Family Pre 12 %   GLOBAL Pre 8.57 %      PHQ-9: Recent Review Flowsheet Data    Depression screen [facility] 2/9 04/14/2017 02/17/2017   Decreased Interest 1 0   Down, Depressed, Hopeless 0 0   PHQ - 2 Score 1 0   Altered sleeping 1 -   Tired, decreased energy 3 -   Change in appetite 1 -   Feeling bad or failure about yourself  0 -   Trouble concentrating 1 -   Moving slowly or fidgety/restless 1 -   Suicidal thoughts 0 -   PHQ-9 Score 8 -   Difficult doing work/chores Very difficult -     Interpretation of Total Score  Total Score Depression Severity:  1-4 = Minimal depression, 5-9 = Mild depression, 10-14 = Moderate depression, 15-19 = Moderately severe depression, 20-27 = Severe depression   Psychosocial Evaluation and Intervention:     Psychosocial Evaluation - 04/14/17 1514      Psychosocial Evaluation & Interventions    Interventions Stress management education;Relaxation education;Encouraged to exercise with the program and follow exercise prescription   Comments Patient feels down at times related to her health. She feels is she could lose some weight and have more energy her psychosocial issues would be resolved. Her PHQ-9 score was 8 and her QOL score was 8.57 indicative of depression.   Expected Outcomes Patient will have increased energy at discharge with improved PHQ-9 and QOL scores.    Continue Psychosocial Services  Follow up required by staff      Psychosocial Re-Evaluation:   Psychosocial Discharge (Final Psychosocial Re-Evaluation):   Vocational Rehabilitation: Provide vocational rehab assistance to qualifying candidates.   Vocational Rehab Evaluation & Intervention:     Vocational Rehab - 04/14/17 1502      Initial Vocational Rehab Evaluation & Intervention   Assessment shows need for Vocational Rehabilitation No  Education: Education Goals: Education classes will be provided on a weekly basis, covering required topics. Participant will state understanding/return demonstration of topics presented.  Learning Barriers/Preferences:     Learning Barriers/Preferences - 04/14/17 1502      Learning Barriers/Preferences   Learning Barriers None   Learning Preferences Skilled Demonstration;Pictoral;Video      Education Topics: Hypertension, Hypertension Reduction -Define heart disease and high blood pressure. Discus how high blood pressure affects the body and ways to reduce high blood pressure.   Exercise and Your Heart -Discuss why it is important to exercise, the FITT principles of exercise, normal and abnormal responses to exercise, and how to exercise safely.   Angina -Discuss definition of angina, causes of angina, treatment of angina, and how to decrease risk of having angina.   Cardiac Medications -Review what the following cardiac medications are used for, how  they affect the body, and side effects that may occur when taking the medications.  Medications include Aspirin, Beta blockers, calcium channel blockers, ACE Inhibitors, angiotensin receptor blockers, diuretics, digoxin, and antihyperlipidemics.   Congestive Heart Failure -Discuss the definition of CHF, how to live with CHF, the signs and symptoms of CHF, and how keep track of weight and sodium intake.   Heart Disease and Intimacy -Discus the effect sexual activity has on the heart, how changes occur during intimacy as we age, and safety during sexual activity.   Smoking Cessation / COPD -Discuss different methods to quit smoking, the health benefits of quitting smoking, and the definition of COPD.   Nutrition I: Fats -Discuss the types of cholesterol, what cholesterol does to the heart, and how cholesterol levels can be controlled.   Nutrition II: Labels -Discuss the different components of food labels and how to read food label   Heart Parts and Heart Disease -Discuss the anatomy of the heart, the pathway of blood circulation through the heart, and these are affected by heart disease.   Stress I: Signs and Symptoms -Discuss the causes of stress, how stress may lead to anxiety and depression, and ways to limit stress.   Stress II: Relaxation -Discuss different types of relaxation techniques to limit stress.   Warning Signs of Stroke / TIA -Discuss definition of a stroke, what the signs and symptoms are of a stroke, and how to identify when someone is having stroke.   Knowledge Questionnaire Score:     Knowledge Questionnaire Score - 04/14/17 1502      Knowledge Questionnaire Score   Pre Score 20/24      Core Components/Risk Factors/Patient Goals at Admission:     Personal Goals and Risk Factors at Admission - 04/14/17 1506      Core Components/Risk Factors/Patient Goals on Admission    Weight Management Obesity;Yes   Intervention Weight Management: Develop a  combined nutrition and exercise program designed to reach desired caloric intake, while maintaining appropriate intake of nutrient and fiber, sodium and fats, and appropriate energy expenditure required for the weight goal.   Admit Weight 258 lb (117 kg)   Goal Weight: Short Term 253 lb (114.8 kg)   Goal Weight: Long Term 243 lb (110.2 kg)   Expected Outcomes Short Term: Continue to assess and modify interventions until short term weight is achieved;Long Term: Adherence to nutrition and physical activity/exercise program aimed toward attainment of established weight goal   Improve shortness of breath with ADL's Yes   Intervention Provide education, individualized exercise plan and daily activity instruction to help decrease symptoms of SOB with  activities of daily living.   Expected Outcomes Short Term: Achieves a reduction of symptoms when performing activities of daily living.   Develop more efficient breathing techniques such as purse lipped breathing and diaphragmatic breathing; and practicing self-pacing with activity Yes   Intervention Provide education, demonstration and support about specific breathing techniuqes utilized for more efficient breathing. Include techniques such as pursed lipped breathing, diaphragmatic breathing and self-pacing activity.   Expected Outcomes Short Term: Participant will be able to demonstrate and use breathing techniques as needed throughout daily activities.   Heart Failure Yes   Intervention Provide a combined exercise and nutrition program that is supplemented with education, support and counseling about heart failure. Directed toward relieving symptoms such as shortness of breath, decreased exercise tolerance, and extremity edema.   Expected Outcomes Improve functional capacity of life;Short term: Attendance in program 2-3 days a week with increased exercise capacity. Reported lower sodium intake. Reported increased fruit and vegetable intake. Reports medication  compliance.   Personal Goal Other Yes   Personal Goal Lose 50 lbs long term. Eat right   Intervention Patient will attend CR 3 days/week and supplement with exercise at home 2 days/week.   Expected Outcomes Patient will meet her personal goals.      Core Components/Risk Factors/Patient Goals Review:    Core Components/Risk Factors/Patient Goals at Discharge (Final Review):    ITP Comments:   Comments: Patient arrived for 1st visit/orientation/education at 1230. Patient was referred to CR by [facility] due to Chronic Systolic Heart Failure (J19.14). During orientation advised patient on arrival and appointment times what to wear, what to do before, during and after exercise. Reviewed attendance and class policy. Talked about inclement weather and class consultation policy. Pt is scheduled to return to Cardiac Rehab on 04/18/17 at 1100. Pt was advised to come to class 15 minutes before class starts. Patient was also given instructions on meeting with the dietician and attending the Family Structure classes. Pt is eager to get started. Patient participated in warm up stretches followed by light weights and resistance bands. Patient was able to complete 6 minute NuStep test. Patient was measured for the equipment. Discussed equipment safety with patient. Took patient pre-anthropometric measurements. Patient finished visit at 1430.

## 2017-04-18 ENCOUNTER — Other Ambulatory Visit: Payer: Self-pay | Admitting: Cardiology

## 2017-04-18 ENCOUNTER — Encounter (HOSPITAL_COMMUNITY)
Admission: RE | Admit: 2017-04-18 | Discharge: 2017-04-18 | Disposition: A | Discharge status: Home / Self Care | Payer: Medicare Other | Source: Ambulatory Visit | Attending: Cardiology | Admitting: Cardiology

## 2017-04-18 DIAGNOSIS — I5022 Chronic systolic (congestive) heart failure: Secondary | ICD-10-CM

## 2017-04-18 NOTE — Progress Notes (Signed)
Daily Session Note  Patient Details  Name: CABELA PACIFICO MRN: [id number] Date of Birth: 1951/05/10 Referring Provider:     CARDIAC REHAB PHASE II ORIENTATION from 04/14/2017 in [place]  Referring Provider  Dr. Harl Bowie      Encounter Date: 04/18/2017  Check In:     Session Check In - 04/18/17 1100      Check-In   Location [facility]   Staff Present Suzanne Boron, BS, EP, Exercise Physiologist;Haden Cavenaugh Wynetta Emery, RN, BSN   Supervising physician immediately available to respond to emergencies See telemetry face sheet for immediately available MD   Medication changes reported     No   Fall or balance concerns reported    No   Warm-up and Cool-down Performed as group-led instruction   Resistance Training Performed Yes   VAD Patient? No     Pain Assessment   Currently in Pain? No/denies   Pain Score 0-No pain   Multiple Pain Sites No      Capillary Blood Glucose: No results found for this or any previous visit (from the past 24 hour(s)).    History  Smoking Status  . Never Smoker  Smokeless Tobacco  . Never Used    Goals Met:  Independence with exercise equipment Exercise tolerated well No report of cardiac concerns or symptoms Strength training completed today  Goals Unmet:  Not Applicable  Comments: Check out 1200.   Dr. Kate Sable is Medical Director for [facility] Cardiac and Pulmonary Rehab.

## 2017-04-20 ENCOUNTER — Encounter (HOSPITAL_COMMUNITY)
Admission: RE | Admit: 2017-04-20 | Discharge: 2017-04-20 | Disposition: A | Discharge status: Home / Self Care | Payer: Medicare Other | Source: Ambulatory Visit | Attending: Cardiology | Admitting: Cardiology

## 2017-04-20 ENCOUNTER — Telehealth: Payer: Self-pay | Admitting: *Deleted

## 2017-04-20 DIAGNOSIS — I5022 Chronic systolic (congestive) heart failure: Secondary | ICD-10-CM | POA: Diagnosis not present

## 2017-04-20 NOTE — Progress Notes (Signed)
Daily Session Note  Patient Details  Name: Pamela Soto MRN: [id number] Date of Birth: 1951/04/01 Referring Provider:     CARDIAC REHAB PHASE II ORIENTATION from 04/14/2017 in [place]  Referring Provider  Dr. Harl Bowie      Encounter Date: 04/20/2017  Check In:     Session Check In - 04/20/17 1126      Check-In   Location [facility]   Staff Present Aundra Dubin, RN, BSN;Philisha Weinel Luther Parody, BS, EP, Exercise Physiologist   Supervising physician immediately available to respond to emergencies See telemetry face sheet for immediately available MD   Medication changes reported     No   Fall or balance concerns reported    No   Warm-up and Cool-down Performed as group-led instruction   Resistance Training Performed Yes   VAD Patient? No     Pain Assessment   Currently in Pain? No/denies   Pain Score 0-No pain   Multiple Pain Sites No      Capillary Blood Glucose: No results found for this or any previous visit (from the past 24 hour(s)).    History  Smoking Status  . Never Smoker  Smokeless Tobacco  . Never Used    Goals Met:  Independence with exercise equipment Exercise tolerated well No report of cardiac concerns or symptoms Strength training completed today  Goals Unmet:  Not Applicable  Comments: Check out 1200   Dr. Kate Sable is Medical Director for [facility] and Pulmonary Rehab.

## 2017-04-20 NOTE — Telephone Encounter (Signed)
PA for spiriva approved through 04/20/18 - member ID [id number] - faxed to [facility]

## 2017-04-22 ENCOUNTER — Encounter (HOSPITAL_COMMUNITY): Payer: Medicare Other

## 2017-04-25 ENCOUNTER — Encounter (HOSPITAL_COMMUNITY)
Admission: RE | Admit: 2017-04-25 | Discharge: 2017-04-25 | Disposition: A | Discharge status: Home / Self Care | Payer: Medicare Other | Source: Ambulatory Visit | Attending: Cardiology | Admitting: Cardiology

## 2017-04-25 DIAGNOSIS — I5022 Chronic systolic (congestive) heart failure: Secondary | ICD-10-CM

## 2017-04-25 NOTE — Progress Notes (Signed)
Daily Session Note  Patient Details  Name: Pamela Soto MRN: [id number] Date of Birth: 16-Jul-1951 Referring Provider:     CARDIAC REHAB PHASE II ORIENTATION from 04/14/2017 in [place]  Referring Provider  Dr. Harl Bowie      Encounter Date: 04/25/2017  Check In:     Session Check In - 04/25/17 1135      Check-In   Location [facility]   Staff Present Russella Dar, MS, EP, [facility], Exercise Physiologist;Ilaria Much Luther Parody, BS, EP, Exercise Physiologist   Supervising physician immediately available to respond to emergencies See telemetry face sheet for immediately available MD   Medication changes reported     No   Fall or balance concerns reported    No   Warm-up and Cool-down Performed as group-led instruction   Resistance Training Performed Yes   VAD Patient? No     Pain Assessment   Currently in Pain? No/denies   Pain Score 0-No pain   Multiple Pain Sites No      Capillary Blood Glucose: No results found for this or any previous visit (from the past 24 hour(s)).    History  Smoking Status  . Never Smoker  Smokeless Tobacco  . Never Used    Goals Met:  Independence with exercise equipment Exercise tolerated well No report of cardiac concerns or symptoms Strength training completed today  Goals Unmet:  Not Applicable  Comments: Check out 1200   Dr. Kate Sable is Medical Director for [place] and Pulmonary Rehab.

## 2017-04-27 ENCOUNTER — Encounter (HOSPITAL_COMMUNITY)
Admission: RE | Admit: 2017-04-27 | Discharge: 2017-04-27 | Disposition: A | Discharge status: Home / Self Care | Payer: Medicare Other | Source: Ambulatory Visit | Attending: Cardiology | Admitting: Cardiology

## 2017-04-27 DIAGNOSIS — I5022 Chronic systolic (congestive) heart failure: Secondary | ICD-10-CM

## 2017-04-27 NOTE — Progress Notes (Signed)
Daily Session Note  Patient Details  Name: DESIRAI TRAXLER MRN: [id number] Date of Birth: 04-25-1951 Referring Provider:     CARDIAC REHAB PHASE II ORIENTATION from 04/14/2017 in [place]  Referring Provider  Dr. Harl Bowie      Encounter Date: 04/27/2017  Check In:     Session Check In - 04/27/17 1115      Check-In   Location [facility]   Staff Present Aundra Dubin, RN, BSN;Journey Castonguay Luther Parody, BS, EP, Exercise Physiologist   Supervising physician immediately available to respond to emergencies See telemetry face sheet for immediately available MD   Medication changes reported     No   Fall or balance concerns reported    No   Warm-up and Cool-down Performed as group-led instruction   Resistance Training Performed Yes   VAD Patient? No     Pain Assessment   Currently in Pain? No/denies   Pain Score 0-No pain   Multiple Pain Sites No      Capillary Blood Glucose: No results found for this or any previous visit (from the past 24 hour(s)).    History  Smoking Status  . Never Smoker  Smokeless Tobacco  . Never Used    Goals Met:  Independence with exercise equipment Exercise tolerated well No report of cardiac concerns or symptoms Strength training completed today  Goals Unmet:  Not Applicable  Comments: Check out 1200   Dr. Kate Sable is Medical Director for [place] and Pulmonary Rehab.

## 2017-04-27 NOTE — Progress Notes (Signed)
Cardiac Individual Treatment Plan  Patient Details  Name: Pamela Soto MRN: [id number] Date of Birth: [birth year] Referring Provider:     CARDIAC REHAB PHASE II ORIENTATION from 04/14/2017 in [place]  Referring Provider  Dr. Harl Bowie      Initial Encounter Date:    CARDIAC REHAB PHASE II ORIENTATION from 04/14/2017 in [facility]  Date  04/14/17  Referring Provider  Dr. Harl Bowie      Visit Diagnosis: Chronic systolic heart failure ([place])  Patient's Home Medications on Admission:  Current Outpatient Prescriptions:  .  acetaminophen (TYLENOL) 500 MG tablet, Take 1 tablet (500 mg total) by mouth every 6 (six) hours as needed., Disp: 30 tablet, Rfl: 0 .  albuterol (PROVENTIL) (2.5 MG/3ML) 0.083% nebulizer solution, Take 3 mLs (2.5 mg total) by nebulization every 6 (six) hours as needed for wheezing or shortness of breath., Disp: 75 mL, Rfl: 12 .  atorvastatin (LIPITOR) 20 MG tablet, Take 1 tablet (20 mg total) by mouth daily., Disp: 90 tablet, Rfl: 1 .  bisoprolol (ZEBETA) 5 MG tablet, Take 1 tablet (5 mg total) by mouth daily., Disp: 90 tablet, Rfl: 1 .  clopidogrel (PLAVIX) 75 MG tablet, TAKE ONE TABLET BY MOUTH DAILY., Disp: 30 tablet, Rfl: 6 .  Coenzyme Q10 (CO Q-10) 50 MG CAPS, Take 1 capsule by mouth 2 (two) times daily after a meal., Disp: 60 each, Rfl: 0 .  ELIQUIS 5 MG TABS tablet, TAKE ONE TABLET BY MOUTH TWICE DAILY., Disp: 60 tablet, Rfl: 3 .  furosemide (LASIX) 40 MG tablet, TAKE ONE TABLET BY MOUTH DAILY., Disp: 30 tablet, Rfl: 6 .  lisinopril (PRINIVIL,ZESTRIL) 5 MG tablet, Take 1 tablet (5 mg total) by mouth daily., Disp: 90 tablet, Rfl: 3 .  potassium chloride SA (K-DUR,KLOR-CON) 20 MEQ tablet, TAKE ONE TABLET BY MOUTH DAILY., Disp: 30 tablet, Rfl: 6 .  tiotropium (SPIRIVA HANDIHALER) 18 MCG inhalation capsule, Place 1 capsule (18 mcg total) into inhaler and inhale daily., Disp: 30 capsule, Rfl: 12  Past Medical History: Past  Medical History:  Diagnosis Date  . Acid reflux   . Hypertension     Tobacco Use: History  Smoking Status  . Never Smoker  Smokeless Tobacco  . Never Used    Labs: Recent Review Flowsheet Data    Labs for ITP Cardiac and Pulmonary Rehab Latest Ref Rng & Units 01/05/2017 01/08/2017 01/10/2017 01/10/2017 02/17/2017   Cholestrol 0 - 200 mg/dL - 210(H) - - -   LDLCALC 0 - 99 mg/dL - 129(H) - - -   HDL >40 mg/dL - 57 - - -   Trlycerides <150 mg/dL - 118 - - -   Hemoglobin A1c 4.6 - 6.5 % 6.1(H) - - - 6.7(H)   PHART 7.350 - 7.450 - - 7.349(L) - -   PCO2ART 32.0 - 48.0 mmHg - - 68.5(HH) - -   HCO3 20.0 - 28.0 mmol/L - - 37.8(H) 39.3(H) -   TCO2 0 - 100 mmol/L - - 40 42 -   O2SAT % - - 94.0 56.0 -      Capillary Blood Glucose: Lab Results  Component Value Date   GLUCAP 99 11/05/2012     Exercise Target Goals:    Exercise Program Goal: Individual exercise prescription set with THRR, safety & activity barriers. Participant demonstrates ability to understand and report RPE using BORG scale, to self-measure pulse accurately, and to acknowledge the importance of the exercise prescription.  Exercise Prescription Goal: Starting with  aerobic activity 30 plus minutes a day, 3 days per week for initial exercise prescription. Provide home exercise prescription and guidelines that participant acknowledges understanding prior to discharge.  Activity Barriers & Risk Stratification:     Activity Barriers & Cardiac Risk Stratification - 04/14/17 1503      Activity Barriers & Cardiac Risk Stratification   Activity Barriers Shortness of Breath   Cardiac Risk Stratification High      6 Minute Walk:   Oxygen Initial Assessment:   Oxygen Re-Evaluation:   Oxygen Discharge (Final Oxygen Re-Evaluation):   Initial Exercise Prescription:     Initial Exercise Prescription - 04/14/17 1400      Date of Initial Exercise RX and Referring Provider   Date 04/14/17   Referring Provider Dr.  Harl Bowie     NuStep   Level 2   SPM 9   Minutes 15   METs 1.7     Arm Ergometer   Level 1.5   Watts 11   RPM 11   Minutes 20   METs 1.8     Prescription Details   Frequency (times per week) 3   Duration Progress to 30 minutes of continuous aerobic without signs/symptoms of physical distress     Intensity   THRR 40-80% of Max Heartrate 106-123-139   Ratings of Perceived Exertion 11-13   Perceived Dyspnea 0-4     Progression   Progression Continue progressive overload as per policy without signs/symptoms or physical distress.     Resistance Training   Training Prescription Yes   Weight 1   Reps 10-15      Perform Capillary Blood Glucose checks as needed.  Exercise Prescription Changes:     Exercise Prescription Changes    Row Name 04/25/17 1400             Response to Exercise   Blood Pressure (Admit) 130/72       Blood Pressure (Exercise) 154/76       Blood Pressure (Exit) 130/72       Heart Rate (Admit) 79 bpm       Heart Rate (Exercise) 94 bpm       Heart Rate (Exit) 83 bpm       Rating of Perceived Exertion (Exercise) 12         Resistance Training   Training Prescription Yes       Weight 1       Reps 10-15         NuStep   Level 1       SPM 12       Minutes 15       METs 3.74         Arm Ergometer   Level 1.7       Watts 14       RPM 14       Minutes 20       METs 2.1         Home Exercise Plan   Plans to continue exercise at Home (comment)       Frequency Add 2 additional days to program exercise sessions.          Exercise Comments:     Exercise Comments    Row Name 04/25/17 1452           Exercise Comments Patient is doing well in CR and is being closely monitored.           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        Intervention Provide advice, education, support and counseling about physical activity/exercise needs.;Develop an individualized exercise prescription for aerobic and resistive training based on initial evaluation findings, risk stratification, comorbidities and participant's personal goals.       Expected Outcomes Achievement of increased cardiorespiratory fitness and enhanced flexibility, muscular endurance and strength shown through measurements of functional capacity and personal statement of participant.       Increase Strength and Stamina Yes       Intervention Provide advice, education, support and counseling about physical activity/exercise needs.;Develop an individualized exercise prescription for aerobic and resistive training based on initial evaluation findings, risk stratification, comorbidities and participant's personal goals.       Expected Outcomes Achievement of increased cardiorespiratory fitness and enhanced flexibility, muscular endurance and strength shown through measurements of functional capacity and personal statement of participant.          Exercise Goals Re-Evaluation :    Discharge Exercise Prescription (Final Exercise Prescription Changes):     Exercise Prescription Changes - 04/25/17 1400      Response to Exercise   Blood Pressure (Admit) 130/72   Blood Pressure (Exercise) 154/76   Blood Pressure (Exit) 130/72   Heart Rate (Admit) 79 bpm   Heart Rate (Exercise) 94 bpm   Heart Rate (Exit) 83 bpm   Rating of Perceived Exertion (Exercise) 12     Resistance Training   Training Prescription Yes   Weight 1   Reps 10-15     NuStep   Level 1   SPM 12   Minutes 15   METs 3.74     Arm Ergometer   Level 1.7   Watts 14   RPM 14   Minutes 20   METs 2.1     Home Exercise Plan   Plans to continue exercise at Home (comment)   Frequency Add 2 additional days to program exercise sessions.      Nutrition:  Target Goals: Understanding of nutrition  guidelines, daily intake of sodium 1500mg , cholesterol 200mg , calories 30% from fat and 7% or less from saturated fats, daily to have 5 or more servings of fruits and vegetables.  Biometrics:     Pre Biometrics - 04/14/17 1441      Pre Biometrics   Height 5' 5.5" (1.664 m)   Waist Circumference 47 inches   Hip Circumference 51 inches   Waist to Hip Ratio 0.92 %   BMI (Calculated) 42.4   Triceps Skinfold 22 mm   % Body Fat 49.2 %   Grip Strength 46.33 kg   Flexibility 0 in   Single Leg Stand 2 seconds       Nutrition Therapy Plan and Nutrition Goals:   Nutrition Discharge: Rate Your Plate Scores:     Nutrition Assessments - 04/14/17 1506      MEDFICTS Scores   Pre Score 3      Nutrition Goals Re-Evaluation:   Nutrition Goals Discharge (Final Nutrition Goals Re-Evaluation):   Psychosocial: Target Goals: Acknowledge presence or absence of significant depression and/or stress, maximize coping skills, provide positive support system. Participant is able to verbalize types and ability to use techniques and skills needed for reducing stress  and depression.  Initial Review & Psychosocial Screening:     Initial Psych Review & Screening - 04/14/17 1510      Initial Review   Current issues with Current Depression     Family Dynamics   Good Support System? Yes     Barriers   Psychosocial barriers to participate in program The patient should benefit from training in stress management and relaxation.     Screening Interventions   Interventions Encouraged to exercise  Patient feels down at times related to her health. She feels is she could lose some weight and have more energy her psychosocial issues would be resolved.       Quality of Life Scores:     Quality of Life - 04/14/17 1442      Quality of Life Scores   Health/Function Pre 4.8 %   Socioeconomic Pre 12 %   Psych/Spiritual Pre 10.29 %   Family Pre 12 %   GLOBAL Pre 8.57 %      PHQ-9: Recent  Review Flowsheet Data    Depression screen [facility] 2/9 04/14/2017 02/17/2017   Decreased Interest 1 0   Down, Depressed, Hopeless 0 0   PHQ - 2 Score 1 0   Altered sleeping 1 -   Tired, decreased energy 3 -   Change in appetite 1 -   Feeling bad or failure about yourself  0 -   Trouble concentrating 1 -   Moving slowly or fidgety/restless 1 -   Suicidal thoughts 0 -   PHQ-9 Score 8 -   Difficult doing work/chores Very difficult -     Interpretation of Total Score  Total Score Depression Severity:  1-4 = Minimal depression, 5-9 = Mild depression, 10-14 = Moderate depression, 15-19 = Moderately severe depression, 20-27 = Severe depression   Psychosocial Evaluation and Intervention:     Psychosocial Evaluation - 04/14/17 1514      Psychosocial Evaluation & Interventions   Interventions Stress management education;Relaxation education;Encouraged to exercise with the program and follow exercise prescription   Comments Patient feels down at times related to her health. She feels is she could lose some weight and have more energy her psychosocial issues would be resolved. Her PHQ-9 score was 8 and her QOL score was 8.57 indicative of depression.   Expected Outcomes Patient will have increased energy at discharge with improved PHQ-9 and QOL scores.    Continue Psychosocial Services  Follow up required by staff      Psychosocial Re-Evaluation:   Psychosocial Discharge (Final Psychosocial Re-Evaluation):   Vocational Rehabilitation: Provide vocational rehab assistance to qualifying candidates.   Vocational Rehab Evaluation & Intervention:     Vocational Rehab - 04/14/17 1502      Initial Vocational Rehab Evaluation & Intervention   Assessment shows need for Vocational Rehabilitation No      Education: Education Goals: Education classes will be provided on a weekly basis, covering required topics. Participant will state understanding/return demonstration of topics  presented.  Learning Barriers/Preferences:     Learning Barriers/Preferences - 04/14/17 1502      Learning Barriers/Preferences   Learning Barriers None   Learning Preferences Skilled Demonstration;Pictoral;Video      Education Topics: Hypertension, Hypertension Reduction -Define heart disease and high blood pressure. Discus how high blood pressure affects the body and ways to reduce high blood pressure.   Exercise and Your Heart -Discuss why it is important to exercise, the FITT principles of exercise, normal and abnormal responses to exercise, and  how to exercise safely.   Angina -Discuss definition of angina, causes of angina, treatment of angina, and how to decrease risk of having angina.   CARDIAC REHAB PHASE II EXERCISE from 04/20/2017 in [place]  Date  04/20/17  Educator  DC  Instruction Review Code  2- meets goals/outcomes      Cardiac Medications -Review what the following cardiac medications are used for, how they affect the body, and side effects that may occur when taking the medications.  Medications include Aspirin, Beta blockers, calcium channel blockers, ACE Inhibitors, angiotensin receptor blockers, diuretics, digoxin, and antihyperlipidemics.   Congestive Heart Failure -Discuss the definition of CHF, how to live with CHF, the signs and symptoms of CHF, and how keep track of weight and sodium intake.   Heart Disease and Intimacy -Discus the effect sexual activity has on the heart, how changes occur during intimacy as we age, and safety during sexual activity.   Smoking Cessation / COPD -Discuss different methods to quit smoking, the health benefits of quitting smoking, and the definition of COPD.   Nutrition I: Fats -Discuss the types of cholesterol, what cholesterol does to the heart, and how cholesterol levels can be controlled.   Nutrition II: Labels -Discuss the different components of food labels and how to read food  label   Heart Parts and Heart Disease -Discuss the anatomy of the heart, the pathway of blood circulation through the heart, and these are affected by heart disease.   Stress I: Signs and Symptoms -Discuss the causes of stress, how stress may lead to anxiety and depression, and ways to limit stress.   Stress II: Relaxation -Discuss different types of relaxation techniques to limit stress.   Warning Signs of Stroke / TIA -Discuss definition of a stroke, what the signs and symptoms are of a stroke, and how to identify when someone is having stroke.   Knowledge Questionnaire Score:     Knowledge Questionnaire Score - 04/14/17 1502      Knowledge Questionnaire Score   Pre Score 20/24      Core Components/Risk Factors/Patient Goals at Admission:     Personal Goals and Risk Factors at Admission - 04/14/17 1506      Core Components/Risk Factors/Patient Goals on Admission    Weight Management Obesity;Yes   Intervention Weight Management: Develop a combined nutrition and exercise program designed to reach desired caloric intake, while maintaining appropriate intake of nutrient and fiber, sodium and fats, and appropriate energy expenditure required for the weight goal.   Admit Weight 258 lb (117 kg)   Goal Weight: Short Term 253 lb (114.8 kg)   Goal Weight: Long Term 243 lb (110.2 kg)   Expected Outcomes Short Term: Continue to assess and modify interventions until short term weight is achieved;Long Term: Adherence to nutrition and physical activity/exercise program aimed toward attainment of established weight goal   Improve shortness of breath with ADL's Yes   Intervention Provide education, individualized exercise plan and daily activity instruction to help decrease symptoms of SOB with activities of daily living.   Expected Outcomes Short Term: Achieves a reduction of symptoms when performing activities of daily living.   Develop more efficient breathing techniques such as purse  lipped breathing and diaphragmatic breathing; and practicing self-pacing with activity Yes   Intervention Provide education, demonstration and support about specific breathing techniuqes utilized for more efficient breathing. Include techniques such as pursed lipped breathing, diaphragmatic breathing and self-pacing activity.   Expected Outcomes Short Term:  Participant will be able to demonstrate and use breathing techniques as needed throughout daily activities.   Heart Failure Yes   Intervention Provide a combined exercise and nutrition program that is supplemented with education, support and counseling about heart failure. Directed toward relieving symptoms such as shortness of breath, decreased exercise tolerance, and extremity edema.   Expected Outcomes Improve functional capacity of life;Short term: Attendance in program 2-3 days a week with increased exercise capacity. Reported lower sodium intake. Reported increased fruit and vegetable intake. Reports medication compliance.   Personal Goal Other Yes   Personal Goal Lose 50 lbs long term. Eat right   Intervention Patient will attend CR 3 days/week and supplement with exercise at home 2 days/week.   Expected Outcomes Patient will meet her personal goals.      Core Components/Risk Factors/Patient Goals Review:    Core Components/Risk Factors/Patient Goals at Discharge (Final Review):    ITP Comments:     ITP Comments    Row Name 04/27/17 1406           ITP Comments Patient new to program completing 5 sessions. She says she already feels better with decreased pain in her knees. Will continue to monitor for progress.           Comments: ITP 30 Day REVIEW Patient new to the program completing 5 sessions. Will continue to monitor for progress.

## 2017-04-28 ENCOUNTER — Telehealth: Payer: Self-pay | Admitting: *Deleted

## 2017-04-28 NOTE — Telephone Encounter (Signed)
Home health nurse called to get clarification on pulse ox that was done at last office visit and needed to know if the pulse oximetry that was done that read 88%, if it was done on room air was it done at rest. Per Vaughan Basta, the oxygen orders have been done by patient's PCP (Dr. Luan Pulling). Per Vaughan Basta, an addendum to the 04/07/17 office visit with this information is needed to help this patient qualify for oxygen. Acquanetta Chain LPN was contacted to verify this information. Per Kirke Shaggy, the pulse ox was 88% on room air and at rest during the office visit.

## 2017-04-29 ENCOUNTER — Telehealth: Payer: Self-pay | Admitting: *Deleted

## 2017-04-29 ENCOUNTER — Encounter (HOSPITAL_COMMUNITY)
Admission: RE | Admit: 2017-04-29 | Discharge: 2017-04-29 | Disposition: A | Discharge status: Home / Self Care | Payer: Medicare Other | Source: Ambulatory Visit | Attending: Cardiology | Admitting: Cardiology

## 2017-04-29 DIAGNOSIS — I5022 Chronic systolic (congestive) heart failure: Secondary | ICD-10-CM | POA: Diagnosis not present

## 2017-04-29 NOTE — Progress Notes (Signed)
Daily Session Note  Patient Details  Name: Pamela Soto MRN: [id number] Date of Birth: [birth year] Referring Provider:     CARDIAC REHAB PHASE II ORIENTATION from 04/14/2017 in [place]  Referring Provider  Dr. Harl Bowie      Encounter Date: 04/29/2017  Check In:     Session Check In - 04/29/17 1100      Check-In   Location [facility]   Staff Present Jermaine Tholl Angelina Pih, MS, EP, [facility], Exercise Physiologist;Gregory Luther Parody, BS, EP, Exercise Physiologist   Supervising physician immediately available to respond to emergencies See telemetry face sheet for immediately available MD   Medication changes reported     No   Fall or balance concerns reported    No   Tobacco Cessation No Change   Warm-up and Cool-down Performed as group-led instruction   Resistance Training Performed Yes   VAD Patient? No     Pain Assessment   Currently in Pain? No/denies   Pain Score 0-No pain   Multiple Pain Sites No      Capillary Blood Glucose: No results found for this or any previous visit (from the past 24 hour(s)).    History  Smoking Status  . Never Smoker  Smokeless Tobacco  . Never Used    Goals Met:  Independence with exercise equipment Exercise tolerated well No report of cardiac concerns or symptoms Strength training completed today  Goals Unmet:  Not Applicable  Comments: Check out: 12:00   Dr. Kate Sable is Medical Director for [place] and Pulmonary Rehab.

## 2017-04-29 NOTE — Telephone Encounter (Signed)
Pt aware - says that Dr Luan Pulling office had ordered CPAP machine which she hasn't received yet (says that medicaid wouldn't approve) pt contacted Dr Luan Pulling office regarding this 2 days ago and waiting for response.

## 2017-04-29 NOTE — Telephone Encounter (Signed)
-----   Message from Arnoldo Lenis, MD sent at 04/28/2017 11:02 AM EDT ----- Will discuss at our upcoming follow up. Strongly encourage compliance with her inhalers, she has severe COPD  J BrancH MD ----- Message ----- From: Dwana Melena, RN Sent: 04/27/2017   1:53 PM To: Arnoldo Lenis, MD  Dr. Harl Bowie,  Pamela Soto is a patient in Cardiac Rehab. She has an appointment with you 05/10/17. She is complaining of increased SOB over the last week. Her weight has fluctuated 2 to 3 lbs. She says she feels her furosemide is not working as well based on her not urinating as much. She also says she does not use her inhalers as prescribed for her COPD.   Just FYI.   Lisabeth Register, RN

## 2017-05-02 ENCOUNTER — Encounter (HOSPITAL_COMMUNITY): Payer: Medicare Other

## 2017-05-04 ENCOUNTER — Encounter (HOSPITAL_COMMUNITY)
Admission: RE | Admit: 2017-05-04 | Discharge: 2017-05-04 | Disposition: A | Discharge status: Home / Self Care | Payer: Medicare Other | Source: Ambulatory Visit | Attending: Cardiology | Admitting: Cardiology

## 2017-05-04 DIAGNOSIS — I5022 Chronic systolic (congestive) heart failure: Secondary | ICD-10-CM | POA: Diagnosis not present

## 2017-05-04 NOTE — Progress Notes (Signed)
Daily Session Note  Patient Details  Name: Pamela Soto MRN: [id number] Date of Birth: 07/12/1951 Referring Provider:     CARDIAC REHAB PHASE II ORIENTATION from 04/14/2017 in [place]  Referring Provider  Dr. Harl Bowie      Encounter Date: 05/04/2017  Check In:     Session Check In - 05/04/17 1117      Check-In   Location [facility]   Staff Present Aundra Dubin, RN, BSN;Syair Fricker Luther Parody, BS, EP, Exercise Physiologist   Supervising physician immediately available to respond to emergencies See telemetry face sheet for immediately available MD   Medication changes reported     No   Fall or balance concerns reported    No   Warm-up and Cool-down Performed as group-led instruction   Resistance Training Performed Yes   VAD Patient? No     Pain Assessment   Currently in Pain? No/denies   Pain Score 0-No pain   Multiple Pain Sites No      Capillary Blood Glucose: No results found for this or any previous visit (from the past 24 hour(s)).    History  Smoking Status  . Never Smoker  Smokeless Tobacco  . Never Used    Goals Met:  Independence with exercise equipment Exercise tolerated well No report of cardiac concerns or symptoms Strength training completed today  Goals Unmet:  Not Applicable  Comments: Check out 1200   Dr. Kate Sable is Medical Director for [place] and Pulmonary Rehab.

## 2017-05-05 ENCOUNTER — Encounter (HOSPITAL_COMMUNITY): Payer: Medicare Other

## 2017-05-06 ENCOUNTER — Encounter (HOSPITAL_COMMUNITY)
Admission: RE | Admit: 2017-05-06 | Discharge: 2017-05-06 | Disposition: A | Discharge status: Home / Self Care | Payer: Medicare Other | Source: Ambulatory Visit | Attending: Cardiology | Admitting: Cardiology

## 2017-05-06 DIAGNOSIS — I5022 Chronic systolic (congestive) heart failure: Secondary | ICD-10-CM | POA: Insufficient documentation

## 2017-05-06 NOTE — Progress Notes (Signed)
Daily Session Note  Patient Details  Name: VIVION ROMANO MRN: [id number] Date of Birth: 11-24-1951 Referring Provider:     CARDIAC REHAB PHASE II ORIENTATION from 04/14/2017 in [place]  Referring Provider  Dr. Harl Bowie      Encounter Date: 05/06/2017  Check In:     Session Check In - 05/06/17 1050      Check-In   Location [facility]   Staff Present Aundra Dubin, RN, BSN;Chanel Mcadams Luther Parody, BS, EP, Exercise Physiologist   Supervising physician immediately available to respond to emergencies See telemetry face sheet for immediately available MD   Medication changes reported     No   Fall or balance concerns reported    No   Warm-up and Cool-down Performed as group-led instruction   Resistance Training Performed Yes   VAD Patient? No     Pain Assessment   Currently in Pain? No/denies   Pain Score 0-No pain   Multiple Pain Sites No      Capillary Blood Glucose: No results found for this or any previous visit (from the past 24 hour(s)).    History  Smoking Status  . Never Smoker  Smokeless Tobacco  . Never Used    Goals Met:  Independence with exercise equipment Exercise tolerated well No report of cardiac concerns or symptoms Strength training completed today  Goals Unmet:  Not Applicable  Comments: Check out 1200   Dr. Kate Sable is Medical Director for [place] and Pulmonary Rehab.

## 2017-05-09 ENCOUNTER — Encounter (HOSPITAL_COMMUNITY)
Admission: RE | Admit: 2017-05-09 | Discharge: 2017-05-09 | Disposition: A | Discharge status: Home / Self Care | Payer: Medicare Other | Source: Ambulatory Visit | Attending: Cardiology | Admitting: Cardiology

## 2017-05-09 DIAGNOSIS — I5022 Chronic systolic (congestive) heart failure: Secondary | ICD-10-CM

## 2017-05-09 NOTE — Progress Notes (Signed)
Daily Session Note  Patient Details  Name: Pamela Soto MRN: [id number] Date of Birth: 1951/08/20 Referring Provider:     CARDIAC REHAB PHASE II ORIENTATION from 04/14/2017 in [place]  Referring Provider  Dr. Harl Bowie      Encounter Date: 05/09/2017  Check In:     Session Check In - 05/09/17 1100      Check-In   Location [facility]   Staff Present Aundra Dubin, RN, BSN;Gregory Luther Parody, BS, EP, Exercise Physiologist   Supervising physician immediately available to respond to emergencies See telemetry face sheet for immediately available MD   Medication changes reported     No   Fall or balance concerns reported    No   Warm-up and Cool-down Performed as group-led instruction   Resistance Training Performed Yes   VAD Patient? No     Pain Assessment   Currently in Pain? No/denies   Pain Score 0-No pain   Multiple Pain Sites No      Capillary Blood Glucose: No results found for this or any previous visit (from the past 24 hour(s)).    History  Smoking Status  . Never Smoker  Smokeless Tobacco  . Never Used    Goals Met:  Independence with exercise equipment Exercise tolerated well No report of cardiac concerns or symptoms Strength training completed today  Goals Unmet:  Not Applicable  Comments: Check out 1200.   Dr. Kate Sable is Medical Director for [facility] and [facility].

## 2017-05-10 ENCOUNTER — Ambulatory Visit: Payer: Medicare Other | Admitting: Cardiology

## 2017-05-10 ENCOUNTER — Encounter: Payer: Self-pay | Admitting: *Deleted

## 2017-05-10 NOTE — Progress Notes (Deleted)
Clinical Summary Pamela Soto is a 66 y.o.female seen today for follow up of the following medical problems.   1. NICM/Chronic systolic HF/NSTEMI - admit Jan 2018 with CHF - echo at that time showed LVEF 35-40%, a new finding for the patient.  - referred for cath. Feb 2018 cath: distal LAD 100%, Prox RCA 40%, OM3 40%. RHC mean PA 37, PCWP 26, CI 1.73. CAD medically managed.    - home weights 254 lbs. Limiting salt intake - compliant with meds - on plavix since recent cath. There was some concern that the distal LAD lesion was acute, and thus plavix was started.  - can have some orthoststatic symptoms - cardiac rehab to start end of May.  2. COPD - 03/2017 PFTs: severe COPD - started on spiriva - followed by Dr Luan Pulling  3. Afib - no recent palpitations - denies any bleeding on anticoag   4. Hyperlipidemia - reports nonspecific neck and shoulder pains, unclear if related to statin.   - somewhat improved with lower statin dose  5. DM2 - followed by pcp - last HgbA1c  was 6.7  6. OSA - sleep study 03/2017 with severe OSA. Recs for bipap as cpap was unsuccesful.  - awaiting machine    Past Medical History:  Diagnosis Date  . Acid reflux   . Hypertension      No Known Allergies   Current Outpatient Prescriptions  Medication Sig Dispense Refill  . acetaminophen (TYLENOL) 500 MG tablet Take 1 tablet (500 mg total) by mouth every 6 (six) hours as needed. 30 tablet 0  . albuterol (PROVENTIL) (2.5 MG/3ML) 0.083% nebulizer solution Take 3 mLs (2.5 mg total) by nebulization every 6 (six) hours as needed for wheezing or shortness of breath. 75 mL 12  . atorvastatin (LIPITOR) 20 MG tablet Take 1 tablet (20 mg total) by mouth daily. 90 tablet 1  . bisoprolol (ZEBETA) 5 MG tablet Take 1 tablet (5 mg total) by mouth daily. 90 tablet 1  . clopidogrel (PLAVIX) 75 MG tablet TAKE ONE TABLET BY MOUTH DAILY. 30 tablet 6  . Coenzyme Q10 (CO Q-10) 50 MG CAPS Take 1  capsule by mouth 2 (two) times daily after a meal. 60 each 0  . ELIQUIS 5 MG TABS tablet TAKE ONE TABLET BY MOUTH TWICE DAILY. 60 tablet 3  . furosemide (LASIX) 40 MG tablet TAKE ONE TABLET BY MOUTH DAILY. 30 tablet 6  . lisinopril (PRINIVIL,ZESTRIL) 5 MG tablet Take 1 tablet (5 mg total) by mouth daily. 90 tablet 3  . potassium chloride SA (K-DUR,KLOR-CON) 20 MEQ tablet TAKE ONE TABLET BY MOUTH DAILY. 30 tablet 6  . tiotropium (SPIRIVA HANDIHALER) 18 MCG inhalation capsule Place 1 capsule (18 mcg total) into inhaler and inhale daily. 30 capsule 12   No current facility-administered medications for this visit.      Past Surgical History:  Procedure Laterality Date  . BREAST BIOPSY Right    normal result  . CESAREAN SECTION    . CHOLECYSTECTOMY  11/06/2012   Procedure: LAPAROSCOPIC CHOLECYSTECTOMY WITH INTRAOPERATIVE CHOLANGIOGRAM;  Surgeon: Pedro Earls, MD;  Location: [facility];  Service: General;  Laterality: N/A;  . RIGHT/LEFT HEART CATH AND CORONARY ANGIOGRAPHY N/A 01/10/2017   Procedure: Right/Left Heart Cath and Coronary Angiography;  Surgeon: Belva Crome, MD;  Location: [facility];  Service: Cardiovascular;  Laterality: N/A;  . UMBILICAL HERNIA REPAIR  11/06/2012   Procedure: HERNIA REPAIR UMBILICAL ADULT;  Surgeon: Pedro Earls,  MD;  Location: [facility];  Service: General;;     No Known Allergies    Family History  Problem Relation Age of Onset  . Heart disease Mother 76  . Heart attack Mother   . Asthma Father   . Cancer Father        pancreastic cancer  . Heart attack Brother 48  . Cancer Sister        unsure     Social History Pamela Soto reports that she has never smoked. She has never used smokeless tobacco. Pamela Soto reports that she does not drink alcohol.   Review of Systems CONSTITUTIONAL: No weight loss, fever, chills, weakness or fatigue.  HEENT: Eyes: No visual loss, blurred vision, double vision or yellow sclerae.No hearing loss, sneezing,  congestion, runny nose or sore throat.  SKIN: No rash or itching.  CARDIOVASCULAR:  RESPIRATORY: No shortness of breath, cough or sputum.  GASTROINTESTINAL: No anorexia, nausea, vomiting or diarrhea. No abdominal pain or blood.  GENITOURINARY: No burning on urination, no polyuria NEUROLOGICAL: No headache, dizziness, syncope, paralysis, ataxia, numbness or tingling in the extremities. No change in bowel or bladder control.  MUSCULOSKELETAL: No muscle, back pain, joint pain or stiffness.  LYMPHATICS: No enlarged nodes. No history of splenectomy.  PSYCHIATRIC: No history of depression or anxiety.  ENDOCRINOLOGIC: No reports of sweating, cold or heat intolerance. No polyuria or polydipsia.  [name]   Physical Examination There were no vitals filed for this visit. There were no vitals filed for this visit.  Gen: resting comfortably, no acute distress HEENT: no scleral icterus, pupils equal round and reactive, no palptable cervical adenopathy,  CV Resp: Clear to auscultation bilaterally GI: abdomen is soft, non-tender, non-distended, normal bowel sounds, no hepatosplenomegaly MSK: extremities are warm, no edema.  Skin: warm, no rash Neuro:  no focal deficits Psych: appropriate affect   Diagnostic Studies 01/2017 cath  Dist LAD lesion, 100 %stenosed.  Prox RCA lesion, 40 %stenosed.  3rd Mrg lesion, 40 %stenosed.  There is severe left ventricular systolic dysfunction.  The left ventricular ejection fraction is less than 25% by visual estimate.  LV end diastolic pressure is severely elevated.  Hemodynamic findings consistent with moderate pulmonary hypertension.   Acute atrial fibrillation developed on the cath table during right heart/right atrial pressure recording. Rate 118 bpm. Atrial fibrillation was treated with one bolus of IV amiodarone.  40% proximal RCA stenosis.  Tortuous but normal circumflex  100% distal/apical LAD stenosis. This lesion is probably chronic but  difficult to determine age. There is also a possibility that it is acute embolic occlusion from a catheter-based thrombus. For that reason additional heparin and then Aggrastat bolus was given as the patient complained of chest pressure post procedure.  Chest pressure post procedure is similar to prior episodes.  Severe LV dysfunction with EF less than 25%. Contraction pattern appears global.  Arterial blood gas demonstrated severe CO2 retention, pCO2 67.    01/2017 echo Study Conclusions  - Left ventricle: The cavity size was mildly dilated. Wall thickness was increased in a pattern of moderate LVH. Systolic function was moderately reduced. The estimated ejection fraction was in the range of 35% to 40%. Diffuse hypokinesis. - Aortic valve: Mildly calcified annulus. Trileaflet; mildly thickened leaflets. - Mitral valve: There was mild regurgitation. - Left atrium: The atrium was moderately dilated. - Atrial septum: The septum bowed from left to right, consistent with increased left atrial pressure. - Pulmonic valve: There was mild regurgitation.    Assessment  and Plan  1. CAD/Chronic systolic HF/NSTEMI - symptoms improving. We will increase bisoprolol to 5mg  daily.  - continue plavix s/p recent NSTEMI  2. Afib -  Continue current meds. CHADS2Vasc score is 5, continue eliquis  3. Hyperlipidemia - nonspecific muscle aches, we will try lowering atorva to 20mg    . 4. Hypoxia - office O2 sat at rest on room air is 88%, will need to be considered for home O2.       Arnoldo Lenis, M.D., F.A.C.C.

## 2017-05-11 ENCOUNTER — Encounter (HOSPITAL_COMMUNITY)
Admission: RE | Admit: 2017-05-11 | Discharge: 2017-05-11 | Disposition: A | Discharge status: Home / Self Care | Payer: Medicare Other | Source: Ambulatory Visit | Attending: Cardiology | Admitting: Cardiology

## 2017-05-11 DIAGNOSIS — I5022 Chronic systolic (congestive) heart failure: Secondary | ICD-10-CM | POA: Diagnosis not present

## 2017-05-11 NOTE — Progress Notes (Signed)
Daily Session Note  Patient Details  Name: FARTUN PARADISO MRN: [id number] Date of Birth: 06-24-1951 Referring Provider:     CARDIAC REHAB PHASE II ORIENTATION from 04/14/2017 in [place]  Referring Provider  Dr. Harl Bowie      Encounter Date: 05/11/2017  Check In:     Session Check In - 05/11/17 1110      Check-In   Location [facility]   Staff Present Aundra Dubin, RN, BSN;Gunter Conde Luther Parody, BS, EP, Exercise Physiologist   Supervising physician immediately available to respond to emergencies See telemetry face sheet for immediately available MD   Medication changes reported     No   Fall or balance concerns reported    No   Warm-up and Cool-down Performed as group-led instruction   Resistance Training Performed Yes   VAD Patient? No     Pain Assessment   Currently in Pain? No/denies   Pain Score 0-No pain   Multiple Pain Sites No      Capillary Blood Glucose: No results found for this or any previous visit (from the past 24 hour(s)).    History  Smoking Status  . Never Smoker  Smokeless Tobacco  . Never Used    Goals Met:  Independence with exercise equipment Exercise tolerated well No report of cardiac concerns or symptoms Strength training completed today  Goals Unmet:  Not Applicable  Comments: Check out 1200   Dr. Kate Sable is Medical Director for [facility] and Pulmonary Re[facility].

## 2017-05-12 ENCOUNTER — Ambulatory Visit: Payer: Medicare Other | Admitting: Cardiology

## 2017-05-12 NOTE — Progress Notes (Deleted)
Clinical Summary Pamela Soto is a 66 y.o.female seen today for follow up of the following medical problems.   1. NICM/Chronic systolic HF/NSTEMI - admit Jan 2018 with CHF - echo at that time showed LVEF 35-40%, a new finding for the patient.  - referred for cath. Feb 2018 cath: distal LAD100%, Prox RCA 40%, OM3 40%. RHC mean PA 37, PCWP 26, CI 1.73. CAD medically managed.    - home weights 254 lbs. Limiting salt intake - compliant with meds - on plavix since recent cath. There was some concern that the distal LAD lesion was acute, and thus plavix was started.  - can have some orthoststatic symptoms - cardiac rehab to start end of May.  2. COPD - 03/2017 PFTs: severe COPD - started on spiriva - followed by Dr Luan Pulling  3. Afib - no recent palpitations -denies any bleeding on anticoag   4. Hyperlipidemia - reports nonspecific neck and shoulder pains, unclear if related to statin.   - somewhat improved with lower statin dose  5. DM2 - followed by pcp - last HgbA1c was 6.7  6. OSA - sleep study 03/2017 with severe OSA. Recs for bipap as cpap was unsuccesful.  - awaiting machine     Past Medical History:  Diagnosis Date  . Acid reflux   . Hypertension      No Known Allergies   Current Outpatient Prescriptions  Medication Sig Dispense Refill  . acetaminophen (TYLENOL) 500 MG tablet Take 1 tablet (500 mg total) by mouth every 6 (six) hours as needed. 30 tablet 0  . albuterol (PROVENTIL) (2.5 MG/3ML) 0.083% nebulizer solution Take 3 mLs (2.5 mg total) by nebulization every 6 (six) hours as needed for wheezing or shortness of breath. 75 mL 12  . atorvastatin (LIPITOR) 20 MG tablet Take 1 tablet (20 mg total) by mouth daily. 90 tablet 1  . bisoprolol (ZEBETA) 5 MG tablet Take 1 tablet (5 mg total) by mouth daily. 90 tablet 1  . clopidogrel (PLAVIX) 75 MG tablet TAKE ONE TABLET BY MOUTH DAILY. 30 tablet 6  . Coenzyme Q10 (CO Q-10) 50 MG CAPS Take 1  capsule by mouth 2 (two) times daily after a meal. 60 each 0  . ELIQUIS 5 MG TABS tablet TAKE ONE TABLET BY MOUTH TWICE DAILY. 60 tablet 3  . furosemide (LASIX) 40 MG tablet TAKE ONE TABLET BY MOUTH DAILY. 30 tablet 6  . lisinopril (PRINIVIL,ZESTRIL) 5 MG tablet Take 1 tablet (5 mg total) by mouth daily. 90 tablet 3  . potassium chloride SA (K-DUR,KLOR-CON) 20 MEQ tablet TAKE ONE TABLET BY MOUTH DAILY. 30 tablet 6  . tiotropium (SPIRIVA HANDIHALER) 18 MCG inhalation capsule Place 1 capsule (18 mcg total) into inhaler and inhale daily. 30 capsule 12   No current facility-administered medications for this visit.      Past Surgical History:  Procedure Laterality Date  . BREAST BIOPSY Right    normal result  . CESAREAN SECTION    . CHOLECYSTECTOMY  11/06/2012   Procedure: LAPAROSCOPIC CHOLECYSTECTOMY WITH INTRAOPERATIVE CHOLANGIOGRAM;  Surgeon: Pedro Earls, MD;  Location: [facility];  Service: General;  Laterality: N/A;  . RIGHT/LEFT HEART CATH AND CORONARY ANGIOGRAPHY N/A 01/10/2017   Procedure: Right/Left Heart Cath and Coronary Angiography;  Surgeon: Belva Crome, MD;  Location: [facility];  Service: Cardiovascular;  Laterality: N/A;  . UMBILICAL HERNIA REPAIR  11/06/2012   Procedure: HERNIA REPAIR UMBILICAL ADULT;  Surgeon: Pedro Earls, MD;  Location: [facility];  Service: General;;     No Known Allergies    Family History  Problem Relation Age of Onset  . Heart disease Mother 91  . Heart attack Mother   . Asthma Father   . Cancer Father        pancreastic cancer  . Heart attack Brother 80  . Cancer Sister        unsure     Social History Pamela Soto reports that she has never smoked. She has never used smokeless tobacco. Pamela Soto reports that she does not drink alcohol.   Review of Systems CONSTITUTIONAL: No weight loss, fever, chills, weakness or fatigue.  HEENT: Eyes: No visual loss, blurred vision, double vision or yellow sclerae.No hearing loss, sneezing,  congestion, runny nose or sore throat.  SKIN: No rash or itching.  CARDIOVASCULAR:  RESPIRATORY: No shortness of breath, cough or sputum.  GASTROINTESTINAL: No anorexia, nausea, vomiting or diarrhea. No abdominal pain or blood.  GENITOURINARY: No burning on urination, no polyuria NEUROLOGICAL: No headache, dizziness, syncope, paralysis, ataxia, numbness or tingling in the extremities. No change in bowel or bladder control.  MUSCULOSKELETAL: No muscle, back pain, joint pain or stiffness.  LYMPHATICS: No enlarged nodes. No history of splenectomy.  PSYCHIATRIC: No history of depression or anxiety.  ENDOCRINOLOGIC: No reports of sweating, cold or heat intolerance. No polyuria or polydipsia.  [name]   Physical Examination There were no vitals filed for this visit. There were no vitals filed for this visit.  Gen: resting comfortably, no acute distress HEENT: no scleral icterus, pupils equal round and reactive, no palptable cervical adenopathy,  CV Resp: Clear to auscultation bilaterally GI: abdomen is soft, non-tender, non-distended, normal bowel sounds, no hepatosplenomegaly MSK: extremities are warm, no edema.  Skin: warm, no rash Neuro:  no focal deficits Psych: appropriate affect   Diagnostic Studies 01/2017 cath  Dist LAD lesion, 100 %stenosed.  Prox RCA lesion, 40 %stenosed.  3rd Mrg lesion, 40 %stenosed.  There is severe left ventricular systolic dysfunction.  The left ventricular ejection fraction is less than 25% by visual estimate.  LV end diastolic pressure is severely elevated.  Hemodynamic findings consistent with moderate pulmonary hypertension.   Acute atrial fibrillation developed on the cath table during right heart/right atrial pressure recording. Rate 118 bpm. Atrial fibrillation was treated with one bolus of IV amiodarone.  40% proximal RCA stenosis.  Tortuous but normal circumflex  100% distal/apical LAD stenosis. This lesion is probably chronic but  difficult to determine age. There is also a possibility that it is acute embolic occlusion from a catheter-based thrombus. For that reason additional heparin and then Aggrastat bolus was given as the patient complained of chest pressure post procedure.  Chest pressure post procedure is similar to prior episodes.  Severe LV dysfunction with EF less than 25%. Contraction pattern appears global.  Arterial blood gas demonstrated severe CO2 retention, pCO2 67.    01/2017 echo Study Conclusions  - Left ventricle: The cavity size was mildly dilated. Wall thickness was increased in a pattern of moderate LVH. Systolic function was moderately reduced. The estimated ejection fraction was in the range of 35% to 40%. Diffuse hypokinesis. - Aortic valve: Mildly calcified annulus. Trileaflet; mildly thickened leaflets. - Mitral valve: There was mild regurgitation. - Left atrium: The atrium was moderately dilated. - Atrial septum: The septum bowed from left to right, consistent with increased left atrial pressure. - Pulmonic valve: There was mild regurgitation.    Assessment and Plan  1. CAD/Chronic systolic HF/NSTEMI - symptoms improving. We will increase bisoprolol to 5mg  daily.  - continue plavix s/p recent NSTEMI  2. Afib - Continue current meds. CHADS2Vasc score is 5, continue eliquis  3. Hyperlipidemia - nonspecific muscle aches, we will try lowering atorva to 20mg    . 4. Hypoxia - office O2 sat at rest on room airis 88%, will need to be considered for home O2.       Arnoldo Lenis, M.D., F.A.C.C.

## 2017-05-13 ENCOUNTER — Encounter (HOSPITAL_COMMUNITY)
Admission: RE | Admit: 2017-05-13 | Discharge: 2017-05-13 | Disposition: A | Discharge status: Home / Self Care | Payer: Medicare Other | Source: Ambulatory Visit | Attending: Cardiology | Admitting: Cardiology

## 2017-05-13 ENCOUNTER — Encounter: Payer: Self-pay | Admitting: Cardiology

## 2017-05-13 DIAGNOSIS — I5022 Chronic systolic (congestive) heart failure: Secondary | ICD-10-CM

## 2017-05-13 NOTE — Progress Notes (Signed)
Daily Session Note  Patient Details  Name: Pamela Soto MRN: [id number] Date of Birth: 11-Apr-1951 Referring Provider:     CARDIAC REHAB PHASE II ORIENTATION from 04/14/2017 in [place]  Referring Provider  Dr. Harl Bowie      Encounter Date: 05/13/2017  Check In:     Session Check In - 05/13/17 1107      Check-In   Location [facility]   Staff Present Aundra Dubin, RN, BSN;Steve Gregg Luther Parody, BS, EP, Exercise Physiologist   Supervising physician immediately available to respond to emergencies See telemetry face sheet for immediately available MD   Medication changes reported     No   Fall or balance concerns reported    No   Warm-up and Cool-down Performed as group-led instruction   Resistance Training Performed Yes   VAD Patient? No     Pain Assessment   Currently in Pain? No/denies   Pain Score 0-No pain   Multiple Pain Sites No      Capillary Blood Glucose: No results found for this or any previous visit (from the past 24 hour(s)).    History  Smoking Status  . Never Smoker  Smokeless Tobacco  . Never Used    Goals Met:  Independence with exercise equipment Exercise tolerated well No report of cardiac concerns or symptoms Strength training completed today  Goals Unmet:  Not Applicable  Comments: Check out 1200   Dr. Kate Sable is Medical Director for [facility] and Pulmonary Rehab.

## 2017-05-16 ENCOUNTER — Encounter (HOSPITAL_COMMUNITY): Payer: Medicare Other

## 2017-05-18 ENCOUNTER — Encounter (HOSPITAL_COMMUNITY)
Admission: RE | Admit: 2017-05-18 | Discharge: 2017-05-18 | Disposition: A | Discharge status: Home / Self Care | Payer: Medicare Other | Source: Ambulatory Visit | Attending: Cardiology | Admitting: Cardiology

## 2017-05-18 DIAGNOSIS — I5022 Chronic systolic (congestive) heart failure: Secondary | ICD-10-CM

## 2017-05-18 NOTE — Progress Notes (Signed)
Daily Session Note  Patient Details  Name: Pamela Soto MRN: [id number] Date of Birth: 07-11-1951 Referring Provider:     CARDIAC REHAB PHASE II ORIENTATION from 04/14/2017 in [place]  Referring Provider  Dr. Harl Bowie      Encounter Date: 05/18/2017  Check In:     Session Check In - 05/18/17 1056      Check-In   Location [facility]   Staff Present Diane Angelina Pih, MS, EP, [facility], Exercise Physiologist;Debra Wynetta Emery, RN, BSN;Miryah Ralls, BS, EP, Exercise Physiologist   Supervising physician immediately available to respond to emergencies See telemetry face sheet for immediately available MD   Medication changes reported     No   Fall or balance concerns reported    No   Warm-up and Cool-down Performed as group-led instruction   Resistance Training Performed Yes   VAD Patient? No     Pain Assessment   Currently in Pain? No/denies   Pain Score 0-No pain   Multiple Pain Sites No      Capillary Blood Glucose: No results found for this or any previous visit (from the past 24 hour(s)).    History  Smoking Status  . Never Smoker  Smokeless Tobacco  . Never Used    Goals Met:  Independence with exercise equipment Exercise tolerated well No report of cardiac concerns or symptoms Strength training completed today  Goals Unmet:  Not Applicable  Comments: Check out 1200   Dr. Kate Sable is Medical Director for [facility] and Pulmonary Rehab.

## 2017-05-20 ENCOUNTER — Encounter (HOSPITAL_COMMUNITY)
Admission: RE | Admit: 2017-05-20 | Discharge: 2017-05-20 | Disposition: A | Discharge status: Home / Self Care | Payer: Medicare Other | Source: Ambulatory Visit | Attending: Cardiology | Admitting: Cardiology

## 2017-05-20 DIAGNOSIS — I5022 Chronic systolic (congestive) heart failure: Secondary | ICD-10-CM | POA: Diagnosis not present

## 2017-05-20 NOTE — Progress Notes (Signed)
Daily Session Note  Patient Details  Name: Pamela Soto MRN: [id number] Date of Birth: December 11, 1950 Referring Provider:     CARDIAC REHAB PHASE II ORIENTATION from 04/14/2017 in [place]  Referring Provider  Dr. Harl Bowie      Encounter Date: 05/20/2017  Check In:     Session Check In - 05/20/17 1057      Check-In   Location [facility]   Staff Present Aundra Dubin, RN, BSN;Sutton Plake Luther Parody, BS, EP, Exercise Physiologist   Supervising physician immediately available to respond to emergencies See telemetry face sheet for immediately available MD   Medication changes reported     No   Fall or balance concerns reported    No   Warm-up and Cool-down Performed as group-led instruction   Resistance Training Performed Yes   VAD Patient? No     Pain Assessment   Currently in Pain? No/denies   Pain Score 0-No pain   Multiple Pain Sites No      Capillary Blood Glucose: No results found for this or any previous visit (from the past 24 hour(s)).      Exercise Prescription Changes - 05/19/17 1500      Response to Exercise   Blood Pressure (Admit) 100/60  93 O2   Blood Pressure (Exercise) 128/64  93 O2   Blood Pressure (Exit) 120/50  94 O2   Heart Rate (Admit) 77 bpm   Heart Rate (Exercise) 97 bpm   Heart Rate (Exit) 84 bpm   Rating of Perceived Exertion (Exercise) 10   Duration Progress to 30 minutes of  aerobic without signs/symptoms of physical distress   Intensity THRR unchanged     Progression   Progression Continue to progress workloads to maintain intensity without signs/symptoms of physical distress.     Resistance Training   Training Prescription Yes   Weight 1   Reps 10-15     NuStep   Level 1   SPM 12   Minutes 15   METs 3.73     Arm Ergometer   Level 2   Watts 14   RPM 14   Minutes 20   METs 2.1     Home Exercise Plan   Plans to continue exercise at Home (comment)   Frequency Add 2 additional days to program  exercise sessions.      History  Smoking Status  . Never Smoker  Smokeless Tobacco  . Never Used    Goals Met:  Independence with exercise equipment Exercise tolerated well No report of cardiac concerns or symptoms Strength training completed today  Goals Unmet:  Not Applicable  Comments: Check out 1200   Dr. Kate Sable is Medical Director for [facility] and Pulmonary Rehab.

## 2017-05-23 ENCOUNTER — Encounter (HOSPITAL_COMMUNITY)
Admission: RE | Admit: 2017-05-23 | Discharge: 2017-05-23 | Disposition: A | Discharge status: Home / Self Care | Payer: Medicare Other | Source: Ambulatory Visit | Attending: Cardiology | Admitting: Cardiology

## 2017-05-23 DIAGNOSIS — I5022 Chronic systolic (congestive) heart failure: Secondary | ICD-10-CM | POA: Diagnosis not present

## 2017-05-23 NOTE — Progress Notes (Signed)
Daily Session Note  Patient Details  Name: Pamela Soto MRN: [id number] Date of Birth: 1951/02/24 Referring Provider:     CARDIAC REHAB PHASE II ORIENTATION from 04/14/2017 in [place]  Referring Provider  Dr. Harl Bowie      Encounter Date: 05/23/2017  Check In:     Session Check In - 05/23/17 1128      Check-In   Location [facility]   Staff Present Aundra Dubin, RN, BSN;Tali Cleaves Luther Parody, BS, EP, Exercise Physiologist   Supervising physician immediately available to respond to emergencies See telemetry face sheet for immediately available MD   Medication changes reported     No   Fall or balance concerns reported    No   Warm-up and Cool-down Performed as group-led instruction   Resistance Training Performed Yes   VAD Patient? No     Pain Assessment   Currently in Pain? No/denies   Pain Score 0-No pain   Multiple Pain Sites No      Capillary Blood Glucose: No results found for this or any previous visit (from the past 24 hour(s)).    History  Smoking Status  . Never Smoker  Smokeless Tobacco  . Never Used    Goals Met:  Independence with exercise equipment Exercise tolerated well No report of cardiac concerns or symptoms Strength training completed today  Goals Unmet:  Not Applicable  Comments: Check out 1200   Dr. Kate Sable is Medical Director for [facility] and Pulmonary Rehab.

## 2017-05-24 DIAGNOSIS — H2513 Age-related nuclear cataract, bilateral: Secondary | ICD-10-CM | POA: Diagnosis not present

## 2017-05-24 DIAGNOSIS — H524 Presbyopia: Secondary | ICD-10-CM | POA: Diagnosis not present

## 2017-05-25 ENCOUNTER — Encounter (HOSPITAL_COMMUNITY)
Admission: RE | Admit: 2017-05-25 | Discharge: 2017-05-25 | Disposition: A | Discharge status: Home / Self Care | Payer: Medicare Other | Source: Ambulatory Visit | Attending: Cardiology | Admitting: Cardiology

## 2017-05-25 DIAGNOSIS — I5022 Chronic systolic (congestive) heart failure: Secondary | ICD-10-CM

## 2017-05-25 NOTE — Progress Notes (Signed)
Cardiac Individual Treatment Plan  Patient Details  Name: Pamela Soto MRN: [id number] Date of Birth: 16-Mar-1951 Referring Provider:     CARDIAC REHAB PHASE II ORIENTATION from 04/14/2017 in [place]  Referring Provider  Dr. Harl Bowie      Initial Encounter Date:    CARDIAC REHAB PHASE II ORIENTATION from 04/14/2017 in [place]  Date  04/14/17  Referring Provider  Dr. Harl Bowie      Visit Diagnosis: Chronic systolic heart failure ([place])  Patient's Home Medications on Admission:  Current Outpatient Prescriptions:  .  acetaminophen (TYLENOL) 500 MG tablet, Take 1 tablet (500 mg total) by mouth every 6 (six) hours as needed., Disp: 30 tablet, Rfl: 0 .  albuterol (PROVENTIL) (2.5 MG/3ML) 0.083% nebulizer solution, Take 3 mLs (2.5 mg total) by nebulization every 6 (six) hours as needed for wheezing or shortness of breath., Disp: 75 mL, Rfl: 12 .  atorvastatin (LIPITOR) 20 MG tablet, Take 1 tablet (20 mg total) by mouth daily., Disp: 90 tablet, Rfl: 1 .  bisoprolol (ZEBETA) 5 MG tablet, Take 1 tablet (5 mg total) by mouth daily., Disp: 90 tablet, Rfl: 1 .  clopidogrel (PLAVIX) 75 MG tablet, TAKE ONE TABLET BY MOUTH DAILY., Disp: 30 tablet, Rfl: 6 .  Coenzyme Q10 (CO Q-10) 50 MG CAPS, Take 1 capsule by mouth 2 (two) times daily after a meal., Disp: 60 each, Rfl: 0 .  ELIQUIS 5 MG TABS tablet, TAKE ONE TABLET BY MOUTH TWICE DAILY., Disp: 60 tablet, Rfl: 3 .  furosemide (LASIX) 40 MG tablet, TAKE ONE TABLET BY MOUTH DAILY., Disp: 30 tablet, Rfl: 6 .  lisinopril (PRINIVIL,ZESTRIL) 5 MG tablet, Take 1 tablet (5 mg total) by mouth daily., Disp: 90 tablet, Rfl: 3 .  potassium chloride SA (K-DUR,KLOR-CON) 20 MEQ tablet, TAKE ONE TABLET BY MOUTH DAILY., Disp: 30 tablet, Rfl: 6 .  tiotropium (SPIRIVA HANDIHALER) 18 MCG inhalation capsule, Place 1 capsule (18 mcg total) into inhaler and inhale daily., Disp: 30 capsule, Rfl: 12  Past Medical History: Past  Medical History:  Diagnosis Date  . Acid reflux   . Hypertension     Tobacco Use: History  Smoking Status  . Never Smoker  Smokeless Tobacco  . Never Used    Labs: Recent Review Flowsheet Data    Labs for ITP Cardiac and Pulmonary Rehab Latest Ref Rng & Units 01/05/2017 01/08/2017 01/10/2017 01/10/2017 02/17/2017   Cholestrol 0 - 200 mg/dL - 210(H) - - -   LDLCALC 0 - 99 mg/dL - 129(H) - - -   HDL >40 mg/dL - 57 - - -   Trlycerides <150 mg/dL - 118 - - -   Hemoglobin A1c 4.6 - 6.5 % 6.1(H) - - - 6.7(H)   PHART 7.350 - 7.450 - - 7.349(L) - -   PCO2ART 32.0 - 48.0 mmHg - - 68.5(HH) - -   HCO3 20.0 - 28.0 mmol/L - - 37.8(H) 39.3(H) -   TCO2 0 - 100 mmol/L - - 40 42 -   O2SAT % - - 94.0 56.0 -      Capillary Blood Glucose: Lab Results  Component Value Date   GLUCAP 99 11/05/2012     Exercise Target Goals:    Exercise Program Goal: Individual exercise prescription set with THRR, safety & activity barriers. Participant demonstrates ability to understand and report RPE using BORG scale, to self-measure pulse accurately, and to acknowledge the importance of the exercise prescription.  Exercise Prescription Goal: Starting with  aerobic activity 30 plus minutes a day, 3 days per week for initial exercise prescription. Provide home exercise prescription and guidelines that participant acknowledges understanding prior to discharge.  Activity Barriers & Risk Stratification:     Activity Barriers & Cardiac Risk Stratification - 04/14/17 1503      Activity Barriers & Cardiac Risk Stratification   Activity Barriers Shortness of Breath   Cardiac Risk Stratification High      6 Minute Walk:   Oxygen Initial Assessment:   Oxygen Re-Evaluation:   Oxygen Discharge (Final Oxygen Re-Evaluation):   Initial Exercise Prescription:     Initial Exercise Prescription - 04/14/17 1400      Date of Initial Exercise RX and Referring Provider   Date 04/14/17   Referring Provider Dr.  Harl Bowie     NuStep   Level 2   SPM 9   Minutes 15   METs 1.7     Arm Ergometer   Level 1.5   Watts 11   RPM 11   Minutes 20   METs 1.8     Prescription Details   Frequency (times per week) 3   Duration Progress to 30 minutes of continuous aerobic without signs/symptoms of physical distress     Intensity   THRR 40-80% of Max Heartrate 106-123-139   Ratings of Perceived Exertion 11-13   Perceived Dyspnea 0-4     Progression   Progression Continue progressive overload as per policy without signs/symptoms or physical distress.     Resistance Training   Training Prescription Yes   Weight 1   Reps 10-15      Perform Capillary Blood Glucose checks as needed.  Exercise Prescription Changes:      Exercise Prescription Changes    Row Name 04/25/17 1400 05/09/17 1400 05/19/17 1500         Response to Exercise   Blood Pressure (Admit) 130/72 132/60  95 O2 100/60  93 O2     Blood Pressure (Exercise) 154/76 110/58  91 O2 128/64  93 O2     Blood Pressure (Exit) 130/72 120/58  91 O2 120/50  94 O2     Heart Rate (Admit) 79 bpm 78 bpm 77 bpm     Heart Rate (Exercise) 94 bpm 84 bpm 97 bpm     Heart Rate (Exit) 83 bpm 87 bpm 84 bpm     Rating of Perceived Exertion (Exercise) 12 10 10      Duration  - Progress to 30 minutes of  aerobic without signs/symptoms of physical distress Progress to 30 minutes of  aerobic without signs/symptoms of physical distress     Intensity  - THRR unchanged THRR unchanged       Progression   Progression  - Continue to progress workloads to maintain intensity without signs/symptoms of physical distress. Continue to progress workloads to maintain intensity without signs/symptoms of physical distress.       Resistance Training   Training Prescription Yes Yes Yes     Weight 1 1 1      Reps 10-15 10-15 10-15       NuStep   Level 1 1 1      SPM 12 15 12      Minutes 15 15 15      METs 3.74 3.73 3.73       Arm Ergometer   Level 1.7 2 2       Watts 14 10 14      RPM 14 10 14      Minutes 20 20 20  METs 2.1 1.8 2.1       Home Exercise Plan   Plans to continue exercise at Home (comment) Home (comment) Home (comment)     Frequency Add 2 additional days to program exercise sessions. Add 2 additional days to program exercise sessions. Add 2 additional days to program exercise sessions.        Exercise Comments:      Exercise Comments    Row Name 04/25/17 1452 05/09/17 1424 05/19/17 1527       Exercise Comments Patient is doing well in CR and is being closely monitored.  Patient is doing alright in CR. Her O2 is being monitored and her doctor has recently scheduled an appointment for her.  Patient is maintaining her levels but however has not been progressed due to O2 issues when exerting energy.         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       Intervention Provide advice, education, support and counseling about physical activity/exercise needs.;Develop an individualized exercise prescription for aerobic and resistive training based on initial evaluation findings, risk stratification, comorbidities and participant's personal goals.       Expected Outcomes Achievement of increased cardiorespiratory fitness and enhanced flexibility, muscular endurance and strength shown through measurements of functional capacity and personal statement of participant.       Increase Strength and Stamina Yes       Intervention Provide advice, education, support and counseling about physical activity/exercise needs.;Develop an individualized exercise prescription for aerobic and resistive training based on initial evaluation findings, risk stratification, comorbidities and participant's personal goals.       Expected Outcomes Achievement of  increased cardiorespiratory fitness and enhanced flexibility, muscular endurance and strength shown through measurements of functional capacity and personal statement of participant.          Exercise Goals Re-Evaluation :     Exercise Goals Re-Evaluation    Row Name 05/25/17 1320             Exercise Goal Re-Evaluation   Exercise Goals Review Increase Physical Activity;Increase Strenth and Stamina       Comments Patient has completed 15 sessions. She says she feels stronger. She has not been progressed d/t SOB with exerction. Her O2 Saturation remains above 90 during exercise. Will continue to monitor.        Expected Outcomes Patient will complete the program with increased strength, stamina, and activity.           Discharge Exercise Prescription (Final Exercise Prescription Changes):     Exercise Prescription Changes - 05/19/17 1500      Response to Exercise   Blood Pressure (Admit) 100/60  93 O2   Blood Pressure (Exercise) 128/64  93 O2   Blood Pressure (Exit) 120/50  94 O2   Heart Rate (Admit) 77 bpm   Heart Rate (Exercise) 97 bpm   Heart Rate (Exit) 84 bpm   Rating of Perceived Exertion (Exercise) 10   Duration Progress to 30 minutes of  aerobic without signs/symptoms of physical distress   Intensity THRR unchanged     Progression   Progression Continue to progress workloads to maintain intensity without signs/symptoms of physical distress.  Resistance Training   Training Prescription Yes   Weight 1   Reps 10-15     NuStep   Level 1   SPM 12   Minutes 15   METs 3.73     Arm Ergometer   Level 2   Watts 14   RPM 14   Minutes 20   METs 2.1     Home Exercise Plan   Plans to continue exercise at Home (comment)   Frequency Add 2 additional days to program exercise sessions.      Nutrition:  Target Goals: Understanding of nutrition guidelines, daily intake of sodium 1500mg , cholesterol 200mg , calories 30% from fat and 7% or less from saturated  fats, daily to have 5 or more servings of fruits and vegetables.  Biometrics:     Pre Biometrics - 04/14/17 1441      Pre Biometrics   Height 5' 5.5" (1.664 m)   Waist Circumference 47 inches   Hip Circumference 51 inches   Waist to Hip Ratio 0.92 %   BMI (Calculated) 42.4   Triceps Skinfold 22 mm   % Body Fat 49.2 %   Grip Strength 46.33 kg   Flexibility 0 in   Single Leg Stand 2 seconds       Nutrition Therapy Plan and Nutrition Goals:   Nutrition Discharge: Rate Your Plate Scores:     Nutrition Assessments - 04/14/17 1506      MEDFICTS Scores   Pre Score 3      Nutrition Goals Re-Evaluation:   Nutrition Goals Discharge (Final Nutrition Goals Re-Evaluation):   Psychosocial: Target Goals: Acknowledge presence or absence of significant depression and/or stress, maximize coping skills, provide positive support system. Participant is able to verbalize types and ability to use techniques and skills needed for reducing stress and depression.  Initial Review & Psychosocial Screening:     Initial Psych Review & Screening - 04/14/17 1510      Initial Review   Current issues with Current Depression     Family Dynamics   Good Support System? Yes     Barriers   Psychosocial barriers to participate in program The patient should benefit from training in stress management and relaxation.     Screening Interventions   Interventions Encouraged to exercise  Patient feels down at times related to her health. She feels is she could lose some weight and have more energy her psychosocial issues would be resolved.       Quality of Life Scores:     Quality of Life - 04/14/17 1442      Quality of Life Scores   Health/Function Pre 4.8 %   Socioeconomic Pre 12 %   Psych/Spiritual Pre 10.29 %   Family Pre 12 %   GLOBAL Pre 8.57 %      PHQ-9: Recent Review Flowsheet Data    Depression screen [facility] 2/9 04/14/2017 02/17/2017   Decreased Interest 1 0   Down, Depressed,  Hopeless 0 0   PHQ - 2 Score 1 0   Altered sleeping 1 -   Tired, decreased energy 3 -   Change in appetite 1 -   Feeling bad or failure about yourself  0 -   Trouble concentrating 1 -   Moving slowly or fidgety/restless 1 -   Suicidal thoughts 0 -   PHQ-9 Score 8 -   Difficult doing work/chores Very difficult -     Interpretation of Total Score  Total Score Depression Severity:  1-4 =  Minimal depression, 5-9 = Mild depression, 10-14 = Moderate depression, 15-19 = Moderately severe depression, 20-27 = Severe depression   Psychosocial Evaluation and Intervention:     Psychosocial Evaluation - 04/14/17 1514      Psychosocial Evaluation & Interventions   Interventions Stress management education;Relaxation education;Encouraged to exercise with the program and follow exercise prescription   Comments Patient feels down at times related to her health. She feels is she could lose some weight and have more energy her psychosocial issues would be resolved. Her PHQ-9 score was 8 and her QOL score was 8.57 indicative of depression.   Expected Outcomes Patient will have increased energy at discharge with improved PHQ-9 and QOL scores.    Continue Psychosocial Services  Follow up required by staff      Psychosocial Re-Evaluation:     Psychosocial Re-Evaluation    [place] Name 05/25/17 1319             Psychosocial Re-Evaluation   Current issues with Current Depression       Comments Patient says she feels down at times d/t having little energy.        Expected Outcomes Patient will have improved or unchanged QOL and PHQ-9 scores at discharge.        Interventions Stress management education;Encouraged to attend Cardiac Rehabilitation for the exercise;Relaxation education       Continue Psychosocial Services  Follow up required by staff          Psychosocial Discharge (Final Psychosocial Re-Evaluation):     Psychosocial Re-Evaluation - 05/25/17 1319      Psychosocial Re-Evaluation    Current issues with Current Depression   Comments Patient says she feels down at times d/t having little energy.    Expected Outcomes Patient will have improved or unchanged QOL and PHQ-9 scores at discharge.    Interventions Stress management education;Encouraged to attend Cardiac Rehabilitation for the exercise;Relaxation education   Continue Psychosocial Services  Follow up required by staff      Vocational Rehabilitation: Provide vocational rehab assistance to qualifying candidates.   Vocational Rehab Evaluation & Intervention:     Vocational Rehab - 04/14/17 1502      Initial Vocational Rehab Evaluation & Intervention   Assessment shows need for Vocational Rehabilitation No      Education: Education Goals: Education classes will be provided on a weekly basis, covering required topics. Participant will state understanding/return demonstration of topics presented.  Learning Barriers/Preferences:     Learning Barriers/Preferences - 04/14/17 1502      Learning Barriers/Preferences   Learning Barriers None   Learning Preferences Skilled Demonstration;Pictoral;Video      Education Topics: Hypertension, Hypertension Reduction -Define heart disease and high blood pressure. Discus how high blood pressure affects the body and ways to reduce high blood pressure.   Exercise and Your Heart -Discuss why it is important to exercise, the FITT principles of exercise, normal and abnormal responses to exercise, and how to exercise safely.   Angina -Discuss definition of angina, causes of angina, treatment of angina, and how to decrease risk of having angina.   CARDIAC REHAB PHASE II EXERCISE from 05/25/2017 in [place]  Date  04/20/17  Educator  DC  Instruction Review Code  2- meets goals/outcomes      Cardiac Medications -Review what the following cardiac medications are used for, how they affect the body, and side effects that may occur when taking  the medications.  Medications include Aspirin, Beta blockers, calcium  channel blockers, ACE Inhibitors, angiotensin receptor blockers, diuretics, digoxin, and antihyperlipidemics.   CARDIAC REHAB PHASE II EXERCISE from 05/25/2017 in [place]  Date  04/27/17  Educator  Dc  Instruction Review Code  2- meets goals/outcomes      Congestive Heart Failure -Discuss the definition of CHF, how to live with CHF, the signs and symptoms of CHF, and how keep track of weight and sodium intake.   CARDIAC REHAB PHASE II EXERCISE from 05/25/2017 in [place]  Date  05/04/17  Educator  DC  Instruction Review Code  2- meets goals/outcomes      Heart Disease and Intimacy -Discus the effect sexual activity has on the heart, how changes occur during intimacy as we age, and safety during sexual activity.   CARDIAC REHAB PHASE II EXERCISE from 05/25/2017 in [place]  Date  05/11/17  Educator  DJ  Instruction Review Code  2- meets goals/outcomes      Smoking Cessation / COPD -Discuss different methods to quit smoking, the health benefits of quitting smoking, and the definition of COPD.   CARDIAC REHAB PHASE II EXERCISE from 05/25/2017 in [place]  Date  05/18/17  Educator  Russella Dar  Instruction Review Code  2- meets goals/outcomes      Nutrition I: Fats -Discuss the types of cholesterol, what cholesterol does to the heart, and how cholesterol levels can be controlled.   CARDIAC REHAB PHASE II EXERCISE from 05/25/2017 in [place]  Date  05/25/17  Educator  DC  Instruction Review Code  2- meets goals/outcomes      Nutrition II: Labels -Discuss the different components of food labels and how to read food label   Heart Parts and Heart Disease -Discuss the anatomy of the heart, the pathway of blood circulation through the heart, and these are affected by heart  disease.   Stress I: Signs and Symptoms -Discuss the causes of stress, how stress may lead to anxiety and depression, and ways to limit stress.   Stress II: Relaxation -Discuss different types of relaxation techniques to limit stress.   Warning Signs of Stroke / TIA -Discuss definition of a stroke, what the signs and symptoms are of a stroke, and how to identify when someone is having stroke.   Knowledge Questionnaire Score:     Knowledge Questionnaire Score - 04/14/17 1502      Knowledge Questionnaire Score   Pre Score 20/24      Core Components/Risk Factors/Patient Goals at Admission:     Personal Goals and Risk Factors at Admission - 04/14/17 1506      Core Components/Risk Factors/Patient Goals on Admission    Weight Management Obesity;Yes   Intervention Weight Management: Develop a combined nutrition and exercise program designed to reach desired caloric intake, while maintaining appropriate intake of nutrient and fiber, sodium and fats, and appropriate energy expenditure required for the weight goal.   Admit Weight 258 lb (117 kg)   Goal Weight: Short Term 253 lb (114.8 kg)   Goal Weight: Long Term 243 lb (110.2 kg)   Expected Outcomes Short Term: Continue to assess and modify interventions until short term weight is achieved;Long Term: Adherence to nutrition and physical activity/exercise program aimed toward attainment of established weight goal   Improve shortness of breath with ADL's Yes   Intervention Provide education, individualized exercise plan and daily activity instruction to help decrease symptoms of SOB with activities of daily living.  Expected Outcomes Short Term: Achieves a reduction of symptoms when performing activities of daily living.   Develop more efficient breathing techniques such as purse lipped breathing and diaphragmatic breathing; and practicing self-pacing with activity Yes   Intervention Provide education, demonstration and support about  specific breathing techniuqes utilized for more efficient breathing. Include techniques such as pursed lipped breathing, diaphragmatic breathing and self-pacing activity.   Expected Outcomes Short Term: Participant will be able to demonstrate and use breathing techniques as needed throughout daily activities.   Heart Failure Yes   Intervention Provide a combined exercise and nutrition program that is supplemented with education, support and counseling about heart failure. Directed toward relieving symptoms such as shortness of breath, decreased exercise tolerance, and extremity edema.   Expected Outcomes Improve functional capacity of life;Short term: Attendance in program 2-3 days a week with increased exercise capacity. Reported lower sodium intake. Reported increased fruit and vegetable intake. Reports medication compliance.   Personal Goal Other Yes   Personal Goal Lose 50 lbs long term. Eat right   Intervention Patient will attend CR 3 days/week and supplement with exercise at home 2 days/week.   Expected Outcomes Patient will meet her personal goals.      Core Components/Risk Factors/Patient Goals Review:      Goals and Risk Factor Review    Row Name 05/25/17 1316             Core Components/Risk Factors/Patient Goals Review   Personal Goals Review Weight Management/Obesity;Improve shortness of breath with ADL's;Heart Failure  Lose weight; get stronger; eat healthier       Review Patient has completed 15 sessions gaining 2.6 lbs. She is slowly progressing in the program. She says her SOB has improved since she started the program and she feels stronger. Will continue to monitor for progress.        Expected Outcomes Patient will ccomplete the program and begin to work toward her weight loss goal.           Core Components/Risk Factors/Patient Goals at Discharge (Final Review):      Goals and Risk Factor Review - 05/25/17 1316      Core Components/Risk Factors/Patient Goals  Review   Personal Goals Review Weight Management/Obesity;Improve shortness of breath with ADL's;Heart Failure  Lose weight; get stronger; eat healthier   Review Patient has completed 15 sessions gaining 2.6 lbs. She is slowly progressing in the program. She says her SOB has improved since she started the program and she feels stronger. Will continue to monitor for progress.    Expected Outcomes Patient will ccomplete the program and begin to work toward her weight loss goal.       ITP Comments:     ITP Comments    Row Name 04/27/17 1406           ITP Comments Patient new to program completing 5 sessions. She says she already feels better with decreased pain in her knees. Will continue to monitor for progress.           Comments: ITP 30 Day REVIEW Patient doing well in the program. Will continue to monitor for progress.

## 2017-05-25 NOTE — Progress Notes (Signed)
Daily Session Note  Patient Details  Name: Pamela Soto MRN: [id number] Date of Birth: 1951-07-08 Referring Provider:     CARDIAC REHAB PHASE II ORIENTATION from 04/14/2017 in [place]  Referring Provider  Dr. Harl Bowie      Encounter Date: 05/25/2017  Check In:     Session Check In - 05/25/17 1108      Check-In   Location [facility]   Staff Present Diane Angelina Pih, MS, EP, [facility], Exercise Physiologist;Debra Wynetta Emery, RN, BSN;Kamaryn Grimley, BS, EP, Exercise Physiologist   Supervising physician immediately available to respond to emergencies See telemetry face sheet for immediately available MD   Medication changes reported     No   Fall or balance concerns reported    No   Warm-up and Cool-down Performed as group-led instruction   Resistance Training Performed Yes   VAD Patient? No     Pain Assessment   Currently in Pain? No/denies   Pain Score 0-No pain   Multiple Pain Sites No      Capillary Blood Glucose: No results found for this or any previous visit (from the past 24 hour(s)).    History  Smoking Status  . Never Smoker  Smokeless Tobacco  . Never Used    Goals Met:  Independence with exercise equipment Exercise tolerated well No report of cardiac concerns or symptoms Strength training completed today  Goals Unmet:  Not Applicable  Comments: Check out 1200   Dr. Kate Sable is Medical Director for [facility] and Pulmonary Rehab.

## 2017-05-27 ENCOUNTER — Encounter (HOSPITAL_COMMUNITY)
Admission: RE | Admit: 2017-05-27 | Discharge: 2017-05-27 | Disposition: A | Discharge status: Home / Self Care | Payer: Medicare Other | Source: Ambulatory Visit | Attending: Cardiology | Admitting: Cardiology

## 2017-05-27 DIAGNOSIS — I5022 Chronic systolic (congestive) heart failure: Secondary | ICD-10-CM

## 2017-05-27 NOTE — Progress Notes (Signed)
Daily Session Note  Patient Details  Name: Pamela Soto MRN: [id number] Date of Birth: 1951-06-30 Referring Provider:     CARDIAC REHAB PHASE II ORIENTATION from 04/14/2017 in [facility]  Referring Provider  Dr. Harl Bowie      Encounter Date: 05/27/2017  Check In:     Session Check In - 05/27/17 1109      Check-In   Location [facility]   Staff Present Aundra Dubin, RN, BSN;Iyonnah Ferrante Luther Parody, BS, EP, Exercise Physiologist   Supervising physician immediately available to respond to emergencies See telemetry face sheet for immediately available MD   Medication changes reported     No   Fall or balance concerns reported    No   Warm-up and Cool-down Performed as group-led instruction   Resistance Training Performed Yes   VAD Patient? No     Pain Assessment   Currently in Pain? No/denies   Pain Score 0-No pain   Multiple Pain Sites No      Capillary Blood Glucose: No results found for this or any previous visit (from the past 24 hour(s)).    History  Smoking Status  . Never Smoker  Smokeless Tobacco  . Never Used    Goals Met:  Independence with exercise equipment Exercise tolerated well No report of cardiac concerns or symptoms Strength training completed today  Goals Unmet:  Not Applicable  Comments: Check out 1200   Dr. Kate Sable is Medical Director for [place] and Pulmonary Re[facility].

## 2017-05-30 ENCOUNTER — Encounter (HOSPITAL_COMMUNITY)
Admission: RE | Admit: 2017-05-30 | Discharge: 2017-05-30 | Disposition: A | Discharge status: Home / Self Care | Payer: Medicare Other | Source: Ambulatory Visit | Attending: Cardiology | Admitting: Cardiology

## 2017-05-30 DIAGNOSIS — I5022 Chronic systolic (congestive) heart failure: Secondary | ICD-10-CM | POA: Diagnosis not present

## 2017-05-30 NOTE — Progress Notes (Signed)
Daily Session Note  Patient Details  Name: Pamela Soto MRN: [id number] Date of Birth: 08/13/1951 Referring Provider:     CARDIAC REHAB PHASE II ORIENTATION from 04/14/2017 in [facility]  Referring Provider  Dr. Branch      Encounter Date: 05/30/2017  Check In:     Session Check In - 05/30/17 1120      Check-In   Location [facility]   Staff Present Hassen Bruun, BS, EP, Exercise Physiologist;Debra Johnson, RN, BSN   Supervising physician immediately available to respond to emergencies See telemetry face sheet for immediately available MD   Medication changes reported     No   Fall or balance concerns reported    No   Warm-up and Cool-down Performed as group-led instruction   Resistance Training Performed Yes   VAD Patient? No     Pain Assessment   Currently in Pain? No/denies   Pain Score 0-No pain   Multiple Pain Sites No      Capillary Blood Glucose: No results found for this or any previous visit (from the past 24 hour(s)).    History  Smoking Status  . Never Smoker  Smokeless Tobacco  . Never Used    Goals Met:  Independence with exercise equipment Exercise tolerated well No report of cardiac concerns or symptoms Strength training completed today  Goals Unmet:  Not Applicable  Comments: Check out 1200   Dr. Suresh Koneswaran is Medical Director for [facility]. 

## 2017-06-01 ENCOUNTER — Encounter (HOSPITAL_COMMUNITY)
Admission: RE | Admit: 2017-06-01 | Discharge: 2017-06-01 | Disposition: A | Discharge status: Home / Self Care | Payer: Medicare Other | Source: Ambulatory Visit | Attending: Cardiology | Admitting: Cardiology

## 2017-06-01 DIAGNOSIS — I5022 Chronic systolic (congestive) heart failure: Secondary | ICD-10-CM

## 2017-06-01 NOTE — Progress Notes (Signed)
Daily Session Note  Patient Details  Name: Pamela Soto MRN: [id number] Date of Birth: [birth year] Referring Provider:     CARDIAC REHAB PHASE II ORIENTATION from 04/14/2017 in [facility]  Referring Provider  Dr. Harl Bowie      Encounter Date: 06/01/2017  Check In:     Session Check In - 06/01/17 1135      Check-In   Location [facility]   Staff Present Aundra Dubin, RN, BSN;Bryahna Lesko Luther Parody, BS, EP, Exercise Physiologist   Supervising physician immediately available to respond to emergencies See telemetry face sheet for immediately available MD   Medication changes reported     No   Fall or balance concerns reported    No   Warm-up and Cool-down Performed as group-led instruction   Resistance Training Performed Yes   VAD Patient? No     Pain Assessment   Currently in Pain? No/denies   Pain Score 0-No pain   Multiple Pain Sites No      Capillary Blood Glucose: No results found for this or any previous visit (from the past 24 hour(s)).    History  Smoking Status  . Never Smoker  Smokeless Tobacco  . Never Used    Goals Met:  Independence with exercise equipment Exercise tolerated well No report of cardiac concerns or symptoms Strength training completed today  Goals Unmet:  Not Applicable  Comments: Check out 1200   Dr. Kate Sable is Medical Director for [place] and [facility] Rehab.

## 2017-06-03 ENCOUNTER — Encounter (HOSPITAL_COMMUNITY)
Admission: RE | Admit: 2017-06-03 | Discharge: 2017-06-03 | Disposition: A | Discharge status: Home / Self Care | Payer: Medicare Other | Source: Ambulatory Visit | Attending: Cardiology | Admitting: Cardiology

## 2017-06-03 DIAGNOSIS — I5022 Chronic systolic (congestive) heart failure: Secondary | ICD-10-CM

## 2017-06-03 NOTE — Progress Notes (Signed)
Daily Session Note  Patient Details  Name: Pamela Soto MRN: [id number] Date of Birth: October 26, 1951 Referring Provider:     CARDIAC REHAB PHASE II ORIENTATION from 04/14/2017 in [facility]  Referring Provider  Dr. Harl Bowie      Encounter Date: 06/03/2017  Check In:     Session Check In - 06/03/17 1100      Check-In   Location [facility]   Staff Present Aundra Dubin, RN, BSN;Gregory Luther Parody, BS, EP, Exercise Physiologist   Supervising physician immediately available to respond to emergencies See telemetry face sheet for immediately available MD   Medication changes reported     No   Fall or balance concerns reported    No   Tobacco Cessation No Change   Warm-up and Cool-down Performed as group-led instruction   Resistance Training Performed Yes   VAD Patient? No     Pain Assessment   Currently in Pain? No/denies   Pain Score 0-No pain   Multiple Pain Sites No      Capillary Blood Glucose: No results found for this or any previous visit (from the past 24 hour(s)).    History  Smoking Status  . Never Smoker  Smokeless Tobacco  . Never Used    Goals Met:  Independence with exercise equipment Exercise tolerated well No report of cardiac concerns or symptoms Strength training completed today  Goals Unmet:  Not Applicable  Comments: Check out 1200.   Dr. Kate Sable is Medical Director for [facility].

## 2017-06-06 ENCOUNTER — Encounter (HOSPITAL_COMMUNITY)
Admission: RE | Admit: 2017-06-06 | Discharge: 2017-06-06 | Disposition: A | Discharge status: Home / Self Care | Payer: Medicare Other | Source: Ambulatory Visit | Attending: Cardiology | Admitting: Cardiology

## 2017-06-06 DIAGNOSIS — I5022 Chronic systolic (congestive) heart failure: Secondary | ICD-10-CM

## 2017-06-06 NOTE — Progress Notes (Signed)
Daily Session Note  Patient Details  Name: KARISMA MEISER MRN: [id number] Date of Birth: Jan 24, 1951 Referring Provider:     CARDIAC REHAB PHASE II ORIENTATION from 04/14/2017 in [facility]  Referring Provider  Dr. Harl Bowie      Encounter Date: 06/06/2017  Check In:     Session Check In - 06/06/17 1100      Check-In   Location [facility]   Staff Present Maher Shon Angelina Pih, MS, EP, [facility], Exercise Physiologist;Gregory Luther Parody, BS, EP, Exercise Physiologist   Supervising physician immediately available to respond to emergencies See telemetry face sheet for immediately available MD   Medication changes reported     No   Fall or balance concerns reported    No   Tobacco Cessation No Change   Warm-up and Cool-down Performed as group-led instruction   Resistance Training Performed Yes   VAD Patient? No     Pain Assessment   Currently in Pain? No/denies   Pain Score 0-No pain   Multiple Pain Sites No      Capillary Blood Glucose: No results found for this or any previous visit (from the past 24 hour(s)).    History  Smoking Status  . Never Smoker  Smokeless Tobacco  . Never Used    Goals Met:  Independence with exercise equipment Exercise tolerated well No report of cardiac concerns or symptoms Strength training completed today  Goals Unmet:  Not Applicable  Comments: Check out 12:00    Dr. Kate Sable is Medical Director for [place] and Pulmonary Re[facility].

## 2017-06-08 ENCOUNTER — Encounter (HOSPITAL_COMMUNITY): Payer: Medicare Other

## 2017-06-10 ENCOUNTER — Encounter (HOSPITAL_COMMUNITY): Payer: Medicare Other

## 2017-06-13 ENCOUNTER — Encounter (HOSPITAL_COMMUNITY)
Admission: RE | Admit: 2017-06-13 | Discharge: 2017-06-13 | Disposition: A | Discharge status: Home / Self Care | Payer: Medicare Other | Source: Ambulatory Visit | Attending: Cardiology | Admitting: Cardiology

## 2017-06-13 DIAGNOSIS — I5022 Chronic systolic (congestive) heart failure: Secondary | ICD-10-CM

## 2017-06-13 NOTE — Progress Notes (Signed)
Daily Session Note  Patient Details  Name: Pamela Soto MRN: [id number] Date of Birth: 06/25/1951 Referring Provider:     CARDIAC REHAB PHASE II ORIENTATION from 04/14/2017 in [facility]  Referring Provider  Dr. Harl Bowie      Encounter Date: 06/13/2017  Check In:     Session Check In - 06/13/17 1114      Check-In   Location [facility]   Staff Present Aundra Dubin, RN, BSN;Bowen Kia Luther Parody, BS, EP, Exercise Physiologist   Supervising physician immediately available to respond to emergencies See telemetry face sheet for immediately available MD   Medication changes reported     No   Fall or balance concerns reported    No   Warm-up and Cool-down Performed as group-led instruction   Resistance Training Performed Yes   VAD Patient? No     Pain Assessment   Currently in Pain? No/denies   Pain Score 0-No pain   Multiple Pain Sites No      Capillary Blood Glucose: No results found for this or any previous visit (from the past 24 hour(s)).    History  Smoking Status  . Never Smoker  Smokeless Tobacco  . Never Used    Goals Met:  Independence with exercise equipment Exercise tolerated well No report of cardiac concerns or symptoms Strength training completed today  Goals Unmet:  Not Applicable  Comments: Check out 1200   Dr. Kate Sable is Medical Director for [place] and Pulmonary Rehab.

## 2017-06-13 NOTE — Progress Notes (Signed)
Cardiac Individual Treatment Plan  Patient Details  Name: Pamela Soto MRN: [id number] Date of Birth: 1951-07-22 Referring Provider:     CARDIAC REHAB PHASE II ORIENTATION from 04/14/2017 in [place]  Referring Provider  Dr. Harl Bowie      Initial Encounter Date:    CARDIAC REHAB PHASE II ORIENTATION from 04/14/2017 in [place]  Date  04/14/17  Referring Provider  Dr. Harl Bowie      Visit Diagnosis: Chronic systolic heart failure ([place])  Patient's Home Medications on Admission:  Current Outpatient Prescriptions:  .  acetaminophen (TYLENOL) 500 MG tablet, Take 1 tablet (500 mg total) by mouth every 6 (six) hours as needed., Disp: 30 tablet, Rfl: 0 .  albuterol (PROVENTIL) (2.5 MG/3ML) 0.083% nebulizer solution, Take 3 mLs (2.5 mg total) by nebulization every 6 (six) hours as needed for wheezing or shortness of breath., Disp: 75 mL, Rfl: 12 .  atorvastatin (LIPITOR) 20 MG tablet, Take 1 tablet (20 mg total) by mouth daily., Disp: 90 tablet, Rfl: 1 .  bisoprolol (ZEBETA) 5 MG tablet, Take 1 tablet (5 mg total) by mouth daily., Disp: 90 tablet, Rfl: 1 .  clopidogrel (PLAVIX) 75 MG tablet, TAKE ONE TABLET BY MOUTH DAILY., Disp: 30 tablet, Rfl: 6 .  Coenzyme Q10 (CO Q-10) 50 MG CAPS, Take 1 capsule by mouth 2 (two) times daily after a meal., Disp: 60 each, Rfl: 0 .  ELIQUIS 5 MG TABS tablet, TAKE ONE TABLET BY MOUTH TWICE DAILY., Disp: 60 tablet, Rfl: 3 .  furosemide (LASIX) 40 MG tablet, TAKE ONE TABLET BY MOUTH DAILY., Disp: 30 tablet, Rfl: 6 .  lisinopril (PRINIVIL,ZESTRIL) 5 MG tablet, Take 1 tablet (5 mg total) by mouth daily., Disp: 90 tablet, Rfl: 3 .  potassium chloride SA (K-DUR,KLOR-CON) 20 MEQ tablet, TAKE ONE TABLET BY MOUTH DAILY., Disp: 30 tablet, Rfl: 6 .  tiotropium (SPIRIVA HANDIHALER) 18 MCG inhalation capsule, Place 1 capsule (18 mcg total) into inhaler and inhale daily., Disp: 30 capsule, Rfl: 12  Past Medical History: Past  Medical History:  Diagnosis Date  . Acid reflux   . Hypertension     Tobacco Use: History  Smoking Status  . Never Smoker  Smokeless Tobacco  . Never Used    Labs: Recent Review Flowsheet Data    Labs for ITP Cardiac and Pulmonary Rehab Latest Ref Rng & Units 01/05/2017 01/08/2017 01/10/2017 01/10/2017 02/17/2017   Cholestrol 0 - 200 mg/dL - 210(H) - - -   LDLCALC 0 - 99 mg/dL - 129(H) - - -   HDL >40 mg/dL - 57 - - -   Trlycerides <150 mg/dL - 118 - - -   Hemoglobin A1c 4.6 - 6.5 % 6.1(H) - - - 6.7(H)   PHART 7.350 - 7.450 - - 7.349(L) - -   PCO2ART 32.0 - 48.0 mmHg - - 68.5(HH) - -   HCO3 20.0 - 28.0 mmol/L - - 37.8(H) 39.3(H) -   TCO2 0 - 100 mmol/L - - 40 42 -   O2SAT % - - 94.0 56.0 -      Capillary Blood Glucose: Lab Results  Component Value Date   GLUCAP 99 11/05/2012     Exercise Target Goals:    Exercise Program Goal: Individual exercise prescription set with THRR, safety & activity barriers. Participant demonstrates ability to understand and report RPE using BORG scale, to self-measure pulse accurately, and to acknowledge the importance of the exercise prescription.  Exercise Prescription Goal: Starting with  aerobic activity 30 plus minutes a day, 3 days per week for initial exercise prescription. Provide home exercise prescription and guidelines that participant acknowledges understanding prior to discharge.  Activity Barriers & Risk Stratification:     Activity Barriers & Cardiac Risk Stratification - 04/14/17 1503      Activity Barriers & Cardiac Risk Stratification   Activity Barriers Shortness of Breath   Cardiac Risk Stratification High      6 Minute Walk:   Oxygen Initial Assessment:   Oxygen Re-Evaluation:   Oxygen Discharge (Final Oxygen Re-Evaluation):   Initial Exercise Prescription:     Initial Exercise Prescription - 04/14/17 1400      Date of Initial Exercise RX and Referring Provider   Date 04/14/17   Referring Provider Dr.  Harl Bowie     NuStep   Level 2   SPM 9   Minutes 15   METs 1.7     Arm Ergometer   Level 1.5   Watts 11   RPM 11   Minutes 20   METs 1.8     Prescription Details   Frequency (times per week) 3   Duration Progress to 30 minutes of continuous aerobic without signs/symptoms of physical distress     Intensity   THRR 40-80% of Max Heartrate 106-123-139   Ratings of Perceived Exertion 11-13   Perceived Dyspnea 0-4     Progression   Progression Continue progressive overload as per policy without signs/symptoms or physical distress.     Resistance Training   Training Prescription Yes   Weight 1   Reps 10-15      Perform Capillary Blood Glucose checks as needed.  Exercise Prescription Changes:      Exercise Prescription Changes    Row Name 04/25/17 1400 05/09/17 1400 05/19/17 1500 06/06/17 1400       Response to Exercise   Blood Pressure (Admit) 130/72 132/60  95 O2 100/60  93 O2 128/66  90 O2    Blood Pressure (Exercise) 154/76 110/58  91 O2 128/64  93 O2 122/72  95 O2    Blood Pressure (Exit) 130/72 120/58  91 O2 120/50  94 O2 122/70  90 O2    Heart Rate (Admit) 79 bpm 78 bpm 77 bpm 72 bpm    Heart Rate (Exercise) 94 bpm 84 bpm 97 bpm 88 bpm    Heart Rate (Exit) 83 bpm 87 bpm 84 bpm 77 bpm    Rating of Perceived Exertion (Exercise) 12 10 10 10     Duration  - Progress to 30 minutes of  aerobic without signs/symptoms of physical distress Progress to 30 minutes of  aerobic without signs/symptoms of physical distress Progress to 30 minutes of  aerobic without signs/symptoms of physical distress    Intensity  - THRR unchanged THRR unchanged THRR unchanged      Progression   Progression  - Continue to progress workloads to maintain intensity without signs/symptoms of physical distress. Continue to progress workloads to maintain intensity without signs/symptoms of physical distress. Continue to progress workloads to maintain intensity without signs/symptoms of physical  distress.      Resistance Training   Training Prescription Yes Yes Yes Yes    Weight 1 1 1 1     Reps 10-15 10-15 10-15 10-15      NuStep   Level 1 1 1 2     SPM 12 15 12 20     Minutes 15 15 15 15     METs 3.74 3.73 3.73 3.74  Arm Ergometer   Level 1.7 2 2  2.3    Watts 14 10 14 5     RPM 14 10 14 14     Minutes 20 20 20 20     METs 2.1 1.8 2.1 2.3      Home Exercise Plan   Plans to continue exercise at Home (comment) Home (comment) Home (comment) Home (comment)    Frequency Add 2 additional days to program exercise sessions. Add 2 additional days to program exercise sessions. Add 2 additional days to program exercise sessions. Add 2 additional days to program exercise sessions.       Exercise Comments:      Exercise Comments    Row Name 04/25/17 1452 05/09/17 1424 05/19/17 1527 06/06/17 1500     Exercise Comments Patient is doing well in CR and is being closely monitored.  Patient is doing alright in CR. Her O2 is being monitored and her doctor has recently scheduled an appointment for her.  Patient is maintaining her levels but however has not been progressed due to O2 issues when exerting energy.  Patient is doing very well in CR.        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       Intervention Provide advice, education, support and counseling about physical activity/exercise needs.;Develop an individualized exercise prescription for aerobic and resistive training based on initial evaluation findings, risk stratification, comorbidities and participant's personal goals.       Expected Outcomes Achievement of increased cardiorespiratory fitness and enhanced flexibility, muscular endurance and strength shown through measurements of functional capacity and personal statement of  participant.       Increase Strength and Stamina Yes       Intervention Provide advice, education, support and counseling about physical activity/exercise needs.;Develop an individualized exercise prescription for aerobic and resistive training based on initial evaluation findings, risk stratification, comorbidities and participant's personal goals.       Expected Outcomes Achievement of increased cardiorespiratory fitness and enhanced flexibility, muscular endurance and strength shown through measurements of functional capacity and personal statement of participant.          Exercise Goals Re-Evaluation :     Exercise Goals Re-Evaluation    Row Name 05/25/17 1320 06/13/17 1312           Exercise Goal Re-Evaluation   Exercise Goals Review Increase Physical Activity;Increase Strenth and Stamina Increase Physical Activity;Increase Strenth and Stamina      Comments Patient has completed 15 sessions. She says she feels stronger. She has not been progressed d/t SOB with exerction. Her O2 Saturation remains above 90 during exercise. Will continue to monitor.  Patient has completed 21 sessions. She is progressing well in the program with increased strength and stamina. She is now able to stand up during the warm up. Will continue to monitor.       Expected Outcomes Patient will complete the program with increased strength, stamina, and activity. Patient will complete the program with continued increased strength, stamina, and activity.           Discharge Exercise Prescription (Final Exercise Prescription Changes):     Exercise Prescription Changes - 06/06/17 1400  Response to Exercise   Blood Pressure (Admit) 128/66  90 O2   Blood Pressure (Exercise) 122/72  95 O2   Blood Pressure (Exit) 122/70  90 O2   Heart Rate (Admit) 72 bpm   Heart Rate (Exercise) 88 bpm   Heart Rate (Exit) 77 bpm   Rating of Perceived Exertion (Exercise) 10   Duration Progress to 30 minutes of  aerobic  without signs/symptoms of physical distress   Intensity THRR unchanged     Progression   Progression Continue to progress workloads to maintain intensity without signs/symptoms of physical distress.     Resistance Training   Training Prescription Yes   Weight 1   Reps 10-15     NuStep   Level 2   SPM 20   Minutes 15   METs 3.74     Arm Ergometer   Level 2.3   Watts 5   RPM 14   Minutes 20   METs 2.3     Home Exercise Plan   Plans to continue exercise at Home (comment)   Frequency Add 2 additional days to program exercise sessions.      Nutrition:  Target Goals: Understanding of nutrition guidelines, daily intake of sodium 1500mg , cholesterol 200mg , calories 30% from fat and 7% or less from saturated fats, daily to have 5 or more servings of fruits and vegetables.  Biometrics:     Pre Biometrics - 04/14/17 1441      Pre Biometrics   Height 5' 5.5" (1.664 m)   Waist Circumference 47 inches   Hip Circumference 51 inches   Waist to Hip Ratio 0.92 %   BMI (Calculated) 42.4   Triceps Skinfold 22 mm   % Body Fat 49.2 %   Grip Strength 46.33 kg   Flexibility 0 in   Single Leg Stand 2 seconds       Nutrition Therapy Plan and Nutrition Goals:   Nutrition Discharge: Rate Your Plate Scores:     Nutrition Assessments - 04/14/17 1506      MEDFICTS Scores   Pre Score 3      Nutrition Goals Re-Evaluation:   Nutrition Goals Discharge (Final Nutrition Goals Re-Evaluation):   Psychosocial: Target Goals: Acknowledge presence or absence of significant depression and/or stress, maximize coping skills, provide positive support system. Participant is able to verbalize types and ability to use techniques and skills needed for reducing stress and depression.  Initial Review & Psychosocial Screening:     Initial Psych Review & Screening - 04/14/17 1510      Initial Review   Current issues with Current Depression     Family Dynamics   Good Support System? Yes      Barriers   Psychosocial barriers to participate in program The patient should benefit from training in stress management and relaxation.     Screening Interventions   Interventions Encouraged to exercise  Patient feels down at times related to her health. She feels is she could lose some weight and have more energy her psychosocial issues would be resolved.       Quality of Life Scores:     Quality of Life - 04/14/17 1442      Quality of Life Scores   Health/Function Pre 4.8 %   Socioeconomic Pre 12 %   Psych/Spiritual Pre 10.29 %   Family Pre 12 %   GLOBAL Pre 8.57 %      PHQ-9: Recent Review Flowsheet Data    Depression screen [facility] 2/9 04/14/2017  02/17/2017   Decreased Interest 1 0   Down, Depressed, Hopeless 0 0   PHQ - 2 Score 1 0   Altered sleeping 1 -   Tired, decreased energy 3 -   Change in appetite 1 -   Feeling bad or failure about yourself  0 -   Trouble concentrating 1 -   Moving slowly or fidgety/restless 1 -   Suicidal thoughts 0 -   PHQ-9 Score 8 -   Difficult doing work/chores Very difficult -     Interpretation of Total Score  Total Score Depression Severity:  1-4 = Minimal depression, 5-9 = Mild depression, 10-14 = Moderate depression, 15-19 = Moderately severe depression, 20-27 = Severe depression   Psychosocial Evaluation and Intervention:     Psychosocial Evaluation - 04/14/17 1514      Psychosocial Evaluation & Interventions   Interventions Stress management education;Relaxation education;Encouraged to exercise with the program and follow exercise prescription   Comments Patient feels down at times related to her health. She feels is she could lose some weight and have more energy her psychosocial issues would be resolved. Her PHQ-9 score was 8 and her QOL score was 8.57 indicative of depression.   Expected Outcomes Patient will have increased energy at discharge with improved PHQ-9 and QOL scores.    Continue Psychosocial Services  Follow  up required by staff      Psychosocial Re-Evaluation:     Psychosocial Re-Evaluation    [place] Name 05/25/17 1319 06/13/17 1314           Psychosocial Re-Evaluation   Current issues with Current Depression Current Depression      Comments Patient says she feels down at times d/t having little energy.  Patient says she is feeling better emotionally since her SOB has improved.       Expected Outcomes Patient will have improved or unchanged QOL and PHQ-9 scores at discharge.  Patient will have improved PHQ-9 and QOL scores at discharge.       Interventions Stress management education;Encouraged to attend Cardiac Rehabilitation for the exercise;Relaxation education Encouraged to attend Cardiac Rehabilitation for the exercise;Stress management education;Relaxation education      Continue Psychosocial Services  Follow up required by staff Follow up required by staff         Psychosocial Discharge (Final Psychosocial Re-Evaluation):     Psychosocial Re-Evaluation - 06/13/17 1314      Psychosocial Re-Evaluation   Current issues with Current Depression   Comments Patient says she is feeling better emotionally since her SOB has improved.    Expected Outcomes Patient will have improved PHQ-9 and QOL scores at discharge.    Interventions Encouraged to attend Cardiac Rehabilitation for the exercise;Stress management education;Relaxation education   Continue Psychosocial Services  Follow up required by staff      Vocational Rehabilitation: Provide vocational rehab assistance to qualifying candidates.   Vocational Rehab Evaluation & Intervention:     Vocational Rehab - 04/14/17 1502      Initial Vocational Rehab Evaluation & Intervention   Assessment shows need for Vocational Rehabilitation No      Education: Education Goals: Education classes will be provided on a weekly basis, covering required topics. Participant will state understanding/return demonstration of topics  presented.  Learning Barriers/Preferences:     Learning Barriers/Preferences - 04/14/17 1502      Learning Barriers/Preferences   Learning Barriers None   Learning Preferences Skilled Demonstration;Pictoral;Video      Education Topics: Hypertension, Hypertension Reduction -Define  heart disease and high blood pressure. Discus how high blood pressure affects the body and ways to reduce high blood pressure.   Exercise and Your Heart -Discuss why it is important to exercise, the FITT principles of exercise, normal and abnormal responses to exercise, and how to exercise safely.   Angina -Discuss definition of angina, causes of angina, treatment of angina, and how to decrease risk of having angina.   CARDIAC REHAB PHASE II EXERCISE from 06/01/2017 in [place]  Date  04/20/17  Educator  DC  Instruction Review Code  2- meets goals/outcomes      Cardiac Medications -Review what the following cardiac medications are used for, how they affect the body, and side effects that may occur when taking the medications.  Medications include Aspirin, Beta blockers, calcium channel blockers, ACE Inhibitors, angiotensin receptor blockers, diuretics, digoxin, and antihyperlipidemics.   CARDIAC REHAB PHASE II EXERCISE from 06/01/2017 in [place]  Date  04/27/17  Educator  Dc  Instruction Review Code  2- meets goals/outcomes      Congestive Heart Failure -Discuss the definition of CHF, how to live with CHF, the signs and symptoms of CHF, and how keep track of weight and sodium intake.   CARDIAC REHAB PHASE II EXERCISE from 06/01/2017 in [place]  Date  05/04/17  Educator  DC  Instruction Review Code  2- meets goals/outcomes      Heart Disease and Intimacy -Discus the effect sexual activity has on the heart, how changes occur during intimacy as we age, and safety during sexual activity.   CARDIAC REHAB PHASE II EXERCISE  from 06/01/2017 in [place]  Date  05/11/17  Educator  DJ  Instruction Review Code  2- meets goals/outcomes      Smoking Cessation / COPD -Discuss different methods to quit smoking, the health benefits of quitting smoking, and the definition of COPD.   CARDIAC REHAB PHASE II EXERCISE from 06/01/2017 in [place]  Date  05/18/17  Educator  Russella Dar  Instruction Review Code  2- meets goals/outcomes      Nutrition I: Fats -Discuss the types of cholesterol, what cholesterol does to the heart, and how cholesterol levels can be controlled.   CARDIAC REHAB PHASE II EXERCISE from 06/01/2017 in [place]  Date  05/25/17  Educator  DC  Instruction Review Code  2- meets goals/outcomes      Nutrition II: Labels -Discuss the different components of food labels and how to read food label   CARDIAC REHAB PHASE II EXERCISE from 06/01/2017 in [place]  Date  06/01/17  Educator  DC  Instruction Review Code  2- meets goals/outcomes      Heart Parts and Heart Disease -Discuss the anatomy of the heart, the pathway of blood circulation through the heart, and these are affected by heart disease.   Stress I: Signs and Symptoms -Discuss the causes of stress, how stress may lead to anxiety and depression, and ways to limit stress.   Stress II: Relaxation -Discuss different types of relaxation techniques to limit stress.   Warning Signs of Stroke / TIA -Discuss definition of a stroke, what the signs and symptoms are of a stroke, and how to identify when someone is having stroke.   Knowledge Questionnaire Score:     Knowledge Questionnaire Score - 04/14/17 1502      Knowledge Questionnaire Score   Pre Score 20/24  Core Components/Risk Factors/Patient Goals at Admission:     Personal Goals and Risk Factors at Admission - 04/14/17 1506      Core Components/Risk Factors/Patient Goals  on Admission    Weight Management Obesity;Yes   Intervention Weight Management: Develop a combined nutrition and exercise program designed to reach desired caloric intake, while maintaining appropriate intake of nutrient and fiber, sodium and fats, and appropriate energy expenditure required for the weight goal.   Admit Weight 258 lb (117 kg)   Goal Weight: Short Term 253 lb (114.8 kg)   Goal Weight: Long Term 243 lb (110.2 kg)   Expected Outcomes Short Term: Continue to assess and modify interventions until short term weight is achieved;Long Term: Adherence to nutrition and physical activity/exercise program aimed toward attainment of established weight goal   Improve shortness of breath with ADL's Yes   Intervention Provide education, individualized exercise plan and daily activity instruction to help decrease symptoms of SOB with activities of daily living.   Expected Outcomes Short Term: Achieves a reduction of symptoms when performing activities of daily living.   Develop more efficient breathing techniques such as purse lipped breathing and diaphragmatic breathing; and practicing self-pacing with activity Yes   Intervention Provide education, demonstration and support about specific breathing techniuqes utilized for more efficient breathing. Include techniques such as pursed lipped breathing, diaphragmatic breathing and self-pacing activity.   Expected Outcomes Short Term: Participant will be able to demonstrate and use breathing techniques as needed throughout daily activities.   Heart Failure Yes   Intervention Provide a combined exercise and nutrition program that is supplemented with education, support and counseling about heart failure. Directed toward relieving symptoms such as shortness of breath, decreased exercise tolerance, and extremity edema.   Expected Outcomes Improve functional capacity of life;Short term: Attendance in program 2-3 days a week with increased exercise capacity.  Reported lower sodium intake. Reported increased fruit and vegetable intake. Reports medication compliance.   Personal Goal Other Yes   Personal Goal Lose 50 lbs long term. Eat right   Intervention Patient will attend CR 3 days/week and supplement with exercise at home 2 days/week.   Expected Outcomes Patient will meet her personal goals.      Core Components/Risk Factors/Patient Goals Review:      Goals and Risk Factor Review    Row Name 05/25/17 1316 06/13/17 1310           Core Components/Risk Factors/Patient Goals Review   Personal Goals Review Weight Management/Obesity;Improve shortness of breath with ADL's;Heart Failure  Lose weight; get stronger; eat healthier Weight Management/Obesity;Improve shortness of breath with ADL's;Heart Failure  Get stronger; lose 50 lbs; eat healthy      Review Patient has completed 15 sessions gaining 2.6 lbs. She is slowly progressing in the program. She says her SOB has improved since she started the program and she feels stronger. Will continue to monitor for progress.  Patient has completed 21 sessions gaining 3 lbs. She is doing well in the program with improved SOB. She says she is able to walk in from the parking lot now with less SOB. She says she feels better overall and is trying to work on her diet. Will continue to monitor.       Expected Outcomes Patient will ccomplete the program and begin to work toward her weight loss goal.  Patient will complete the program meeting her personal goals.          Core Components/Risk Factors/Patient Goals at Discharge (Final  Review):      Goals and Risk Factor Review - 06/13/17 1310      Core Components/Risk Factors/Patient Goals Review   Personal Goals Review Weight Management/Obesity;Improve shortness of breath with ADL's;Heart Failure  Get stronger; lose 50 lbs; eat healthy   Review Patient has completed 21 sessions gaining 3 lbs. She is doing well in the program with improved SOB. She says she is  able to walk in from the parking lot now with less SOB. She says she feels better overall and is trying to work on her diet. Will continue to monitor.    Expected Outcomes Patient will complete the program meeting her personal goals.       ITP Comments:     ITP Comments    Row Name 04/27/17 1406           ITP Comments Patient new to program completing 5 sessions. She says she already feels better with decreased pain in her knees. Will continue to monitor for progress.           Comments: ITP 30 Day REVIEW Patient doing well in the program. Will continue to monitor for progress.

## 2017-06-15 ENCOUNTER — Encounter (HOSPITAL_COMMUNITY): Payer: Medicare Other

## 2017-06-17 ENCOUNTER — Encounter (HOSPITAL_COMMUNITY)
Admission: RE | Admit: 2017-06-17 | Discharge: 2017-06-17 | Disposition: A | Discharge status: Home / Self Care | Payer: Medicare Other | Source: Ambulatory Visit | Attending: Cardiology | Admitting: Cardiology

## 2017-06-17 DIAGNOSIS — I5022 Chronic systolic (congestive) heart failure: Secondary | ICD-10-CM

## 2017-06-17 NOTE — Progress Notes (Signed)
Daily Session Note  Patient Details  Name: PATRIZIA PAULE MRN: [id number] Date of Birth: 10-Aug-1951 Referring Provider:     CARDIAC REHAB PHASE II ORIENTATION from 04/14/2017 in [place]  Referring Provider  Dr. Harl Bowie      Encounter Date: 06/17/2017  Check In:     Session Check In - 06/17/17 1112      Check-In   Location [facility]   Staff Present Diane Angelina Pih, MS, EP, [facility], Exercise Physiologist;Shadrach Bartunek Luther Parody, BS, EP, Exercise Physiologist   Supervising physician immediately available to respond to emergencies See telemetry face sheet for immediately available MD   Medication changes reported     No   Fall or balance concerns reported    No   Warm-up and Cool-down Performed as group-led instruction   Resistance Training Performed Yes   VAD Patient? No     Pain Assessment   Currently in Pain? No/denies   Pain Score 0-No pain   Multiple Pain Sites No      Capillary Blood Glucose: No results found for this or any previous visit (from the past 24 hour(s)).    History  Smoking Status  . Never Smoker  Smokeless Tobacco  . Never Used    Goals Met:  Independence with exercise equipment Exercise tolerated well No report of cardiac concerns or symptoms Strength training completed today  Goals Unmet:  Not Applicable  Comments: Check out 1200   Dr. Kate Sable is Medical Director for [place] and Pulmonary Rehab.

## 2017-06-20 ENCOUNTER — Encounter (HOSPITAL_COMMUNITY)
Admission: RE | Admit: 2017-06-20 | Discharge: 2017-06-20 | Disposition: A | Discharge status: Home / Self Care | Payer: Medicare Other | Source: Ambulatory Visit | Attending: Cardiology | Admitting: Cardiology

## 2017-06-20 DIAGNOSIS — I5022 Chronic systolic (congestive) heart failure: Secondary | ICD-10-CM | POA: Diagnosis not present

## 2017-06-20 NOTE — Progress Notes (Signed)
Daily Session Note  Patient Details  Name: Pamela Soto MRN: [id number] Date of Birth: June 14, 1951 Referring Provider:     CARDIAC REHAB PHASE II ORIENTATION from 04/14/2017 in [place]  Referring Provider  Dr. Harl Bowie      Encounter Date: 06/20/2017  Check In:     Session Check In - 06/20/17 1111      Check-In   Location [facility]   Staff Present Russella Dar, MS, EP, [facility], Exercise Physiologist;[place] Luther Parody, BS, EP, Exercise Physiologist   Supervising physician immediately available to respond to emergencies See telemetry face sheet for immediately available MD   Medication changes reported     No   Fall or balance concerns reported    No   Warm-up and Cool-down Performed as group-led instruction   Resistance Training Performed Yes   VAD Patient? No     Pain Assessment   Currently in Pain? No/denies   Pain Score 0-No pain   Multiple Pain Sites No      Capillary Blood Glucose: No results found for this or any previous visit (from the past 24 hour(s)).    History  Smoking Status  . Never Smoker  Smokeless Tobacco  . Never Used    Goals Met:  Independence with exercise equipment Exercise tolerated well No report of cardiac concerns or symptoms Strength training completed today  Goals Unmet:  Not Applicable  Comments: Check out 1200   Dr. Kate Sable is Medical Director for [place] and Pulmonary Rehab.

## 2017-06-22 ENCOUNTER — Encounter (HOSPITAL_COMMUNITY)
Admission: RE | Admit: 2017-06-22 | Discharge: 2017-06-22 | Disposition: A | Discharge status: Home / Self Care | Payer: Medicare Other | Source: Ambulatory Visit | Attending: Cardiology | Admitting: Cardiology

## 2017-06-22 DIAGNOSIS — I5022 Chronic systolic (congestive) heart failure: Secondary | ICD-10-CM | POA: Diagnosis not present

## 2017-06-22 NOTE — Progress Notes (Signed)
Daily Session Note  Patient Details  Name: Pamela Soto MRN: [id number] Date of Birth: 1951-07-22 Referring Provider:     CARDIAC REHAB PHASE II ORIENTATION from 04/14/2017 in [place]  Referring Provider  Dr. Harl Bowie      Encounter Date: 06/22/2017  Check In:     Session Check In - 06/22/17 1104      Check-In   Location [facility]   Staff Present Diane Angelina Pih, MS, EP, [facility], Exercise Physiologist;Lailynn [place] Luther Parody, BS, EP, Exercise Physiologist   Supervising physician immediately available to respond to emergencies See telemetry face sheet for immediately available MD   Medication changes reported     No   Fall or balance concerns reported    No   Warm-up and Cool-down Performed as group-led instruction   Resistance Training Performed Yes   VAD Patient? No     Pain Assessment   Currently in Pain? No/denies   Pain Score 0-No pain   Multiple Pain Sites No      Capillary Blood Glucose: No results found for this or any previous visit (from the past 24 hour(s)).    History  Smoking Status  . Never Smoker  Smokeless Tobacco  . Never Used    Goals Met:  Independence with exercise equipment Exercise tolerated well No report of cardiac concerns or symptoms Strength training completed today  Goals Unmet:  Not Applicable  Comments: Check out 1200   Dr. Kate Sable is Medical Director for [place] and Pulmonary Rehab.

## 2017-06-24 ENCOUNTER — Encounter (HOSPITAL_COMMUNITY)
Admission: RE | Admit: 2017-06-24 | Discharge: 2017-06-24 | Disposition: A | Discharge status: Home / Self Care | Payer: Medicare Other | Source: Ambulatory Visit | Attending: Cardiology | Admitting: Cardiology

## 2017-06-24 DIAGNOSIS — I5022 Chronic systolic (congestive) heart failure: Secondary | ICD-10-CM

## 2017-06-24 NOTE — Progress Notes (Signed)
Daily Session Note  Patient Details  Name: Pamela Soto MRN: [id number] Date of Birth: Jul 02, 1951 Referring Provider:     CARDIAC REHAB PHASE II ORIENTATION from 04/14/2017 in [place]  Referring Provider  Dr. Harl Bowie      Encounter Date: 06/24/2017  Check In:     Session Check In - 06/24/17 1053      Check-In   Location [facility]   Staff Present Suzanne Boron, BS, EP, Exercise Physiologist;Diane Coad, MS, EP, [facility], Exercise Physiologist   Supervising physician immediately available to respond to emergencies See telemetry face sheet for immediately available MD   Medication changes reported     No   Fall or balance concerns reported    No   Warm-up and Cool-down Performed as group-led instruction   Resistance Training Performed Yes   VAD Patient? No     Pain Assessment   Currently in Pain? No/denies   Pain Score 0-No pain   Multiple Pain Sites No      Capillary Blood Glucose: No results found for this or any previous visit (from the past 24 hour(s)).    History  Smoking Status  . Never Smoker  Smokeless Tobacco  . Never Used    Goals Met:  Independence with exercise equipment Exercise tolerated well No report of cardiac concerns or symptoms Strength training completed today  Goals Unmet:  Not Applicable  Comments: Check out 1200   Dr. Kate Sable is Medical Director for [place] and Pulmonary Rehab.

## 2017-06-27 ENCOUNTER — Encounter (HOSPITAL_COMMUNITY)
Admission: RE | Admit: 2017-06-27 | Discharge: 2017-06-27 | Disposition: A | Discharge status: Home / Self Care | Payer: Medicare Other | Source: Ambulatory Visit | Attending: Cardiology | Admitting: Cardiology

## 2017-06-27 DIAGNOSIS — I5022 Chronic systolic (congestive) heart failure: Secondary | ICD-10-CM | POA: Diagnosis not present

## 2017-06-27 NOTE — Progress Notes (Signed)
Daily Session Note  Patient Details  Name: Pamela Soto MRN: [id number] Date of Birth: May 09, 1951 Referring Provider:     CARDIAC REHAB PHASE II ORIENTATION from 04/14/2017 in [place]  Referring Provider  Dr. Harl Bowie      Encounter Date: 06/27/2017  Check In:     Session Check In - 06/27/17 1107      Check-In   Location [facility]   Staff Present Russella Dar, MS, EP, [facility], Exercise Physiologist;Briyana Badman Luther Parody, BS, EP, Exercise Physiologist   Supervising physician immediately available to respond to emergencies See telemetry face sheet for immediately available MD   Medication changes reported     No   Fall or balance concerns reported    No   Warm-up and Cool-down Performed as group-led instruction   Resistance Training Performed Yes   VAD Patient? No     Pain Assessment   Currently in Pain? No/denies   Pain Score 0-No pain   Multiple Pain Sites No      Capillary Blood Glucose: No results found for this or any previous visit (from the past 24 hour(s)).    History  Smoking Status  . Never Smoker  Smokeless Tobacco  . Never Used    Goals Met:  Independence with exercise equipment Exercise tolerated well No report of cardiac concerns or symptoms Strength training completed today  Goals Unmet:  Not Applicable  Comments: Check out 1200   Dr. Kate Sable is Medical Director for [place] and Pulmonary Rehab.

## 2017-06-29 ENCOUNTER — Encounter (HOSPITAL_COMMUNITY)
Admission: RE | Admit: 2017-06-29 | Discharge: 2017-06-29 | Disposition: A | Discharge status: Home / Self Care | Payer: Medicare Other | Source: Ambulatory Visit | Attending: Cardiology | Admitting: Cardiology

## 2017-06-29 DIAGNOSIS — I5022 Chronic systolic (congestive) heart failure: Secondary | ICD-10-CM | POA: Diagnosis not present

## 2017-06-29 NOTE — Progress Notes (Addendum)
Daily Session Note  Patient Details  Name: Pamela Soto MRN: [id number] Date of Birth: 10-Feb-1951 Referring Provider:     CARDIAC REHAB PHASE II ORIENTATION from 04/14/2017 in [place]  Referring Provider  Dr. Harl Bowie      Encounter Date: 06/29/2017  Check In:     Session Check In - 06/29/17 1107      Check-In   Location [facility]   Staff Present Russella Dar, MS, EP, [facility], Exercise Physiologist;Conner Muegge Luther Parody, BS, EP, Exercise Physiologist   Supervising physician immediately available to respond to emergencies See telemetry face sheet for immediately available MD   Medication changes reported     No   Fall or balance concerns reported    No   Warm-up and Cool-down Performed as group-led instruction   Resistance Training Performed Yes   VAD Patient? No     Pain Assessment   Currently in Pain? No/denies   Pain Score 0-No pain   Multiple Pain Sites No      Capillary Blood Glucose: No results found for this or any previous visit (from the past 24 hour(s)).    History  Smoking Status  . Never Smoker  Smokeless Tobacco  . Never Used    Goals Met:  Independence with exercise equipment Exercise tolerated well No report of cardiac concerns or symptoms Strength training completed today  Goals Unmet:  Not Applicable  Comments: Check out 1200. Hip and Lower back pain 4/10.    Dr. Kate Sable is Medical Director for [facility] Card[facility] and Pulmonary Rehab.

## 2017-07-01 ENCOUNTER — Encounter (HOSPITAL_COMMUNITY)
Admission: RE | Admit: 2017-07-01 | Discharge: 2017-07-01 | Disposition: A | Discharge status: Home / Self Care | Payer: Medicare Other | Source: Ambulatory Visit | Attending: Cardiology | Admitting: Cardiology

## 2017-07-01 ENCOUNTER — Encounter: Payer: Self-pay | Admitting: Adult Health

## 2017-07-01 ENCOUNTER — Ambulatory Visit (INDEPENDENT_AMBULATORY_CARE_PROVIDER_SITE_OTHER): Payer: Medicare Other | Admitting: Adult Health

## 2017-07-01 VITALS — BP 124/78 | HR 84 | Ht 65.5 in | Wt 257.0 lb

## 2017-07-01 DIAGNOSIS — I10 Essential (primary) hypertension: Secondary | ICD-10-CM

## 2017-07-01 DIAGNOSIS — I43 Cardiomyopathy in diseases classified elsewhere: Secondary | ICD-10-CM | POA: Diagnosis not present

## 2017-07-01 DIAGNOSIS — R5383 Other fatigue: Secondary | ICD-10-CM | POA: Diagnosis not present

## 2017-07-01 DIAGNOSIS — I5022 Chronic systolic (congestive) heart failure: Secondary | ICD-10-CM | POA: Diagnosis not present

## 2017-07-01 DIAGNOSIS — I482 Chronic atrial fibrillation, unspecified: Secondary | ICD-10-CM

## 2017-07-01 DIAGNOSIS — I2510 Atherosclerotic heart disease of native coronary artery without angina pectoris: Secondary | ICD-10-CM | POA: Diagnosis not present

## 2017-07-01 DIAGNOSIS — R7302 Impaired glucose tolerance (oral): Secondary | ICD-10-CM | POA: Diagnosis not present

## 2017-07-01 MED ORDER — POTASSIUM CHLORIDE CRYS ER 20 MEQ PO TBCR: 20.0000 meq | EXTENDED_RELEASE_TABLET | Freq: Every day | ORAL | 1 refills | Status: DC

## 2017-07-01 MED ORDER — FUROSEMIDE 40 MG PO TABS: 40.0000 mg | ORAL_TABLET | Freq: Every day | ORAL | 1 refills | Status: DC

## 2017-07-01 MED ORDER — BISOPROLOL FUMARATE 5 MG PO TABS: 5.0000 mg | ORAL_TABLET | Freq: Every day | ORAL | 1 refills | Status: DC

## 2017-07-01 MED ORDER — ATORVASTATIN CALCIUM 20 MG PO TABS: 20.0000 mg | ORAL_TABLET | Freq: Every day | ORAL | 1 refills | Status: DC

## 2017-07-01 MED ORDER — LISINOPRIL 5 MG PO TABS: 5.0000 mg | ORAL_TABLET | Freq: Every day | ORAL | 1 refills | Status: DC

## 2017-07-01 MED ORDER — CLOPIDOGREL BISULFATE 75 MG PO TABS: 75.0000 mg | ORAL_TABLET | Freq: Every day | ORAL | 1 refills | Status: DC

## 2017-07-01 MED ORDER — APIXABAN 5 MG PO TABS
5.0000 mg | ORAL_TABLET | Freq: Two times a day (BID) | ORAL | 3 refills | Status: DC
Start: 2017-07-01 — End: 2017-09-13

## 2017-07-01 NOTE — Progress Notes (Signed)
Cardiology Office Note   Date:  07/01/2017   ID:  MAURI TEMKIN, DOB 09/23/1951, MRN [id number]  PCP:  Flossie Buffy, NP  Cardiologist:  [place] Chief Complaint  Patient presents with  . Cardiomyopathy  . Coronary Artery Disease  . Atrial Fibrillation      History of Present Illness: Pamela Soto is a 66 y.o. female who presents for ongoing assessment and management of nonischemic cardiomyopathy, with chronic systolic heart failure, most recent echocardiogram revealing EF of 35% to 40%. Most recent cardiac catheterization in February 2018 revealed distal 100% LAD, proximal RCA 40%, OM 3, 40%. Patient also had a right heart cath revealing PA pressure of 37 mmHg, PCWP a 26 mmHg. Cardiac index 1.73. CAD is being managed medically. Other history includes COPD, paroxysmal atrial fibrillation, hyperlipidemia, diabetes type 2, and obstructive sleep apnea.  Was last seen by Dr. Harl Bowie 04/07/2017. At that time the patient's bisoprolol was increased to 5 mg daily, Plavix was continued in the setting of recent non-ST elevation MI. Patient was continued on ELIQUIS with CAHDS VASC Score of 5. Atorvastatin was decreased to 20 mg due to nonspecific myalgia. Patient was also found to have some hypoxia in the office with a room air O2 sat of 88%. Was referred to primary care to be considered for home O2.  Patient has been undergoing cardiac rehabilitation, tolerating it well, no reported cardiac concerns.  She comes today without any complaints as well. She is enjoying cardiac rehabilitation. She is concerned about her weight and would like to get off some of medications. She has not acted at home only through cardiac rehabilitation. She is medically compliant. She denies any chest pain dyspnea on exertion dizziness fatigue or bleeding.  Past Medical History:  Diagnosis Date  . Acid reflux   . Hypertension     Past Surgical History:  Procedure Laterality Date  . BREAST BIOPSY Right    normal  result  . CESAREAN SECTION    . CHOLECYSTECTOMY  11/06/2012   Procedure: LAPAROSCOPIC CHOLECYSTECTOMY WITH INTRAOPERATIVE CHOLANGIOGRAM;  Surgeon: Pedro Earls, MD;  Location: [facility];  Service: General;  Laterality: N/A;  . RIGHT/LEFT HEART CATH AND CORONARY ANGIOGRAPHY N/A 01/10/2017   Procedure: Right/Left Heart Cath and Coronary Angiography;  Surgeon: Belva Crome, MD;  Location: [place] [facility];  Service: Cardiovascular;  Laterality: N/A;  . UMBILICAL HERNIA REPAIR  11/06/2012   Procedure: HERNIA REPAIR UMBILICAL ADULT;  Surgeon: Pedro Earls, MD;  Location: [facility];  Service: General;;     Current Outpatient Prescriptions  Medication Sig Dispense Refill  . acetaminophen (TYLENOL) 500 MG tablet Take 1 tablet (500 mg total) by mouth every 6 (six) hours as needed. 30 tablet 0  . albuterol (PROVENTIL) (2.5 MG/3ML) 0.083% nebulizer solution Take 3 mLs (2.5 mg total) by nebulization every 6 (six) hours as needed for wheezing or shortness of breath. 75 mL 12  . atorvastatin (LIPITOR) 20 MG tablet Take 1 tablet (20 mg total) by mouth daily. 90 tablet 1  . bisoprolol (ZEBETA) 5 MG tablet Take 1 tablet (5 mg total) by mouth daily. 90 tablet 1  . clopidogrel (PLAVIX) 75 MG tablet TAKE ONE TABLET BY MOUTH DAILY. 30 tablet 6  . Coenzyme Q10 (CO Q-10) 50 MG CAPS Take 1 capsule by mouth 2 (two) times daily after a meal. 60 each 0  . ELIQUIS 5 MG TABS tablet TAKE ONE TABLET BY MOUTH TWICE DAILY. 60 tablet 3  . furosemide (LASIX)  40 MG tablet TAKE ONE TABLET BY MOUTH DAILY. 30 tablet 6  . potassium chloride SA (K-DUR,KLOR-CON) 20 MEQ tablet TAKE ONE TABLET BY MOUTH DAILY. 30 tablet 6  . tiotropium (SPIRIVA HANDIHALER) 18 MCG inhalation capsule Place 1 capsule (18 mcg total) into inhaler and inhale daily. 30 capsule 12  . lisinopril (PRINIVIL,ZESTRIL) 5 MG tablet Take 1 tablet (5 mg total) by mouth daily. 90 tablet 3   No current facility-administered medications for this visit.     Allergies:    Patient has no known allergies.    Social History:  The patient  reports that she has never smoked. She has never used smokeless tobacco. She reports that she does not drink alcohol or use drugs.   Family History:  The patient's family history includes Asthma in her father; Cancer in her father and sister; Heart attack in her mother; Heart attack (age of onset: 63) in her brother; Heart disease (age of onset: 69) in her mother.    ROS: All other systems are reviewed and negative. Unless otherwise mentioned in H&P    PHYSICAL EXAM: VS:  BP 124/78   Pulse 84   Ht 5' 5.5" (1.664 m)   Wt 257 lb (116.6 kg)   SpO2 92%   BMI 42.12 kg/m  , BMI Body mass index is 42.12 kg/m. GEN: Well nourished, well developed, in no acute distress Obese. HEENT: normal  Neck: no JVD, carotid bruits, or masses Cardiac: RRR; no murmurs, rubs, or gallops,no edema  Respiratory:  clear to auscultation bilaterally, normal work of breathing GI: soft, nontender, nondistended, + BS MS: no deformity or atrophy  Skin: warm and dry, no rash Neuro:  Strength and sensation are intact Psych: euthymic mood, full affect   Recent Labs: 01/05/2017: B Natriuretic Peptide 259.0 01/09/2017: ALT 25 01/12/2017: Magnesium 2.1 01/20/2017: BUN 17; Creatinine, Ser 1.22; Hemoglobin 14.4; NT-Pro BNP 344; Platelets 277; Potassium 4.2; Sodium 142 02/17/2017: TSH 4.35    Lipid Panel    Component Value Date/Time   CHOL 210 (H) 01/08/2017 0602   TRIG 118 01/08/2017 0602   HDL 57 01/08/2017 0602   CHOLHDL 3.7 01/08/2017 0602   VLDL 24 01/08/2017 0602   LDLCALC 129 (H) 01/08/2017 0602      Wt Readings from Last 3 Encounters:  07/01/17 257 lb (116.6 kg)  04/14/17 258 lb (117 kg)  04/07/17 254 lb (115.2 kg)    Other studies Reviewed: Echocardiogram 01-15-2017 Left ventricle: The cavity size was mildly dilated. Wall   thickness was increased in a pattern of moderate LVH. Systolic   function was moderately reduced. The estimated  ejection fraction   was in the range of 35% to 40%. Diffuse hypokinesis. - Aortic valve: Mildly calcified annulus. Trileaflet; mildly   thickened leaflets. - Mitral valve: There was mild regurgitation. - Left atrium: The atrium was moderately dilated. - Atrial septum: The septum bowed from left to right, consistent   with increased left atrial pressure. - Pulmonic valve: There was mild regurgitation.  Cardiac Cath 01/10/2017 Conclusion     Dist LAD lesion, 100 %stenosed.  Prox RCA lesion, 40 %stenosed.  3rd Mrg lesion, 40 %stenosed.  There is severe left ventricular systolic dysfunction.  The left ventricular ejection fraction is less than 25% by visual estimate.  LV end diastolic pressure is severely elevated.  Hemodynamic findings consistent with moderate pulmonary hypertension.    Acute atrial fibrillation developed on the cath table during right heart/right atrial pressure recording. Rate 118 bpm. Atrial  fibrillation was treated with one bolus of IV amiodarone.  40% proximal RCA stenosis.  Tortuous but normal circumflex  100% distal/apical LAD stenosis. This lesion is probably chronic but difficult to determine age. There is also a possibility that it is acute embolic occlusion from a catheter-based thrombus. For that reason additional heparin and then Aggrastat bolus was given as the patient complained of chest pressure post procedure.  Chest pressure post procedure is similar to prior episodes.  Severe LV dysfunction with EF less than 25%. Contraction pattern appears global.  Arterial blood gas demonstrated severe CO2 retention, pCO2 67.      ASSESSMENT AND PLAN:  1.  CAD: Cardiac catheterization as above. The patient is without complaint and is able to tolerate cardiac rehabilitation without recurrent discomfort dyspnea or fatigue. The patient will continue current medication regimen, I will repeat her echocardiogram for changes in LV function with hopeful  improvement. The patient will also have labs completed to include BMET, CBC, lipids and LFTs.  Continue Plavix, beta blocker, and ACE inhibitor.  2. Nonischemic cardiomyopathy: Most recent ejection fraction 40% per echocardiogram in February 2018. This is being repeated. The patient denies any fluid retention or symptoms related to decompensated heart failure. She denies palpitations or rapid heart rhythm. Continue medication regimen as above.  3. Paroxysmal atrial fibrillation: Heart rate is well controlled currently. It rises appropriately during cardiac rehabilitation based upon report she brings with her today. She will continue ELIQUIS 5 mg twice a day. Labs as above.     Current medicines are reviewed at length with the patient today.    Labs/ tests ordered today include:  Phill Myron. [place] Pugh, ANP, AACC   07/01/2017 2:39 PM    [facility] [address], [place],  [address] Phone: [phone]; Fax: [phone]

## 2017-07-01 NOTE — Patient Instructions (Addendum)
Your physician recommends that you schedule a follow-up appointment in:  With Dr Harl Bowie as planned on 08/18/17 at 3 pm   [facility] has requested that you have an echocardiogram. Echocardiography is a painless test that uses sound waves to create images of your heart. It provides your doctor with information about the size and shape of your heart and how well your heart's chambers and valves are working. This procedure takes approximately one hour. There are no restrictions for this procedure.    Get FASTING lab work   Your physician recommends that you continue on your current medications as directed. Please refer to the Current Medication list given to you today.     Thank you for choosing [facility]!

## 2017-07-01 NOTE — Progress Notes (Signed)
Daily Session Note  Patient Details  Name: Pamela Soto MRN: [id number] Date of Birth: [birth year] Referring Provider:     CARDIAC REHAB PHASE II ORIENTATION from 04/14/2017 in [place]  Referring Provider  Dr. Harl Bowie      Encounter Date: 07/01/2017  Check In:     Session Check In - 07/01/17 1103      Check-In   Location [facility]   Staff Present Diane Angelina Pih, MS, EP, [facility], Exercise Physiologist;Yahye Siebert Luther Parody, BS, EP, Exercise Physiologist   Supervising physician immediately available to respond to emergencies See telemetry face sheet for immediately available MD   Medication changes reported     No   Fall or balance concerns reported    No   Warm-up and Cool-down Performed as group-led instruction   Resistance Training Performed Yes   VAD Patient? No     Pain Assessment   Currently in Pain? No/denies   Pain Score 0-No pain   Multiple Pain Sites No      Capillary Blood Glucose: No results found for this or any previous visit (from the past 24 hour(s)).    History  Smoking Status  . Never Smoker  Smokeless Tobacco  . Never Used    Goals Met:  Independence with exercise equipment Exercise tolerated well No report of cardiac concerns or symptoms Strength training completed today  Goals Unmet:  Not Applicable  Comments: Check out 1200   Dr. Kate Sable is Medical Director for [place] and Pulmonary Rehab.

## 2017-07-04 ENCOUNTER — Encounter (HOSPITAL_COMMUNITY): Payer: Medicare Other

## 2017-07-06 ENCOUNTER — Encounter (HOSPITAL_COMMUNITY)
Admission: RE | Admit: 2017-07-06 | Discharge: 2017-07-06 | Disposition: A | Discharge status: Home / Self Care | Payer: Medicare Other | Source: Ambulatory Visit | Attending: Cardiology | Admitting: Cardiology

## 2017-07-06 ENCOUNTER — Ambulatory Visit (HOSPITAL_COMMUNITY)
Admission: RE | Admit: 2017-07-06 | Discharge: 2017-07-06 | Disposition: A | Discharge status: Home / Self Care | Payer: Medicare Other | Source: Ambulatory Visit | Attending: Adult Health | Admitting: Adult Health

## 2017-07-06 DIAGNOSIS — I482 Chronic atrial fibrillation, unspecified: Secondary | ICD-10-CM

## 2017-07-06 DIAGNOSIS — I5022 Chronic systolic (congestive) heart failure: Secondary | ICD-10-CM | POA: Insufficient documentation

## 2017-07-06 NOTE — Progress Notes (Signed)
Daily Session Note  Patient Details  Name: Pamela Soto MRN: [id number] Date of Birth: 11-Feb-1951 Referring Provider:     CARDIAC REHAB PHASE II ORIENTATION from 04/14/2017 in [place]  Referring Provider  Dr. Harl Bowie      Encounter Date: 07/06/2017  Check In:     Session Check In - 07/06/17 1100      Check-In   Location [facility]   Staff Present Diane Angelina Pih, MS, EP, [facility], Exercise Physiologist;Adis Sturgill Wynetta Emery, RN, BSN   Supervising physician immediately available to respond to emergencies See telemetry face sheet for immediately available MD   Medication changes reported     No   Fall or balance concerns reported    No   Tobacco Cessation No Change   Warm-up and Cool-down Performed as group-led instruction   Resistance Training Performed Yes   VAD Patient? No     Pain Assessment   Currently in Pain? No/denies   Pain Score 0-No pain   Multiple Pain Sites No      Capillary Blood Glucose: No results found for this or any previous visit (from the past 24 hour(s)).    History  Smoking Status  . Never Smoker  Smokeless Tobacco  . Never Used    Goals Met:  Independence with exercise equipment Exercise tolerated well No report of cardiac concerns or symptoms Strength training completed today  Goals Unmet:  Not Applicable  Comments: Check out 1200.   Dr. Kate Sable is Medical Director for [facility] Cardiac and Pulmonary Rehab.

## 2017-07-06 NOTE — Progress Notes (Signed)
*  PRELIMINARY RESULTS* Echocardiogram 2D Echocardiogram has been performed.  Leavy Cella 07/06/2017, 1:41 PM

## 2017-07-06 NOTE — Progress Notes (Signed)
Cardiac Individual Treatment Plan  Patient Details  Name: Pamela Soto MRN: [id number] Date of Birth: 08/13/1951 Referring Provider:     CARDIAC REHAB PHASE II ORIENTATION from 04/14/2017 in [place]  Referring Provider  Dr. Harl Bowie      Initial Encounter Date:    CARDIAC REHAB PHASE II ORIENTATION from 04/14/2017 in [place]  Date  04/14/17  Referring Provider  Dr. Harl Bowie      Visit Diagnosis: Chronic systolic heart failure ([place])  Patient's Home Medications on Admission:  Current Outpatient Prescriptions:  .  acetaminophen (TYLENOL) 500 MG tablet, Take 1 tablet (500 mg total) by mouth every 6 (six) hours as needed., Disp: 30 tablet, Rfl: 0 .  albuterol (PROVENTIL) (2.5 MG/3ML) 0.083% nebulizer solution, Take 3 mLs (2.5 mg total) by nebulization every 6 (six) hours as needed for wheezing or shortness of breath., Disp: 75 mL, Rfl: 12 .  apixaban (ELIQUIS) 5 MG TABS tablet, Take 1 tablet (5 mg total) by mouth 2 (two) times daily., Disp: 60 tablet, Rfl: 3 .  atorvastatin (LIPITOR) 20 MG tablet, Take 1 tablet (20 mg total) by mouth daily., Disp: 90 tablet, Rfl: 1 .  bisoprolol (ZEBETA) 5 MG tablet, Take 1 tablet (5 mg total) by mouth daily., Disp: 90 tablet, Rfl: 1 .  clopidogrel (PLAVIX) 75 MG tablet, Take 1 tablet (75 mg total) by mouth daily., Disp: 90 tablet, Rfl: 1 .  Coenzyme Q10 (CO Q-10) 50 MG CAPS, Take 1 capsule by mouth 2 (two) times daily after a meal., Disp: 60 each, Rfl: 0 .  furosemide (LASIX) 40 MG tablet, Take 1 tablet (40 mg total) by mouth daily., Disp: 90 tablet, Rfl: 1 .  lisinopril (PRINIVIL,ZESTRIL) 5 MG tablet, Take 1 tablet (5 mg total) by mouth daily., Disp: 90 tablet, Rfl: 1 .  potassium chloride SA (K-DUR,KLOR-CON) 20 MEQ tablet, Take 1 tablet (20 mEq total) by mouth daily., Disp: 90 tablet, Rfl: 1 .  tiotropium (SPIRIVA HANDIHALER) 18 MCG inhalation capsule, Place 1 capsule (18 mcg total) into inhaler and inhale  daily., Disp: 30 capsule, Rfl: 12  Past Medical History: Past Medical History:  Diagnosis Date  . Acid reflux   . Hypertension     Tobacco Use: History  Smoking Status  . Never Smoker  Smokeless Tobacco  . Never Used    Labs: Recent Review Flowsheet Data    Labs for ITP Cardiac and Pulmonary Rehab Latest Ref Rng & Units 01/05/2017 01/08/2017 01/10/2017 01/10/2017 02/17/2017   Cholestrol 0 - 200 mg/dL - 210(H) - - -   LDLCALC 0 - 99 mg/dL - 129(H) - - -   HDL >40 mg/dL - 57 - - -   Trlycerides <150 mg/dL - 118 - - -   Hemoglobin A1c 4.6 - 6.5 % 6.1(H) - - - 6.7(H)   PHART 7.350 - 7.450 - - 7.349(L) - -   PCO2ART 32.0 - 48.0 mmHg - - 68.5(HH) - -   HCO3 20.0 - 28.0 mmol/L - - 37.8(H) 39.3(H) -   TCO2 0 - 100 mmol/L - - 40 42 -   O2SAT % - - 94.0 56.0 -      Capillary Blood Glucose: Lab Results  Component Value Date   GLUCAP 99 11/05/2012     Exercise Target Goals:    Exercise Program Goal: Individual exercise prescription set with THRR, safety & activity barriers. Participant demonstrates ability to understand and report RPE using BORG scale, to self-measure pulse accurately,  and to acknowledge the importance of the exercise prescription.  Exercise Prescription Goal: Starting with aerobic activity 30 plus minutes a day, 3 days per week for initial exercise prescription. Provide home exercise prescription and guidelines that participant acknowledges understanding prior to discharge.  Activity Barriers & Risk Stratification:     Activity Barriers & Cardiac Risk Stratification - 04/14/17 1503      Activity Barriers & Cardiac Risk Stratification   Activity Barriers Shortness of Breath   Cardiac Risk Stratification High      6 Minute Walk:   Oxygen Initial Assessment:   Oxygen Re-Evaluation:   Oxygen Discharge (Final Oxygen Re-Evaluation):   Initial Exercise Prescription:     Initial Exercise Prescription - 04/14/17 1400      Date of Initial Exercise RX  and Referring Provider   Date 04/14/17   Referring Provider Dr. Harl Bowie     NuStep   Level 2   SPM 9   Minutes 15   METs 1.7     Arm Ergometer   Level 1.5   Watts 11   RPM 11   Minutes 20   METs 1.8     Prescription Details   Frequency (times per week) 3   Duration Progress to 30 minutes of continuous aerobic without signs/symptoms of physical distress     Intensity   THRR 40-80% of Max Heartrate 106-123-139   Ratings of Perceived Exertion 11-13   Perceived Dyspnea 0-4     Progression   Progression Continue progressive overload as per policy without signs/symptoms or physical distress.     Resistance Training   Training Prescription Yes   Weight 1   Reps 10-15      Perform Capillary Blood Glucose checks as needed.  Exercise Prescription Changes:      Exercise Prescription Changes    Row Name 04/25/17 1400 05/09/17 1400 05/19/17 1500 06/06/17 1400 06/29/17 1400     Response to Exercise   Blood Pressure (Admit) 130/72 132/60  95 O2 100/60  93 O2 128/66  90 O2 116/66  93 O2   Blood Pressure (Exercise) 154/76 110/58  91 O2 128/64  93 O2 122/72  95 O2 124/68  93 O2   Blood Pressure (Exit) 130/72 120/58  91 O2 120/50  94 O2 122/70  90 O2 114/68  93 O2   Heart Rate (Admit) 79 bpm 78 bpm 77 bpm 72 bpm 72 bpm   Heart Rate (Exercise) 94 bpm 84 bpm 97 bpm 88 bpm 91 bpm   Heart Rate (Exit) 83 bpm 87 bpm 84 bpm 77 bpm 65 bpm   Rating of Perceived Exertion (Exercise) 12 10 10 10 10    Duration  - Progress to 30 minutes of  aerobic without signs/symptoms of physical distress Progress to 30 minutes of  aerobic without signs/symptoms of physical distress Progress to 30 minutes of  aerobic without signs/symptoms of physical distress Progress to 30 minutes of  aerobic without signs/symptoms of physical distress   Intensity  - THRR unchanged THRR unchanged THRR unchanged THRR unchanged     Progression   Progression  - Continue to progress workloads to maintain intensity  without signs/symptoms of physical distress. Continue to progress workloads to maintain intensity without signs/symptoms of physical distress. Continue to progress workloads to maintain intensity without signs/symptoms of physical distress. Continue to progress workloads to maintain intensity without signs/symptoms of physical distress.     Resistance Training   Training Prescription Yes Yes Yes Yes Yes   Weight 1  1 1 1 1    Reps 10-15 10-15 10-15 10-15 10-15     NuStep   Level 1 1 1 2 2    SPM 12 15 12 20 22    Minutes 15 15 15 15 15    METs 3.74 3.73 3.73 3.74 2     Arm Ergometer   Level 1.7 2 2  2.3 2.4   Watts 14 10 14 5 7    RPM 14 10 14 14 14    Minutes 20 20 20 20 20    METs 2.1 1.8 2.1 2.3 2.4     Home Exercise Plan   Plans to continue exercise at Home (comment) Home (comment) Home (comment) Home (comment) Home (comment)   Frequency Add 2 additional days to program exercise sessions. Add 2 additional days to program exercise sessions. Add 2 additional days to program exercise sessions. Add 2 additional days to program exercise sessions. Add 2 additional days to program exercise sessions.      Exercise Comments:      Exercise Comments    Row Name 04/25/17 1452 05/09/17 1424 05/19/17 1527 06/06/17 1500 06/29/17 1423   Exercise Comments Patient is doing well in CR and is being closely monitored.  Patient is doing alright in CR. Her O2 is being monitored and her doctor has recently scheduled an appointment for her.  Patient is maintaining her levels but however has not been progressed due to O2 issues when exerting energy.  Patient is doing very well in CR.  Patient is doing well in CR.       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       Intervention Provide advice, education, support  and counseling about physical activity/exercise needs.;Develop an individualized exercise prescription for aerobic and resistive training based on initial evaluation findings, risk stratification, comorbidities and participant's personal goals.       Expected Outcomes Achievement of increased cardiorespiratory fitness and enhanced flexibility, muscular endurance and strength shown through measurements of functional capacity and personal statement of participant.       Increase Strength and Stamina Yes       Intervention Provide advice, education, support and counseling about physical activity/exercise needs.;Develop an individualized exercise prescription for aerobic and resistive training based on initial evaluation findings, risk stratification, comorbidities and participant's personal goals.       Expected Outcomes Achievement of increased cardiorespiratory fitness and enhanced flexibility, muscular endurance and strength shown through measurements of functional capacity and personal statement of participant.          Exercise Goals Re-Evaluation :     Exercise Goals Re-Evaluation    Row Name 05/25/17 1320 06/13/17 1312 07/06/17 1429         Exercise Goal Re-Evaluation   Exercise Goals Review Increase Physical Activity;Increase Strenth and Stamina Increase Physical Activity;Increase Strenth and Stamina Increase Physical Activity;Increase Strenth and Stamina     Comments Patient has completed 15 sessions. She says she feels stronger. She has not been progressed d/t SOB with exerction. Her O2 Saturation remains above 90 during exercise. Will continue to monitor.  Patient has completed 21 sessions. She is progressing well in the program with increased strength and stamina. She is now able to  stand up during the warm up. Will continue to monitor.  Patient has completed 28 sessions. She has progressed well with increased strength and stamina. She says she feels stronger and is doing more. 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 Will continue to monitor.      Expected Outcomes Patient will complete the program with increased strength, stamina, and activity. Patient will complete the program with continued increased strength, stamina, and activity.  Patient will complete the program with continued increased strength, stamina, and activity.         Discharge Exercise Prescription (Final Exercise Prescription Changes):     Exercise Prescription Changes - 06/29/17 1400      Response to Exercise   Blood Pressure (Admit) 116/66  93 O2   Blood Pressure (Exercise) 124/68  93 O2   Blood Pressure (Exit) 114/68  93 O2   Heart Rate (Admit) 72 bpm   Heart Rate (Exercise) 91 bpm   Heart Rate (Exit) 65 bpm   Rating of Perceived Exertion (Exercise) 10   Duration Progress to 30 minutes of  aerobic without signs/symptoms of physical distress   Intensity THRR unchanged     Progression   Progression Continue to progress workloads to maintain intensity without signs/symptoms of physical distress.     Resistance Training   Training Prescription Yes   Weight 1   Reps 10-15     NuStep   Level 2   SPM 22   Minutes 15   METs 2     Arm Ergometer   Level 2.4   Watts 7   RPM 14   Minutes 20   METs 2.4     Home Exercise Plan   Plans to continue exercise at Home (comment)   Frequency Add 2 additional days to program exercise sessions.      Nutrition:  Target Goals: Understanding of nutrition guidelines, daily intake of sodium 1500mg , cholesterol 200mg , calories 30% from fat and 7% or less from saturated fats, daily to have 5 or more servings of fruits and vegetables.  Biometrics:     Pre Biometrics - 04/14/17 1441      Pre Biometrics   Height 5' 5.5" (1.664 m)   Waist Circumference 47 inches   Hip Circumference 51 inches   Waist to Hip Ratio 0.92 %   BMI (Calculated) 42.4   Triceps Skinfold 22 mm   % Body Fat 49.2 %   Grip Strength 46.33 kg    Flexibility 0 in   Single Leg Stand 2 seconds       Nutrition Therapy Plan and Nutrition Goals:   Nutrition Discharge: Rate Your Plate Scores:     Nutrition Assessments - 04/14/17 1506      MEDFICTS Scores   Pre Score 3      Nutrition Goals Re-Evaluation:   Nutrition Goals Discharge (Final Nutrition Goals Re-Evaluation):   Psychosocial: Target Goals: Acknowledge presence or absence of significant depression and/or stress, maximize coping skills, provide positive support system. Participant is able to verbalize types and ability to use techniques and skills needed for reducing stress and depression.  Initial Review & Psychosocial Screening:     Initial Psych Review & Screening - 04/14/17 1510      Initial Review   Current issues with Current Depression     Family Dynamics   Good Support System? Yes     Barriers   Psychosocial barriers to participate in program The patient should benefit from training  in stress management and relaxation.     Screening Interventions   Interventions Encouraged to exercise  Patient feels down at times related to her health. She feels is she could lose some weight and have more energy her psychosocial issues would be resolved.       Quality of Life Scores:     Quality of Life - 04/14/17 1442      Quality of Life Scores   Health/Function Pre 4.8 %   Socioeconomic Pre 12 %   Psych/Spiritual Pre 10.29 %   Family Pre 12 %   GLOBAL Pre 8.57 %      PHQ-9: Recent Review Flowsheet Data    Depression screen [facility] 2/9 04/14/2017 02/17/2017   Decreased Interest 1 0   Down, Depressed, Hopeless 0 0   PHQ - 2 Score 1 0   Altered sleeping 1 -   Tired, decreased energy 3 -   Change in appetite 1 -   Feeling bad or failure about yourself  0 -   Trouble concentrating 1 -   Moving slowly or fidgety/restless 1 -   Suicidal thoughts 0 -   PHQ-9 Score 8 -   Difficult doing work/chores Very difficult -     Interpretation of Total  Score  Total Score Depression Severity:  1-4 = Minimal depression, 5-9 = Mild depression, 10-14 = Moderate depression, 15-19 = Moderately severe depression, 20-27 = Severe depression   Psychosocial Evaluation and Intervention:     Psychosocial Evaluation - 04/14/17 1514      Psychosocial Evaluation & Interventions   Interventions Stress management education;Relaxation education;Encouraged to exercise with the program and follow exercise prescription   Comments Patient feels down at times related to her health. She feels is she could lose some weight and have more energy her psychosocial issues would be resolved. Her PHQ-9 score was 8 and her QOL score was 8.57 indicative of depression.   Expected Outcomes Patient will have increased energy at discharge with improved PHQ-9 and QOL scores.    Continue Psychosocial Services  Follow up required by staff      Psychosocial Re-Evaluation:     Psychosocial Re-Evaluation    Row Name 05/25/17 1319 06/13/17 1314 07/06/17 1431         Psychosocial Re-Evaluation   Current issues with Current Depression Current Depression  -     Comments Patient says she feels down at times d/t having little energy.  Patient says she is feeling better emotionally since her SOB has improved.  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      Expected Outcomes Patient will have improved or unchanged QOL and PHQ-9 scores at discharge.  Patient will have improved PHQ-9 and QOL scores at discharge.  Patient will have improved QOL and PHQ-9 scores at discharge.      Interventions Stress management education;Encouraged to attend Cardiac Rehabilitation for the exercise;Relaxation education Encouraged to attend Cardiac Rehabilitation for the exercise;Stress management education;Relaxation education Relaxation education;Stress management education;Encouraged to attend Cardiac Rehabilitation  for the exercise     Continue Psychosocial Services  Follow up required by staff Follow up required by staff Follow up required by staff        Psychosocial Discharge (Final Psychosocial Re-Evaluation):     Psychosocial Re-Evaluation - 07/06/17 1431      Psychosocial Re-Evaluation   Comments Patient says she does not  feel depressed 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    Expected Outcomes Patient will have improved QOL and PHQ-9 scores at discharge.    Interventions Relaxation education;Stress management education;Encouraged to attend Cardiac Rehabilitation for the exercise   Continue Psychosocial Services  Follow up required by staff      Vocational Rehabilitation: Provide vocational rehab assistance to qualifying candidates.   Vocational Rehab Evaluation & Intervention:     Vocational Rehab - 04/14/17 1502      Initial Vocational Rehab Evaluation & Intervention   Assessment shows need for Vocational Rehabilitation No      Education: Education Goals: Education classes will be provided on a weekly basis, covering required topics. Participant will state understanding/return demonstration of topics presented.  Learning Barriers/Preferences:     Learning Barriers/Preferences - 04/14/17 1502      Learning Barriers/Preferences   Learning Barriers None   Learning Preferences Skilled Demonstration;Pictoral;Video      Education Topics: Hypertension, Hypertension Reduction -Define heart disease and high blood pressure. Discus how high blood pressure affects the body and ways to reduce high blood pressure.   Exercise and Your Heart -Discuss why it is important to exercise, the FITT principles of exercise, normal and abnormal responses to exercise, and how to exercise safely.   Angina -Discuss definition of angina, causes of angina, treatment of angina, and how to decrease risk of having angina.   CARDIAC REHAB PHASE  II EXERCISE from 06/29/2017 in [place]  Date  04/20/17  Educator  DC  Instruction Review Code  2- meets goals/outcomes      Cardiac Medications -Review what the following cardiac medications are used for, how they affect the body, and side effects that may occur when taking the medications.  Medications include Aspirin, Beta blockers, calcium channel blockers, ACE Inhibitors, angiotensin receptor blockers, diuretics, digoxin, and antihyperlipidemics.   CARDIAC REHAB PHASE II EXERCISE from 06/29/2017 in [place]  Date  04/27/17  Educator  Dc  Instruction Review Code  2- meets goals/outcomes      Congestive Heart Failure -Discuss the definition of CHF, how to live with CHF, the signs and symptoms of CHF, and how keep track of weight and sodium intake.   CARDIAC REHAB PHASE II EXERCISE from 06/29/2017 in [place]  Date  05/04/17  Educator  DC  Instruction Review Code  2- meets goals/outcomes      Heart Disease and Intimacy -Discus the effect sexual activity has on the heart, how changes occur during intimacy as we age, and safety during sexual activity.   CARDIAC REHAB PHASE II EXERCISE from 06/29/2017 in [place]  Date  05/11/17  Educator  DJ  Instruction Review Code  2- meets goals/outcomes      Smoking Cessation / COPD -Discuss different methods to quit smoking, the health benefits of quitting smoking, and the definition of COPD.   CARDIAC REHAB PHASE II EXERCISE from 06/29/2017 in [place]  Date  05/18/17  Educator  Russella Dar  Instruction Review Code  2- meets goals/outcomes      Nutrition I: Fats -Discuss the types of cholesterol, what cholesterol does to the heart, and how cholesterol levels can be controlled.   CARDIAC REHAB PHASE II EXERCISE from 06/29/2017 in [place]  Date  05/25/17  Educator  DC  Instruction Review  Code  2- meets  goals/outcomes      Nutrition II: Labels -Discuss the different components of food labels and how to read food label   CARDIAC REHAB PHASE II EXERCISE from 06/29/2017 in [place]  Date  06/01/17  Educator  DC  Instruction Review Code  2- meets goals/outcomes      Heart Parts and Heart Disease -Discuss the anatomy of the heart, the pathway of blood circulation through the heart, and these are affected by heart disease.   Stress I: Signs and Symptoms -Discuss the causes of stress, how stress may lead to anxiety and depression, and ways to limit stress.   Stress II: Relaxation -Discuss different types of relaxation techniques to limit stress.   CARDIAC REHAB PHASE II EXERCISE from 06/29/2017 in [place]  Date  06/22/17  Educator  [place]  Instruction Review Code  2- meets goals/outcomes      Warning Signs of Stroke / TIA -Discuss definition of a stroke, what the signs and symptoms are of a stroke, and how to identify when someone is having stroke.   CARDIAC REHAB PHASE II EXERCISE from 06/29/2017 in [place]  Date  06/29/17  Educator  [place]  Instruction Review Code  2- meets goals/outcomes      Knowledge Questionnaire Score:     Knowledge Questionnaire Score - 04/14/17 1502      Knowledge Questionnaire Score   Pre Score 20/24      Core Components/Risk Factors/Patient Goals at Admission:     Personal Goals and Risk Factors at Admission - 04/14/17 1506      Core Components/Risk Factors/Patient Goals on Admission    Weight Management Obesity;Yes   Intervention Weight Management: Develop a combined nutrition and exercise program designed to reach desired caloric intake, while maintaining appropriate intake of nutrient and fiber, sodium and fats, and appropriate energy expenditure required for the weight goal.   Admit Weight 258 lb (117 kg)   Goal Weight: Short Term 253 lb (114.8 kg)    Goal Weight: Long Term 243 lb (110.2 kg)   Expected Outcomes Short Term: Continue to assess and modify interventions until short term weight is achieved;Long Term: Adherence to nutrition and physical activity/exercise program aimed toward attainment of established weight goal   Improve shortness of breath with ADL's Yes   Intervention Provide education, individualized exercise plan and daily activity instruction to help decrease symptoms of SOB with activities of daily living.   Expected Outcomes Short Term: Achieves a reduction of symptoms when performing activities of daily living.   Develop more efficient breathing techniques such as purse lipped breathing and diaphragmatic breathing; and practicing self-pacing with activity Yes   Intervention Provide education, demonstration and support about specific breathing techniuqes utilized for more efficient breathing. Include techniques such as pursed lipped breathing, diaphragmatic breathing and self-pacing activity.   Expected Outcomes Short Term: Participant will be able to demonstrate and use breathing techniques as needed throughout daily activities.   Heart Failure Yes   Intervention Provide a combined exercise and nutrition program that is supplemented with education, support and counseling about heart failure. Directed toward relieving symptoms such as shortness of breath, decreased exercise tolerance, and extremity edema.   Expected Outcomes Improve functional capacity of life;Short term: Attendance in program 2-3 days a week with increased exercise capacity. Reported lower sodium intake. Reported increased fruit and vegetable intake. Reports medication compliance.   Personal Goal Other Yes   Personal Goal Lose 50 lbs long term. Eat  right   Intervention Patient will attend CR 3 days/week and supplement with exercise at home 2 days/week.   Expected Outcomes Patient will meet her personal goals.      Core Components/Risk Factors/Patient Goals  Review:      Goals and Risk Factor Review    Row Name 05/25/17 1316 06/13/17 1310 07/06/17 1427         Core Components/Risk Factors/Patient Goals Review   Personal Goals Review Weight Management/Obesity;Improve shortness of breath with ADL's;Heart Failure  Lose weight; get stronger; eat healthier Weight Management/Obesity;Improve shortness of breath with ADL's;Heart Failure  Get stronger; lose 50 lbs; eat healthy Weight Management/Obesity;Heart Failure;Improve shortness of breath with ADL's  Get stronger; Lose 50 lbs; eat healthy.     Review Patient has completed 15 sessions gaining 2.6 lbs. She is slowly progressing in the program. She says her SOB has improved since she started the program and she feels stronger. Will continue to monitor for progress.  Patient has completed 21 sessions gaining 3 lbs. She is doing well in the program with improved SOB. She says she is able to walk in from the parking lot now with less SOB. She says she feels better overall and is trying to work on her diet. Will continue to monitor.  Patient has completed 28 sessions gaining 2 lbs. She continues to do well in the program with improved SOB. She is able to do warm-up's and weight's standing now. She says she feels better and is breathing better.      Expected Outcomes Patient will ccomplete the program and begin to work toward her weight loss goal.  Patient will complete the program meeting her personal goals.  Patient will complete the program and start to lose weight with continued improvement in her SOB.        Core Components/Risk Factors/Patient Goals at Discharge (Final Review):      Goals and Risk Factor Review - 07/06/17 1427      Core Components/Risk Factors/Patient Goals Review   Personal Goals Review Weight Management/Obesity;Heart Failure;Improve shortness of breath with ADL's  Get stronger; Lose 50 lbs; eat healthy.   Review Patient has completed 28 sessions gaining 2 lbs. She continues to do  well in the program with improved SOB. She is able to do warm-up's and weight's standing now. She says she feels better and is breathing better.    Expected Outcomes Patient will complete the program and start to lose weight with continued improvement in her SOB.      ITP Comments:     ITP Comments    Row Name 04/27/17 1406           ITP Comments Patient new to program completing 5 sessions. She says she already feels better with decreased pain in her knees. Will continue to monitor for progress.           Comments: ITP 30 Day REVIEW Patient doing well in the program. Will continue to monitor for progress.

## 2017-07-08 ENCOUNTER — Encounter (HOSPITAL_COMMUNITY): Payer: Medicare Other

## 2017-07-13 ENCOUNTER — Encounter (HOSPITAL_COMMUNITY)
Admission: RE | Admit: 2017-07-13 | Discharge: 2017-07-13 | Disposition: A | Discharge status: Home / Self Care | Payer: Medicare Other | Source: Ambulatory Visit | Attending: Cardiology | Admitting: Cardiology

## 2017-07-13 ENCOUNTER — Other Ambulatory Visit (HOSPITAL_COMMUNITY)
Admission: RE | Admit: 2017-07-13 | Discharge: 2017-07-13 | Disposition: A | Discharge status: Home / Self Care | Payer: Medicare Other | Source: Ambulatory Visit | Attending: Adult Health | Admitting: Adult Health

## 2017-07-13 DIAGNOSIS — I2510 Atherosclerotic heart disease of native coronary artery without angina pectoris: Secondary | ICD-10-CM | POA: Insufficient documentation

## 2017-07-13 DIAGNOSIS — R7302 Impaired glucose tolerance (oral): Secondary | ICD-10-CM | POA: Insufficient documentation

## 2017-07-13 DIAGNOSIS — I5022 Chronic systolic (congestive) heart failure: Secondary | ICD-10-CM | POA: Diagnosis not present

## 2017-07-13 LAB — CBC
HEMATOCRIT: 41.5 % (ref 36.0–46.0)
HEMOGLOBIN: 13.3 g/dL (ref 12.0–15.0)
MCH: 30.4 pg (ref 26.0–34.0)
MCHC: 32 g/dL (ref 30.0–36.0)
MCV: 95 fL (ref 78.0–100.0)
Platelets: 266 10*3/uL (ref 150–400)
RBC: 4.37 MIL/uL (ref 3.87–5.11)
RDW: 12.9 % (ref 11.5–15.5)
WBC: 8.7 10*3/uL (ref 4.0–10.5)

## 2017-07-13 LAB — COMPREHENSIVE METABOLIC PANEL
ALT: 15 U/L (ref 14–54)
ANION GAP: 8 (ref 5–15)
AST: 18 U/L (ref 15–41)
Albumin: 3.7 g/dL (ref 3.5–5.0)
Alkaline Phosphatase: 50 U/L (ref 38–126)
BILIRUBIN TOTAL: 1.2 mg/dL (ref 0.3–1.2)
BUN: 16 mg/dL (ref 6–20)
CHLORIDE: 102 mmol/L (ref 101–111)
CO2: 30 mmol/L (ref 22–32)
Calcium: 8.7 mg/dL — ABNORMAL LOW (ref 8.9–10.3)
Creatinine, Ser: 1.04 mg/dL — ABNORMAL HIGH (ref 0.44–1.00)
GFR calc Af Amer: >60 mL/min (ref >60)
GFR calc non Af Amer: 55 mL/min — ABNORMAL LOW (ref >60)
GLUCOSE: 117 mg/dL — AB (ref 65–99)
POTASSIUM: 4 mmol/L (ref 3.5–5.1)
SODIUM: 140 mmol/L (ref 135–145)
TOTAL PROTEIN: 7.4 g/dL (ref 6.5–8.1)

## 2017-07-13 LAB — LIPID PANEL
Cholesterol: 131 mg/dL (ref 0–200)
HDL: 48 mg/dL (ref >40)
LDL CALC: 61 mg/dL (ref 0–99)
TRIGLYCERIDES: 108 mg/dL (ref <150)
Total CHOL/HDL Ratio: 2.7 RATIO
VLDL: 22 mg/dL (ref 0–40)

## 2017-07-13 LAB — TSH: TSH: 2.186 u[IU]/mL (ref 0.350–4.500)

## 2017-07-13 LAB — MAGNESIUM: MAGNESIUM: 2 mg/dL (ref 1.7–2.4)

## 2017-07-13 NOTE — Progress Notes (Signed)
Daily Session Note  Patient Details  Name: GIADA SCHOPPE MRN: [id number] Date of Birth: 09-30-1951 Referring Provider:     [place] from 04/14/2017 in [place]  Referring Provider  Dr. Harl Bowie      Encounter Date: 07/13/2017  Check In:     Session Check In - 07/13/17 1100      Check-In   Location [facility]   Staff Present Aundra Dubin, RN, BSN;Sigurd Pugh Luther Parody, BS, EP, Exercise Physiologist   Supervising physician immediately available to respond to emergencies See telemetry face sheet for immediately available MD   Medication changes reported     No   Fall or balance concerns reported    No   Warm-up and Cool-down Performed as group-led instruction   Resistance Training Performed Yes   VAD Patient? No     Pain Assessment   Currently in Pain? No/denies   Pain Score 0-No pain   Multiple Pain Sites No      Capillary Blood Glucose: No results found for this or any previous visit (from the past 24 hour(s)).    History  Smoking Status  . Never Smoker  Smokeless Tobacco  . Never Used    Goals Met:  Independence with exercise equipment Exercise tolerated well No report of cardiac concerns or symptoms Strength training completed today  Goals Unmet:  Not Applicable  Comments: Check out 1200   Dr. Kate Sable is Medical Director for [place] and Pul[facility] Re[facility].

## 2017-07-14 ENCOUNTER — Telehealth: Payer: Self-pay

## 2017-07-14 LAB — HEMOGLOBIN A1C
Hgb A1c MFr Bld: 6.6 % — ABNORMAL HIGH (ref 4.8–5.6)
Mean Plasma Glucose: 142.72 mg/dL

## 2017-07-14 MED ORDER — ATORVASTATIN CALCIUM 40 MG PO TABS: 40.0000 mg | ORAL_TABLET | Freq: Every day | ORAL | 3 refills | Status: DC

## 2017-07-14 NOTE — Telephone Encounter (Signed)
-----   Message from Lendon Colonel, NP sent at 07/14/2017  8:14 AM EDT ----- HgA 1C is elevated. See PCP to adjust medications. Elevation is similar to prior levels. Increase atorvastatin to 40 mg daily. Cholesterol is still elevated.

## 2017-07-14 NOTE — Telephone Encounter (Signed)
Pt  Made aware of lab results. She voiced understanding. Sent in new RX for her atorvastatin 40 mg daily.

## 2017-07-15 ENCOUNTER — Encounter (HOSPITAL_COMMUNITY): Payer: Medicare Other

## 2017-07-15 DIAGNOSIS — J449 Chronic obstructive pulmonary disease, unspecified: Secondary | ICD-10-CM | POA: Diagnosis not present

## 2017-07-15 DIAGNOSIS — G4733 Obstructive sleep apnea (adult) (pediatric): Secondary | ICD-10-CM | POA: Diagnosis not present

## 2017-07-15 DIAGNOSIS — I10 Essential (primary) hypertension: Secondary | ICD-10-CM | POA: Diagnosis not present

## 2017-07-15 DIAGNOSIS — I4891 Unspecified atrial fibrillation: Secondary | ICD-10-CM | POA: Diagnosis not present

## 2017-07-18 ENCOUNTER — Encounter (HOSPITAL_COMMUNITY)
Admission: RE | Admit: 2017-07-18 | Discharge: 2017-07-18 | Disposition: A | Discharge status: Home / Self Care | Payer: Medicare Other | Source: Ambulatory Visit | Attending: Cardiology | Admitting: Cardiology

## 2017-07-18 DIAGNOSIS — I5022 Chronic systolic (congestive) heart failure: Secondary | ICD-10-CM | POA: Diagnosis not present

## 2017-07-18 NOTE — Progress Notes (Signed)
Daily Session Note  Patient Details  Name: Pamela Soto MRN: [id number] Date of Birth: [birth year] Referring Provider:     CARDIAC REHAB PHASE II ORIENTATION from 04/14/2017 in [place]  Referring Provider  Dr. Harl Bowie      Encounter Date: 07/18/2017  Check In:     Session Check In - 07/18/17 1100      Check-In   Location [facility]   Staff Present Diane Angelina Pih, MS, EP, [facility], Exercise Physiologist;Dione Petron Luther Parody, BS, EP, Exercise Physiologist   Supervising physician immediately available to respond to emergencies See telemetry face sheet for immediately available MD   Medication changes reported     No   Fall or balance concerns reported    No   Warm-up and Cool-down Performed as group-led instruction   Resistance Training Performed Yes   VAD Patient? No     Pain Assessment   Currently in Pain? No/denies   Pain Score 0-No pain   Multiple Pain Sites No      Capillary Blood Glucose: No results found for this or any previous visit (from the past 24 hour(s)).    History  Smoking Status  . Never Smoker  Smokeless Tobacco  . Never Used    Goals Met:  Independence with exercise equipment Exercise tolerated well No report of cardiac concerns or symptoms Strength training completed today  Goals Unmet:  Not Applicable  Comments: Check out 1200   Dr. Kate Sable is Medical Director for [place] and Pulmonary Rehab.

## 2017-07-20 ENCOUNTER — Encounter (HOSPITAL_COMMUNITY)
Admission: RE | Admit: 2017-07-20 | Discharge: 2017-07-20 | Disposition: A | Discharge status: Home / Self Care | Payer: Medicare Other | Source: Ambulatory Visit | Attending: Cardiology | Admitting: Cardiology

## 2017-07-20 DIAGNOSIS — I5022 Chronic systolic (congestive) heart failure: Secondary | ICD-10-CM

## 2017-07-20 NOTE — Progress Notes (Signed)
Daily Session Note  Patient Details  Name: Pamela Soto MRN: [id number] Date of Birth: Sep 18, 1951 Referring Provider:     CARDIAC REHAB PHASE II ORIENTATION from 04/14/2017 in [place]  Referring Provider  Dr. Harl Bowie      Encounter Date: 07/20/2017  Check In:     Session Check In - 07/20/17 1113      Check-In   Location [facility]   Staff Present Aundra Dubin, RN, BSN;Jiyan Walkowski Luther Parody, BS, EP, Exercise Physiologist   Supervising physician immediately available to respond to emergencies See telemetry face sheet for immediately available MD   Medication changes reported     No   Fall or balance concerns reported    No   Warm-up and Cool-down Performed as group-led instruction   Resistance Training Performed Yes   VAD Patient? No     Pain Assessment   Currently in Pain? No/denies   Pain Score 0-No pain   Multiple Pain Sites No      Capillary Blood Glucose: No results found for this or any previous visit (from the past 24 hour(s)).    History  Smoking Status  . Never Smoker  Smokeless Tobacco  . Never Used    Goals Met:  Independence with exercise equipment Exercise tolerated well No report of cardiac concerns or symptoms Strength training completed today  Goals Unmet:  Not Applicable  Comments: Check out 1200   Dr. Kate Sable is Medical Director for [place] and Pulmonary Rehab.

## 2017-07-22 ENCOUNTER — Encounter (HOSPITAL_COMMUNITY)
Admission: RE | Admit: 2017-07-22 | Discharge: 2017-07-22 | Disposition: A | Discharge status: Home / Self Care | Payer: Medicare Other | Source: Ambulatory Visit | Attending: Cardiology | Admitting: Cardiology

## 2017-07-22 DIAGNOSIS — I5022 Chronic systolic (congestive) heart failure: Secondary | ICD-10-CM | POA: Diagnosis not present

## 2017-07-22 NOTE — Progress Notes (Signed)
Daily Session Note  Patient Details  Name: Pamela Soto MRN: [id number] Date of Birth: [birth year] Referring Provider:     CARDIAC REHAB PHASE II ORIENTATION from 04/14/2017 in [place]  Referring Provider  Dr. Harl Bowie      Encounter Date: 07/22/2017  Check In:     Session Check In - 07/22/17 1102      Check-In   Location [facility]   Staff Present Aundra Dubin, RN, BSN;Kainan Patty Luther Parody, BS, EP, Exercise Physiologist   Supervising physician immediately available to respond to emergencies See telemetry face sheet for immediately available MD   Medication changes reported     No   Fall or balance concerns reported    No   Warm-up and Cool-down Performed as group-led instruction   Resistance Training Performed Yes   VAD Patient? No     Pain Assessment   Currently in Pain? No/denies   Pain Score 0-No pain   Multiple Pain Sites No      Capillary Blood Glucose: No results found for this or any previous visit (from the past 24 hour(s)).    History  Smoking Status  . Never Smoker  Smokeless Tobacco  . Never Used    Goals Met:  Independence with exercise equipment Exercise tolerated well No report of cardiac concerns or symptoms Strength training completed today  Goals Unmet:  Not Applicable  Comments: Check out 1200   Dr. Kate Sable is Medical Director for [place] and Pulmonary Rehab.

## 2017-07-25 ENCOUNTER — Encounter (HOSPITAL_COMMUNITY)
Admission: RE | Admit: 2017-07-25 | Discharge: 2017-07-25 | Disposition: A | Discharge status: Home / Self Care | Payer: Medicare Other | Source: Ambulatory Visit | Attending: Cardiology | Admitting: Cardiology

## 2017-07-25 DIAGNOSIS — I5022 Chronic systolic (congestive) heart failure: Secondary | ICD-10-CM

## 2017-07-25 NOTE — Progress Notes (Signed)
Daily Session Note  Patient Details  Name: Pamela Soto MRN: [id number] Date of Birth: January 15, 1951 Referring Provider:     CARDIAC REHAB PHASE II ORIENTATION from 04/14/2017 in [place]  Referring Provider  Dr. Harl Bowie      Encounter Date: 07/25/2017  Check In:     Session Check In - 07/25/17 1106      Check-In   Location [facility]   Staff Present Aundra Dubin, RN, BSN;Guelda Batson Luther Parody, BS, EP, Exercise Physiologist   Supervising physician immediately available to respond to emergencies See telemetry face sheet for immediately available MD   Medication changes reported     No   Fall or balance concerns reported    No   Warm-up and Cool-down Performed as group-led instruction   Resistance Training Performed Yes   VAD Patient? No     Pain Assessment   Currently in Pain? No/denies   Pain Score 0-No pain   Multiple Pain Sites No      Capillary Blood Glucose: No results found for this or any previous visit (from the past 24 hour(s)).    History  Smoking Status  . Never Smoker  Smokeless Tobacco  . Never Used    Goals Met:  Independence with exercise equipment Exercise tolerated well No report of cardiac concerns or symptoms Strength training completed today  Goals Unmet:  Not Applicable  Comments: Check out 1200   Dr. Kate Sable is Medical Director for [place] and Pulmonary Rehab.

## 2017-07-27 ENCOUNTER — Other Ambulatory Visit: Payer: Self-pay

## 2017-07-27 ENCOUNTER — Telehealth: Payer: Self-pay | Admitting: Adult Health

## 2017-07-27 ENCOUNTER — Encounter (HOSPITAL_COMMUNITY)
Admission: RE | Admit: 2017-07-27 | Discharge: 2017-07-27 | Disposition: A | Discharge status: Home / Self Care | Payer: Medicare Other | Source: Ambulatory Visit | Attending: Cardiology | Admitting: Cardiology

## 2017-07-27 DIAGNOSIS — I5022 Chronic systolic (congestive) heart failure: Secondary | ICD-10-CM

## 2017-07-27 MED ORDER — ATORVASTATIN CALCIUM 40 MG PO TABS: 40.0000 mg | ORAL_TABLET | Freq: Every day | ORAL | 3 refills | Status: DC

## 2017-07-27 NOTE — Progress Notes (Signed)
Daily Session Note  Patient Details  Name: ZAMYRA ALLENSWORTH MRN: [id number] Date of Birth: 02-Nov-1951 Referring Provider:     [place] from 04/14/2017 in [place]  Referring Provider  Dr. Harl Bowie      Encounter Date: 07/27/2017  Check In:     Session Check In - 07/27/17 1112      Check-In   Location [facility]   Staff Present Aundra Dubin, RN, BSN;Zarian Colpitts Luther Parody, BS, EP, Exercise Physiologist   Supervising physician immediately available to respond to emergencies See telemetry face sheet for immediately available MD   Medication changes reported     No   Fall or balance concerns reported    No   Warm-up and Cool-down Performed as group-led instruction   Resistance Training Performed Yes   VAD Patient? No     Pain Assessment   Currently in Pain? No/denies   Pain Score 0-No pain   Multiple Pain Sites No      Capillary Blood Glucose: No results found for this or any previous visit (from the past 24 hour(s)).    History  Smoking Status  . Never Smoker  Smokeless Tobacco  . Never Used    Goals Met:  Independence with exercise equipment Exercise tolerated well No report of cardiac concerns or symptoms Strength training completed today  Goals Unmet:  Not Applicable  Comments: Check out 1200   Dr. Kate Sable is Medical Director for [place] and Pulmonary Rehab.

## 2017-07-27 NOTE — Telephone Encounter (Signed)
Sent RX to pharmacy 

## 2017-07-27 NOTE — Telephone Encounter (Signed)
Pt is needing her atorvastatin (LIPITOR) 40 MG tablet [[id number]]  Sent to [facility] in [place]

## 2017-07-29 ENCOUNTER — Encounter (HOSPITAL_COMMUNITY)
Admission: RE | Admit: 2017-07-29 | Discharge: 2017-07-29 | Disposition: A | Discharge status: Home / Self Care | Payer: Medicare Other | Source: Ambulatory Visit | Attending: Cardiology | Admitting: Cardiology

## 2017-07-29 VITALS — Ht 65.5 in | Wt 257.3 lb

## 2017-07-29 DIAGNOSIS — I5022 Chronic systolic (congestive) heart failure: Secondary | ICD-10-CM | POA: Diagnosis not present

## 2017-07-29 NOTE — Progress Notes (Signed)
Cardiac Individual Treatment Plan  Patient Details  Name: Pamela Soto MRN: [id number] Date of Birth: 1951/01/17 Referring Provider:     CARDIAC REHAB PHASE II ORIENTATION from 04/14/2017 in [facility]  Referring Provider  Dr. Harl Bowie      Initial Encounter Date:    CARDIAC REHAB PHASE II ORIENTATION from 04/14/2017 in [facility]  Date  04/14/17  Referring Provider  Dr. Harl Bowie      Visit Diagnosis: Chronic systolic heart failure ([place])  Patient's Home Medications on Admission:  Current Outpatient Prescriptions:  .  acetaminophen (TYLENOL) 500 MG tablet, Take 1 tablet (500 mg total) by mouth every 6 (six) hours as needed., Disp: 30 tablet, Rfl: 0 .  albuterol (PROVENTIL) (2.5 MG/3ML) 0.083% nebulizer solution, Take 3 mLs (2.5 mg total) by nebulization every 6 (six) hours as needed for wheezing or shortness of breath., Disp: 75 mL, Rfl: 12 .  apixaban (ELIQUIS) 5 MG TABS tablet, Take 1 tablet (5 mg total) by mouth 2 (two) times daily., Disp: 60 tablet, Rfl: 3 .  atorvastatin (LIPITOR) 40 MG tablet, Take 1 tablet (40 mg total) by mouth daily., Disp: 90 tablet, Rfl: 3 .  bisoprolol (ZEBETA) 5 MG tablet, Take 1 tablet (5 mg total) by mouth daily., Disp: 90 tablet, Rfl: 1 .  clopidogrel (PLAVIX) 75 MG tablet, Take 1 tablet (75 mg total) by mouth daily., Disp: 90 tablet, Rfl: 1 .  Coenzyme Q10 (CO Q-10) 50 MG CAPS, Take 1 capsule by mouth 2 (two) times daily after a meal., Disp: 60 each, Rfl: 0 .  furosemide (LASIX) 40 MG tablet, Take 1 tablet (40 mg total) by mouth daily., Disp: 90 tablet, Rfl: 1 .  lisinopril (PRINIVIL,ZESTRIL) 5 MG tablet, Take 1 tablet (5 mg total) by mouth daily., Disp: 90 tablet, Rfl: 1 .  potassium chloride SA (K-DUR,KLOR-CON) 20 MEQ tablet, Take 1 tablet (20 mEq total) by mouth daily., Disp: 90 tablet, Rfl: 1 .  tiotropium (SPIRIVA HANDIHALER) 18 MCG inhalation capsule, Place 1 capsule (18 mcg total) into inhaler and inhale  daily., Disp: 30 capsule, Rfl: 12  Past Medical History: Past Medical History:  Diagnosis Date  . Acid reflux   . Hypertension     Tobacco Use: History  Smoking Status  . Never Smoker  Smokeless Tobacco  . Never Used    Labs: Recent Review Flowsheet Data    Labs for ITP Cardiac and Pulmonary Rehab Latest Ref Rng & Units 01/08/2017 01/10/2017 01/10/2017 02/17/2017 07/13/2017   Cholestrol 0 - 200 mg/dL 210(H) - - - 131   LDLCALC 0 - 99 mg/dL 129(H) - - - 61   HDL >40 mg/dL 57 - - - 48   Trlycerides <150 mg/dL 118 - - - 108   Hemoglobin A1c 4.8 - 5.6 % - - - 6.7(H) 6.6(H)   PHART 7.350 - 7.450 - 7.349(L) - - -   PCO2ART 32.0 - 48.0 mmHg - 68.5(HH) - - -   HCO3 20.0 - 28.0 mmol/L - 37.8(H) 39.3(H) - -   TCO2 0 - 100 mmol/L - 40 42 - -   O2SAT % - 94.0 56.0 - -      Capillary Blood Glucose: Lab Results  Component Value Date   GLUCAP 99 11/05/2012     Exercise Target Goals:    Exercise Program Goal: Individual exercise prescription set with THRR, safety & activity barriers. Participant demonstrates ability to understand and report RPE using BORG scale, to self-measure pulse accurately,  and to acknowledge the importance of the exercise prescription.  Exercise Prescription Goal: Starting with aerobic activity 30 plus minutes a day, 3 days per week for initial exercise prescription. Provide home exercise prescription and guidelines that participant acknowledges understanding prior to discharge.  Activity Barriers & Risk Stratification:     Activity Barriers & Cardiac Risk Stratification - 04/14/17 1503      Activity Barriers & Cardiac Risk Stratification   Activity Barriers Shortness of Breath   Cardiac Risk Stratification High      6 Minute Walk:   Oxygen Initial Assessment:   Oxygen Re-Evaluation:   Oxygen Discharge (Final Oxygen Re-Evaluation):   Initial Exercise Prescription:     Initial Exercise Prescription - 04/14/17 1400      Date of Initial Exercise  RX and Referring Provider   Date 04/14/17   Referring Provider Dr. Harl Bowie     NuStep   Level 2   SPM 9   Minutes 15   METs 1.7     Arm Ergometer   Level 1.5   Watts 11   RPM 11   Minutes 20   METs 1.8     Prescription Details   Frequency (times per week) 3   Duration Progress to 30 minutes of continuous aerobic without signs/symptoms of physical distress     Intensity   THRR 40-80% of Max Heartrate 106-123-139   Ratings of Perceived Exertion 11-13   Perceived Dyspnea 0-4     Progression   Progression Continue progressive overload as per policy without signs/symptoms or physical distress.     Resistance Training   Training Prescription Yes   Weight 1   Reps 10-15      Perform Capillary Blood Glucose checks as needed.  Exercise Prescription Changes:      Exercise Prescription Changes    Row Name 04/25/17 1400 05/09/17 1400 05/19/17 1500 06/06/17 1400 06/29/17 1400     Response to Exercise   Blood Pressure (Admit) 130/72 132/60  95 O2 100/60  93 O2 128/66  90 O2 116/66  93 O2   Blood Pressure (Exercise) 154/76 110/58  91 O2 128/64  93 O2 122/72  95 O2 124/68  93 O2   Blood Pressure (Exit) 130/72 120/58  91 O2 120/50  94 O2 122/70  90 O2 114/68  93 O2   Heart Rate (Admit) 79 bpm 78 bpm 77 bpm 72 bpm 72 bpm   Heart Rate (Exercise) 94 bpm 84 bpm 97 bpm 88 bpm 91 bpm   Heart Rate (Exit) 83 bpm 87 bpm 84 bpm 77 bpm 65 bpm   Rating of Perceived Exertion (Exercise) 12 10 10 10 10    Duration  - Progress to 30 minutes of  aerobic without signs/symptoms of physical distress Progress to 30 minutes of  aerobic without signs/symptoms of physical distress Progress to 30 minutes of  aerobic without signs/symptoms of physical distress Progress to 30 minutes of  aerobic without signs/symptoms of physical distress   Intensity  - THRR unchanged THRR unchanged THRR unchanged THRR unchanged     Progression   Progression  - Continue to progress workloads to maintain  intensity without signs/symptoms of physical distress. Continue to progress workloads to maintain intensity without signs/symptoms of physical distress. Continue to progress workloads to maintain intensity without signs/symptoms of physical distress. Continue to progress workloads to maintain intensity without signs/symptoms of physical distress.     Resistance Training   Training Prescription Yes Yes Yes Yes Yes   Weight 1  1 1 1 1    Reps 10-15 10-15 10-15 10-15 10-15     NuStep   Level 1 1 1 2 2    SPM 12 15 12 20 22    Minutes 15 15 15 15 15    METs 3.74 3.73 3.73 3.74 2     Arm Ergometer   Level 1.7 2 2  2.3 2.4   Watts 14 10 14 5 7    RPM 14 10 14 14 14    Minutes 20 20 20 20 20    METs 2.1 1.8 2.1 2.3 2.4     Home Exercise Plan   Plans to continue exercise at Home (comment) Home (comment) Home (comment) Home (comment) Home (comment)   Frequency Add 2 additional days to program exercise sessions. Add 2 additional days to program exercise sessions. Add 2 additional days to program exercise sessions. Add 2 additional days to program exercise sessions. Add 2 additional days to program exercise sessions.   [place] Name 07/19/17 0800             Response to Exercise   Blood Pressure (Admit) 110/68  93 O2       Blood Pressure (Exercise) 134/72  91 O2       Blood Pressure (Exit) 110/64  92 O2       Heart Rate (Admit) 48 bpm       Heart Rate (Exercise) 80 bpm       Heart Rate (Exit) 57 bpm       Rating of Perceived Exertion (Exercise) 13       Duration Progress to 30 minutes of  aerobic without signs/symptoms of physical distress       Intensity THRR unchanged         Progression   Progression Continue to progress workloads to maintain intensity without signs/symptoms of physical distress.         Resistance Training   Training Prescription Yes       Weight 1       Reps 10-15         NuStep   Level 2       SPM 126       Minutes 15       METs 4.2         Arm Ergometer    Level 3       Watts 7       RPM 14       Minutes 20       METs 3         Home Exercise Plan   Plans to continue exercise at Home (comment)       Frequency Add 2 additional days to program exercise sessions.          Exercise Comments:      Exercise Comments    Row Name 04/25/17 1452 05/09/17 1424 05/19/17 1527 06/06/17 1500 06/29/17 1423   Exercise Comments Patient is doing well in CR and is being closely monitored.  Patient is doing alright in CR. Her O2 is being monitored and her doctor has recently scheduled an appointment for her.  Patient is maintaining her levels but however has not been progressed due to O2 issues when exerting energy.  Patient is doing very well in CR.  Patient is doing well in CR.    [place] Name 07/19/17 [phone]           Exercise Comments Patient is doing well in CR.  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       Intervention Provide advice, education, support and counseling about physical activity/exercise needs.;Develop an individualized exercise prescription for aerobic and resistive training based on initial evaluation findings, risk stratification, comorbidities and participant's personal goals.       Expected Outcomes Achievement of increased cardiorespiratory fitness and enhanced flexibility, muscular endurance and strength shown through measurements of functional capacity and personal statement of participant.       Increase Strength and Stamina Yes       Intervention Provide advice, education, support and counseling about physical activity/exercise needs.;Develop an individualized exercise prescription for aerobic and resistive training based on initial evaluation findings, risk stratification, comorbidities and participant's personal goals.        Expected Outcomes Achievement of increased cardiorespiratory fitness and enhanced flexibility, muscular endurance and strength shown through measurements of functional capacity and personal statement of participant.          Exercise Goals Re-Evaluation :     Exercise Goals Re-Evaluation    Row Name 05/25/17 1320 06/13/17 1312 07/06/17 1429 07/29/17 1502       Exercise Goal Re-Evaluation   Exercise Goals Review Increase Physical Activity;Increase Strenth and Stamina Increase Physical Activity;Increase Strenth and Stamina Increase Physical Activity;Increase Strenth and Stamina Increase Physical Activity;Increase Strenth and Stamina    Comments Patient has completed 15 sessions. She says she feels stronger. She has not been progressed d/t SOB with exerction. Her O2 Saturation remains above 90 during exercise. Will continue to monitor.  Patient has completed 21 sessions. She is progressing well in the program with increased strength and stamina. She is now able to stand up during the warm up. Will continue to monitor.  Patient has completed 28 sessions. She has progressed well with increased strength and stamina. She says she feels stronger and is doing more. 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 Will continue to monitor.  Patient graduated with 36 sessions. She progressed well in the program with reported increased strength and endurance and says she is doing more around the house.     Expected Outcomes Patient will complete the program with increased strength, stamina, and activity. Patient will complete the program with continued increased strength, stamina, and activity.  Patient will complete the program with continued increased strength, stamina, and activity. Patient will continue exercising with continued increased strength, stamina, and activity.        Discharge Exercise Prescription (Final Exercise Prescription Changes):     Exercise Prescription  Changes - 07/19/17 0800      Response to Exercise   Blood Pressure (Admit) 110/68  93 O2   Blood Pressure (Exercise) 134/72  91 O2   Blood Pressure (Exit) 110/64  92 O2   Heart Rate (Admit) 48 bpm   Heart Rate (Exercise) 80 bpm   Heart Rate (Exit) 57 bpm   Rating of Perceived Exertion (Exercise) 13   Duration Progress to 30 minutes of  aerobic without signs/symptoms of physical distress   Intensity THRR unchanged     Progression   Progression Continue to progress workloads to maintain intensity without signs/symptoms of physical distress.     Resistance  Training   Training Prescription Yes   Weight 1   Reps 10-15     NuStep   Level 2   SPM 126   Minutes 15   METs 4.2     Arm Ergometer   Level 3   Watts 7   RPM 14   Minutes 20   METs 3     Home Exercise Plan   Plans to continue exercise at Home (comment)   Frequency Add 2 additional days to program exercise sessions.      Nutrition:  Target Goals: Understanding of nutrition guidelines, daily intake of sodium <1521m, cholesterol <2017m calories 30% from fat and 7% or less from saturated fats, daily to have 5 or more servings of fruits and vegetables.  Biometrics:     Pre Biometrics - 04/14/17 1441      Pre Biometrics   Height 5' 5.5" (1.664 m)   Waist Circumference 47 inches   Hip Circumference 51 inches   Waist to Hip Ratio 0.92 %   BMI (Calculated) 42.4   Triceps Skinfold 22 mm   % Body Fat 49.2 %   Grip Strength 46.33 kg   Flexibility 0 in   Single Leg Stand 2 seconds         Post Biometrics - 07/29/17 1401       Post  Biometrics   Height 5' 5.5" (1.664 m)   Weight 257 lb 4.4 oz (116.7 kg)   Waist Circumference 48 inches   Hip Circumference 51 inches   Waist to Hip Ratio 0.94 %   BMI (Calculated) 42.15   Triceps Skinfold 21 mm   % Body Fat 49.3 %   Grip Strength 42.8 kg   Flexibility 0 in   Single Leg Stand 4 seconds      Nutrition Therapy Plan and Nutrition Goals:   Nutrition  Discharge: Rate Your Plate Scores:     Nutrition Assessments - 07/29/17 1457      MEDFICTS Scores   Pre Score 6   Post Score 64   Score Difference 58      Nutrition Goals Re-Evaluation:   Nutrition Goals Discharge (Final Nutrition Goals Re-Evaluation):   Psychosocial: Target Goals: Acknowledge presence or absence of significant depression and/or stress, maximize coping skills, provide positive support system. Participant is able to verbalize types and ability to use techniques and skills needed for reducing stress and depression.  Initial Review & Psychosocial Screening:     Initial Psych Review & Screening - 04/14/17 1510      Initial Review   Current issues with Current Depression     Family Dynamics   Good Support System? Yes     Barriers   Psychosocial barriers to participate in program The patient should benefit from training in stress management and relaxation.     Screening Interventions   Interventions Encouraged to exercise  Patient feels down at times related to her health. She feels is she could lose some weight and have more energy her psychosocial issues would be resolved.       Quality of Life Scores:     Quality of Life - 07/29/17 1402      Quality of Life Scores   Health/Function Pre 4.8 %   Health/Function Post 20.57 %   Health/Function % Change 328.54 %   Socioeconomic Pre 12 %   Socioeconomic Post 24 %   Socioeconomic % Change  100 %   Psych/Spiritual Pre 10.29 %   Psych/Spiritual Post 19.71 %  Psych/Spiritual % Change 91.55 %   Family Pre 12 %   Family Post 25.5 %   Family % Change 112.5 %   GLOBAL Pre 8.57 %   GLOBAL Post 21.52 %   GLOBAL % Change 151.11 %      PHQ-9: Recent Review Flowsheet Data    Depression screen [facility] 2/9 07/29/2017 04/14/2017 02/17/2017   Decreased Interest 2 1 0   Down, Depressed, Hopeless 1 0 0   PHQ - 2 Score 3 1 0   Altered sleeping 2 1 -   Tired, decreased energy 1 3 -   Change in appetite 3 1 -    Feeling bad or failure about yourself  0 0 -   Trouble concentrating 0 1 -   Moving slowly or fidgety/restless 0 1 -   Suicidal thoughts 0 0 -   PHQ-9 Score 9 8 -   Difficult doing work/chores Not difficult at all Very difficult -     Interpretation of Total Score  Total Score Depression Severity:  1-4 = Minimal depression, 5-9 = Mild depression, 10-14 = Moderate depression, 15-19 = Moderately severe depression, 20-27 = Severe depression   Psychosocial Evaluation and Intervention:     Psychosocial Evaluation - 07/29/17 1504      Discharge Psychosocial Assessment & Intervention   Comments Patient has no psychosocial issues identified at discharge. Her QOL score improved by 151.11% at 21.52. Her exit PHQ9 score was 9.      Psychosocial Re-Evaluation:     Psychosocial Re-Evaluation    Row Name 05/25/17 1319 06/13/17 1314 07/06/17 1431         Psychosocial Re-Evaluation   Current issues with Current Depression Current Depression  -     Comments Patient says she feels down at times d/t having little energy.  Patient says she is feeling better emotionally since her SOB has improved.  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      Expected Outcomes Patient will have improved or unchanged QOL and PHQ-9 scores at discharge.  Patient will have improved PHQ-9 and QOL scores at discharge.  Patient will have improved QOL and PHQ-9 scores at discharge.      Interventions Stress management education;Encouraged to attend Cardiac Rehabilitation for the exercise;Relaxation education Encouraged to attend Cardiac Rehabilitation for the exercise;Stress management education;Relaxation education Relaxation education;Stress management education;Encouraged to attend Cardiac Rehabilitation for the exercise     Continue Psychosocial Services  Follow up required by staff Follow up required by staff Follow up required  by staff        Psychosocial Discharge (Final Psychosocial Re-Evaluation):     Psychosocial Re-Evaluation - 07/06/17 1431      Psychosocial Re-Evaluation   Comments Patient says she does not feel depressed 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    Expected Outcomes Patient will have improved QOL and PHQ-9 scores at discharge.    Interventions Relaxation education;Stress management education;Encouraged to attend Cardiac Rehabilitation for the exercise   Continue Psychosocial Services  Follow up required by staff      Vocational Rehabilitation: Provide vocational rehab assistance to qualifying candidates.   Vocational Rehab Evaluation & Intervention:     Vocational Rehab - 04/14/17 1502      Initial Vocational Rehab Evaluation & Intervention   Assessment shows  need for Vocational Rehabilitation No      Education: Education Goals: Education classes will be provided on a weekly basis, covering required topics. Participant will state understanding/return demonstration of topics presented.  Learning Barriers/Preferences:     Learning Barriers/Preferences - 04/14/17 1502      Learning Barriers/Preferences   Learning Barriers None   Learning Preferences Skilled Demonstration;Pictoral;Video      Education Topics: Hypertension, Hypertension Reduction -Define heart disease and high blood pressure. Discus how high blood pressure affects the body and ways to reduce high blood pressure.   CARDIAC REHAB PHASE II EXERCISE from 07/27/2017 in [place]  Date  (P) 07/06/17  Educator  (P) DC  Instruction Review Code  (P) 2- meets goals/outcomes      Exercise and Your Heart -Discuss why it is important to exercise, the FITT principles of exercise, normal and abnormal responses to exercise, and how to exercise safely.   CARDIAC REHAB PHASE II EXERCISE from 07/27/2017 in [place]   Date  (P) 07/13/17  Educator  (P) DC  Instruction Review Code  (P) 2- meets goals/outcomes      Angina -Discuss definition of angina, causes of angina, treatment of angina, and how to decrease risk of having angina.   CARDIAC REHAB PHASE II EXERCISE from 07/27/2017 in [place]  Date  (P) 04/20/17  Educator  (P) DC  Instruction Review Code  (P) 2- meets goals/outcomes      Cardiac Medications -Review what the following cardiac medications are used for, how they affect the body, and side effects that may occur when taking the medications.  Medications include Aspirin, Beta blockers, calcium channel blockers, ACE Inhibitors, angiotensin receptor blockers, diuretics, digoxin, and antihyperlipidemics.   CARDIAC REHAB PHASE II EXERCISE from 07/27/2017 in [place]  Date  (P) 04/27/17  Educator  (P) Dc  Instruction Review Code  (P) 2- meets goals/outcomes      Congestive Heart Failure -Discuss the definition of CHF, how to live with CHF, the signs and symptoms of CHF, and how keep track of weight and sodium intake.   CARDIAC REHAB PHASE II EXERCISE from 07/27/2017 in [place]  Date  (P) 05/04/17  Educator  (P) DC  Instruction Review Code  (P) 2- meets goals/outcomes      Heart Disease and Intimacy -Discus the effect sexual activity has on the heart, how changes occur during intimacy as we age, and safety during sexual activity.   CARDIAC REHAB PHASE II EXERCISE from 07/27/2017 in [place]  Date  (P) 05/11/17  Educator  (P) DJ  Instruction Review Code  (P) 2- meets goals/outcomes      Smoking Cessation / COPD -Discuss different methods to quit smoking, the health benefits of quitting smoking, and the definition of COPD.   CARDIAC REHAB PHASE II EXERCISE from 07/27/2017 in [place]  Date  (P) 05/18/17  Educator  (P) Russella Dar  Instruction Review Code  (P) 2-  meets goals/outcomes      Nutrition I: Fats -Discuss the types of cholesterol, what cholesterol does to the heart, and how cholesterol levels can be controlled.   CARDIAC REHAB PHASE II EXERCISE from 07/27/2017 in [place]  Date  (P) 05/25/17  Educator  (P) DC  Instruction Review Code  (P) 2- meets goals/outcomes      Nutrition II: Labels -Discuss the different components of food labels and how  to read food label   CARDIAC REHAB PHASE II EXERCISE from 07/27/2017 in [place]  Date  (P) 06/01/17  Educator  (P) DC  Instruction Review Code  (P) 2- meets goals/outcomes      Heart Parts and Heart Disease -Discuss the anatomy of the heart, the pathway of blood circulation through the heart, and these are affected by heart disease.   Stress I: Signs and Symptoms -Discuss the causes of stress, how stress may lead to anxiety and depression, and ways to limit stress.   Stress II: Relaxation -Discuss different types of relaxation techniques to limit stress.   CARDIAC REHAB PHASE II EXERCISE from 07/27/2017 in [place]  Date  (P) 06/22/17  Educator  (P) [place]  Instruction Review Code  (P) 2- meets goals/outcomes      Warning Signs of Stroke / TIA -Discuss definition of a stroke, what the signs and symptoms are of a stroke, and how to identify when someone is having stroke.   CARDIAC REHAB PHASE II EXERCISE from 07/27/2017 in [place]  Date  (P) 06/29/17  Educator  (P) [place]  Instruction Review Code  (P) 2- meets goals/outcomes      Knowledge Questionnaire Score:     Knowledge Questionnaire Score - 07/29/17 1456      Knowledge Questionnaire Score   Pre Score 20/24   Post Score 23/24      Core Components/Risk Factors/Patient Goals at Admission:     Personal Goals and Risk Factors at Admission - 04/14/17 1506      Core Components/Risk Factors/Patient Goals on Admission    Weight  Management Obesity;Yes   Intervention Weight Management: Develop a combined nutrition and exercise program designed to reach desired caloric intake, while maintaining appropriate intake of nutrient and fiber, sodium and fats, and appropriate energy expenditure required for the weight goal.   Admit Weight 258 lb (117 kg)   Goal Weight: Short Term 253 lb (114.8 kg)   Goal Weight: Long Term 243 lb (110.2 kg)   Expected Outcomes Short Term: Continue to assess and modify interventions until short term weight is achieved;Long Term: Adherence to nutrition and physical activity/exercise program aimed toward attainment of established weight goal   Improve shortness of breath with ADL's Yes   Intervention Provide education, individualized exercise plan and daily activity instruction to help decrease symptoms of SOB with activities of daily living.   Expected Outcomes Short Term: Achieves a reduction of symptoms when performing activities of daily living.   Develop more efficient breathing techniques such as purse lipped breathing and diaphragmatic breathing; and practicing self-pacing with activity Yes   Intervention Provide education, demonstration and support about specific breathing techniuqes utilized for more efficient breathing. Include techniques such as pursed lipped breathing, diaphragmatic breathing and self-pacing activity.   Expected Outcomes Short Term: Participant will be able to demonstrate and use breathing techniques as needed throughout daily activities.   Heart Failure Yes   Intervention Provide a combined exercise and nutrition program that is supplemented with education, support and counseling about heart failure. Directed toward relieving symptoms such as shortness of breath, decreased exercise tolerance, and extremity edema.   Expected Outcomes Improve functional capacity of life;Short term: Attendance in program 2-3 days a week with increased exercise capacity. Reported lower sodium  intake. Reported increased fruit and vegetable intake. Reports medication compliance.   Personal Goal Other Yes   Personal Goal Lose 50 lbs long term. Eat right  Intervention Patient will attend CR 3 days/week and supplement with exercise at home 2 days/week.   Expected Outcomes Patient will meet her personal goals.      Core Components/Risk Factors/Patient Goals Review:      Goals and Risk Factor Review    Row Name 05/25/17 1316 06/13/17 1310 07/06/17 1427 07/29/17 1457       Core Components/Risk Factors/Patient Goals Review   Personal Goals Review Weight Management/Obesity;Improve shortness of breath with ADL's;Heart Failure  Lose weight; get stronger; eat healthier Weight Management/Obesity;Improve shortness of breath with ADL's;Heart Failure  Get stronger; lose 50 lbs; eat healthy Weight Management/Obesity;Heart Failure;Improve shortness of breath with ADL's  Get stronger; Lose 50 lbs; eat healthy. Weight Management/Obesity;Improve shortness of breath with ADL's  Get stronger; lose 50 lbs. Eat healthier.     Review Patient has completed 15 sessions gaining 2.6 lbs. She is slowly progressing in the program. She says her SOB has improved since she started the program and she feels stronger. Will continue to monitor for progress.  Patient has completed 21 sessions gaining 3 lbs. She is doing well in the program with improved SOB. She says she is able to walk in from the parking lot now with less SOB. She says she feels better overall and is trying to work on her diet. Will continue to monitor.  Patient has completed 28 sessions gaining 2 lbs. She continues to do well in the program with improved SOB. She is able to do warm-up's and weight's standing now. She says she feels better and is breathing better.  Patient graduated with 36 sessions gaining 1 lb. Her medficts score did not improve with increased alot. She says she is overeating and not eating the right foods. She says the program has  helped her feel better and her SOB has improved. Her exit measurment improved in her balance. She plans to join our forever fit maintenance program to continue exercising.     Expected Outcomes Patient will ccomplete the program and begin to work toward her weight loss goal.  Patient will complete the program meeting her personal goals.  Patient will complete the program and start to lose weight with continued improvement in her SOB. Patient will continue to exercise and work toward healthy eating and her weight loss goal.        Core Components/Risk Factors/Patient Goals at Discharge (Final Review):      Goals and Risk Factor Review - 07/29/17 1457      Core Components/Risk Factors/Patient Goals Review   Personal Goals Review Weight Management/Obesity;Improve shortness of breath with ADL's  Get stronger; lose 50 lbs. Eat healthier.    Review Patient graduated with 36 sessions gaining 1 lb. Her medficts score did not improve with increased alot. She says she is overeating and not eating the right foods. She says the program has helped her feel better and her SOB has improved. Her exit measurment improved in her balance. She plans to join our forever fit maintenance program to continue exercising.    Expected Outcomes Patient will continue to exercise and work toward healthy eating and her weight loss goal.       ITP Comments:     ITP Comments    Row Name 04/27/17 1406           ITP Comments Patient new to program completing 5 sessions. She says she already feels better with decreased pain in her knees. Will continue to monitor for progress.  Comments: Patient graduated from [facility] today on 07/29/17 after completing 36 sessions. She achieved LTG of 30 minutes of aerobic exercise at Max Met level of 3.0. All patients vitals are WNL. Patient has met with dietician. Discharge instruction has been reviewed in detail and patient stated an understanding of material  given. Patient plans to join our forever fit maintenance program to continue exercising. Cardiac Rehab staff will make f/u calls at 1 month, 6 months, and 1 year. Patient had no complaints of any abnormal S/S or pain on their exit visit.

## 2017-07-29 NOTE — Progress Notes (Signed)
Daily Session Note  Patient Details  Name: Pamela Soto MRN: [id number] Date of Birth: 10/30/1951 Referring Provider:     CARDIAC REHAB PHASE II ORIENTATION from 04/14/2017 in [name]  Referring Provider  Dr. Branch      Encounter Date: 07/29/2017  Check In:     Session Check In - 07/29/17 1100      Check-In   Location [facility]   Staff Present Debra Johnson, RN, BSN;Luvena Wentling, BS, EP, Exercise Physiologist   Supervising physician immediately available to respond to emergencies See telemetry face sheet for immediately available MD   Medication changes reported     No   Fall or balance concerns reported    No   Warm-up and Cool-down Performed as group-led instruction   Resistance Training Performed Yes   VAD Patient? No     Pain Assessment   Currently in Pain? No/denies   Pain Score 0-No pain   Multiple Pain Sites No      Capillary Blood Glucose: No results found for this or any previous visit (from the past 24 hour(s)).    History  Smoking Status  . Never Smoker  Smokeless Tobacco  . Never Used    Goals Met:  Independence with exercise equipment Exercise tolerated well No report of cardiac concerns or symptoms Strength training completed today  Goals Unmet:  Not Applicable  Comments: Check out 1200   Dr. Suresh Koneswaran is Medical Director for [facility]. 

## 2017-07-29 NOTE — Progress Notes (Signed)
6 MIN NUSTEP TEST  Date: 07/18/2017 Weight: 116.7kg Height: 65.5     REST   6-MIN   POST 2-MIN HR   60   81   61 BP   110/68   134/72   114/66 O2   91   92   94 RPE   6   13   6  RPD   6   11   6   Distance: 2224ft.   Ex METs: 4.2  Comments: 57 Watts/ 126SPM

## 2017-07-29 NOTE — Progress Notes (Signed)
Discharge Summary  Patient Details  Name: Pamela Soto MRN: [id number] Date of Birth: [birth year] Referring Provider:     CARDIAC REHAB PHASE II ORIENTATION from 04/14/2017 in [facility]  Referring Provider  Dr. Harl Bowie       Number of Visits: 36  Reason for Discharge:  Patient reached a stable level of exercise. Patient independent in their exercise.  Smoking History:  History  Smoking Status  . Never Smoker  Smokeless Tobacco  . Never Used    Diagnosis:  Chronic systolic heart failure ([place])  ADL [facility]:   Initial Exercise Prescription:     Initial Exercise Prescription - 04/14/17 1400      Date of Initial Exercise RX and Referring Provider   Date 04/14/17   Referring Provider Dr. Harl Bowie     NuStep   Level 2   SPM 9   Minutes 15   METs 1.7     Arm Ergometer   Level 1.5   Watts 11   RPM 11   Minutes 20   METs 1.8     Prescription Details   Frequency (times per week) 3   Duration Progress to 30 minutes of continuous aerobic without signs/symptoms of physical distress     Intensity   THRR 40-80% of Max Heartrate 106-123-139   Ratings of Perceived Exertion 11-13   Perceived Dyspnea 0-4     Progression   Progression Continue progressive overload as per policy without signs/symptoms or physical distress.     Resistance Training   Training Prescription Yes   Weight 1   Reps 10-15      Discharge Exercise Prescription (Final Exercise Prescription Changes):     Exercise Prescription Changes - 07/19/17 0800      Response to Exercise   Blood Pressure (Admit) 110/68  93 O2   Blood Pressure (Exercise) 134/72  91 O2   Blood Pressure (Exit) 110/64  92 O2   Heart Rate (Admit) 48 bpm   Heart Rate (Exercise) 80 bpm   Heart Rate (Exit) 57 bpm   Rating of Perceived Exertion (Exercise) 13   Duration Progress to 30 minutes of  aerobic without signs/symptoms of physical distress   Intensity THRR unchanged     Progression    Progression Continue to progress workloads to maintain intensity without signs/symptoms of physical distress.     Resistance Training   Training Prescription Yes   Weight 1   Reps 10-15     NuStep   Level 2   SPM 126   Minutes 15   METs 4.2     Arm Ergometer   Level 3   Watts 7   RPM 14   Minutes 20   METs 3     Home Exercise Plan   Plans to continue exercise at Home (comment)   Frequency Add 2 additional days to program exercise sessions.      Functional Capacity:   Psychological, QOL, Others - Outcomes: PHQ 2/9: Depression screen [facility] 2/9 07/29/2017 04/14/2017 02/17/2017  Decreased Interest 2 1 0  Down, Depressed, Hopeless 1 0 0  PHQ - 2 Score 3 1 0  Altered sleeping 2 1 -  Tired, decreased energy 1 3 -  Change in appetite 3 1 -  Feeling bad or failure about yourself  0 0 -  Trouble concentrating 0 1 -  Moving slowly or fidgety/restless 0 1 -  Suicidal thoughts 0 0 -  PHQ-9 Score 9 8 -  Difficult doing work/chores  Not difficult at all Very difficult -    Quality of Life:     Quality of Life - 07/29/17 1402      Quality of Life Scores   Health/Function Pre 4.8 %   Health/Function Post 20.57 %   Health/Function % Change 328.54 %   Socioeconomic Pre 12 %   Socioeconomic Post 24 %   Socioeconomic % Change  100 %   Psych/Spiritual Pre 10.29 %   Psych/Spiritual Post 19.71 %   Psych/Spiritual % Change 91.55 %   Family Pre 12 %   Family Post 25.5 %   Family % Change 112.5 %   GLOBAL Pre 8.57 %   GLOBAL Post 21.52 %   GLOBAL % Change 151.11 %      Personal Goals: Goals established at orientation with interventions provided to work toward goal.     Personal Goals and Risk Factors at Admission - 04/14/17 1506      Core Components/Risk Factors/Patient Goals on Admission    Weight Management Obesity;Yes   Intervention Weight Management: Develop a combined nutrition and exercise program designed to reach desired caloric intake, while maintaining  appropriate intake of nutrient and fiber, sodium and fats, and appropriate energy expenditure required for the weight goal.   Admit Weight 258 lb (117 kg)   Goal Weight: Short Term 253 lb (114.8 kg)   Goal Weight: Long Term 243 lb (110.2 kg)   Expected Outcomes Short Term: Continue to assess and modify interventions until short term weight is achieved;Long Term: Adherence to nutrition and physical activity/exercise program aimed toward attainment of established weight goal   Improve shortness of breath with ADL's Yes   Intervention Provide education, individualized exercise plan and daily activity instruction to help decrease symptoms of SOB with activities of daily living.   Expected Outcomes Short Term: Achieves a reduction of symptoms when performing activities of daily living.   Develop more efficient breathing techniques such as purse lipped breathing and diaphragmatic breathing; and practicing self-pacing with activity Yes   Intervention Provide education, demonstration and support about specific breathing techniuqes utilized for more efficient breathing. Include techniques such as pursed lipped breathing, diaphragmatic breathing and self-pacing activity.   Expected Outcomes Short Term: Participant will be able to demonstrate and use breathing techniques as needed throughout daily activities.   Heart Failure Yes   Intervention Provide a combined exercise and nutrition program that is supplemented with education, support and counseling about heart failure. Directed toward relieving symptoms such as shortness of breath, decreased exercise tolerance, and extremity edema.   Expected Outcomes Improve functional capacity of life;Short term: Attendance in program 2-3 days a week with increased exercise capacity. Reported lower sodium intake. Reported increased fruit and vegetable intake. Reports medication compliance.   Personal Goal Other Yes   Personal Goal Lose 50 lbs long term. Eat right    Intervention Patient will attend CR 3 days/week and supplement with exercise at home 2 days/week.   Expected Outcomes Patient will meet her personal goals.       Personal Goals Discharge:     Goals and Risk Factor Review    Row Name 05/25/17 1316 06/13/17 1310 07/06/17 1427 07/29/17 1457       Core Components/Risk Factors/Patient Goals Review   Personal Goals Review Weight Management/Obesity;Improve shortness of breath with ADL's;Heart Failure  Lose weight; get stronger; eat healthier Weight Management/Obesity;Improve shortness of breath with ADL's;Heart Failure  Get stronger; lose 50 lbs; eat healthy Weight Management/Obesity;Heart Failure;Improve shortness of  breath with ADL's  Get stronger; Lose 50 lbs; eat healthy. Weight Management/Obesity;Improve shortness of breath with ADL's  Get stronger; lose 50 lbs. Eat healthier.     Review Patient has completed 15 sessions gaining 2.6 lbs. She is slowly progressing in the program. She says her SOB has improved since she started the program and she feels stronger. Will continue to monitor for progress.  Patient has completed 21 sessions gaining 3 lbs. She is doing well in the program with improved SOB. She says she is able to walk in from the parking lot now with less SOB. She says she feels better overall and is trying to work on her diet. Will continue to monitor.  Patient has completed 28 sessions gaining 2 lbs. She continues to do well in the program with improved SOB. She is able to do warm-up's and weight's standing now. She says she feels better and is breathing better.  Patient graduated with 36 sessions gaining 1 lb. Her medficts score did not improve with increased alot. She says she is overeating and not eating the right foods. She says the program has helped her feel better and her SOB has improved. Her exit measurment improved in her balance. She plans to join our forever fit maintenance program to continue exercising.     Expected  Outcomes Patient will ccomplete the program and begin to work toward her weight loss goal.  Patient will complete the program meeting her personal goals.  Patient will complete the program and start to lose weight with continued improvement in her SOB. Patient will continue to exercise and work toward healthy eating and her weight loss goal.        Nutrition & Weight - Outcomes:     Pre Biometrics - 04/14/17 1441      Pre Biometrics   Height 5' 5.5" (1.664 m)   Waist Circumference 47 inches   Hip Circumference 51 inches   Waist to Hip Ratio 0.92 %   BMI (Calculated) 42.4   Triceps Skinfold 22 mm   % Body Fat 49.2 %   Grip Strength 46.33 kg   Flexibility 0 in   Single Leg Stand 2 seconds         Post Biometrics - 07/29/17 1401       Post  Biometrics   Height 5' 5.5" (1.664 m)   Weight 257 lb 4.4 oz (116.7 kg)   Waist Circumference 48 inches   Hip Circumference 51 inches   Waist to Hip Ratio 0.94 %   BMI (Calculated) 42.15   Triceps Skinfold 21 mm   % Body Fat 49.3 %   Grip Strength 42.8 kg   Flexibility 0 in   Single Leg Stand 4 seconds      Nutrition:   Nutrition Discharge:     Nutrition Assessments - 07/29/17 1457      MEDFICTS Scores   Pre Score 6   Post Score 64   Score Difference 58      Education Questionnaire Score:     Knowledge Questionnaire Score - 07/29/17 1456      Knowledge Questionnaire Score   Pre Score 20/24   Post Score 23/24      Goals reviewed with patient; copy given to patient.

## 2017-08-01 NOTE — Progress Notes (Signed)
Cardiac Individual Treatment Plan  Patient Details  Name: Pamela Soto MRN: [id number] Date of Birth: May 29, 1951 Referring Provider:     CARDIAC REHAB PHASE II ORIENTATION from 04/14/2017 in [facility]  Referring Provider  Dr. Harl Bowie      Initial Encounter Date:    CARDIAC REHAB PHASE II ORIENTATION from 04/14/2017 in [facility]  Date  04/14/17  Referring Provider  Dr. Harl Bowie      Visit Diagnosis: Chronic systolic heart failure ([place])  Patient's Home Medications on Admission:  Current Outpatient Prescriptions:  .  acetaminophen (TYLENOL) 500 MG tablet, Take 1 tablet (500 mg total) by mouth every 6 (six) hours as needed., Disp: 30 tablet, Rfl: 0 .  albuterol (PROVENTIL) (2.5 MG/3ML) 0.083% nebulizer solution, Take 3 mLs (2.5 mg total) by nebulization every 6 (six) hours as needed for wheezing or shortness of breath., Disp: 75 mL, Rfl: 12 .  apixaban (ELIQUIS) 5 MG TABS tablet, Take 1 tablet (5 mg total) by mouth 2 (two) times daily., Disp: 60 tablet, Rfl: 3 .  atorvastatin (LIPITOR) 40 MG tablet, Take 1 tablet (40 mg total) by mouth daily., Disp: 90 tablet, Rfl: 3 .  bisoprolol (ZEBETA) 5 MG tablet, Take 1 tablet (5 mg total) by mouth daily., Disp: 90 tablet, Rfl: 1 .  clopidogrel (PLAVIX) 75 MG tablet, Take 1 tablet (75 mg total) by mouth daily., Disp: 90 tablet, Rfl: 1 .  Coenzyme Q10 (CO Q-10) 50 MG CAPS, Take 1 capsule by mouth 2 (two) times daily after a meal., Disp: 60 each, Rfl: 0 .  furosemide (LASIX) 40 MG tablet, Take 1 tablet (40 mg total) by mouth daily., Disp: 90 tablet, Rfl: 1 .  lisinopril (PRINIVIL,ZESTRIL) 5 MG tablet, Take 1 tablet (5 mg total) by mouth daily., Disp: 90 tablet, Rfl: 1 .  potassium chloride SA (K-DUR,KLOR-CON) 20 MEQ tablet, Take 1 tablet (20 mEq total) by mouth daily., Disp: 90 tablet, Rfl: 1 .  tiotropium (SPIRIVA HANDIHALER) 18 MCG inhalation capsule, Place 1 capsule (18 mcg total) into inhaler and inhale  daily., Disp: 30 capsule, Rfl: 12  Past Medical History: Past Medical History:  Diagnosis Date  . Acid reflux   . Hypertension     Tobacco Use: History  Smoking Status  . Never Smoker  Smokeless Tobacco  . Never Used    Labs: Recent Review Flowsheet Data    Labs for ITP Cardiac and Pulmonary Rehab Latest Ref Rng & Units 01/08/2017 01/10/2017 01/10/2017 02/17/2017 07/13/2017   Cholestrol 0 - 200 mg/dL 210(H) - - - 131   LDLCALC 0 - 99 mg/dL 129(H) - - - 61   HDL >40 mg/dL 57 - - - 48   Trlycerides <150 mg/dL 118 - - - 108   Hemoglobin A1c 4.8 - 5.6 % - - - 6.7(H) 6.6(H)   PHART 7.350 - 7.450 - 7.349(L) - - -   PCO2ART 32.0 - 48.0 mmHg - 68.5(HH) - - -   HCO3 20.0 - 28.0 mmol/L - 37.8(H) 39.3(H) - -   TCO2 0 - 100 mmol/L - 40 42 - -   O2SAT % - 94.0 56.0 - -      Capillary Blood Glucose: Lab Results  Component Value Date   GLUCAP 99 11/05/2012     Exercise Target Goals:    Exercise Program Goal: Individual exercise prescription set with THRR, safety & activity barriers. Participant demonstrates ability to understand and report RPE using BORG scale, to self-measure pulse accurately,  and to acknowledge the importance of the exercise prescription.  Exercise Prescription Goal: Starting with aerobic activity 30 plus minutes a day, 3 days per week for initial exercise prescription. Provide home exercise prescription and guidelines that participant acknowledges understanding prior to discharge.  Activity Barriers & Risk Stratification:     Activity Barriers & Cardiac Risk Stratification - 04/14/17 1503      Activity Barriers & Cardiac Risk Stratification   Activity Barriers Shortness of Breath   Cardiac Risk Stratification High      6 Minute Walk:  6 MIN NUSTEP TEST  Date: 07/18/2017 Weight: 116.7kg Height: 65.5                                      REST                           6-MIN                          POST 2-MIN HR                               60                                 81                                61 BP                               110/68                         134/72                         114/66 O2                               91                                92                                94 RPE                             6                                  13                                6  RPD                             6  11                                6  Distance: 2248f.   Ex METs: 4.2  Comments: 57 Watts/ 126SPM  Oxygen Initial Assessment:   Oxygen Re-Evaluation:   Oxygen Discharge (Final Oxygen Re-Evaluation):   Initial Exercise Prescription:     Initial Exercise Prescription - 04/14/17 1400      Date of Initial Exercise RX and Referring Provider   Date 04/14/17   Referring Provider Dr. BHarl Bowie    NuStep   Level 2   SPM 9   Minutes 15   METs 1.7     Arm Ergometer   Level 1.5   Watts 11   RPM 11   Minutes 20   METs 1.8     Prescription Details   Frequency (times per week) 3   Duration Progress to 30 minutes of continuous aerobic without signs/symptoms of physical distress     Intensity   THRR 40-80% of Max Heartrate 106-123-139   Ratings of Perceived Exertion 11-13   Perceived Dyspnea 0-4     Progression   Progression Continue progressive overload as per policy without signs/symptoms or physical distress.     Resistance Training   Training Prescription Yes   Weight 1   Reps 10-15      Perform Capillary Blood Glucose checks as needed.  Exercise Prescription Changes:      Exercise Prescription Changes    Row Name 04/25/17 1400 05/09/17 1400 05/19/17 1500 06/06/17 1400 06/29/17 1400     Response to Exercise   Blood Pressure (Admit) 130/72 132/60  95 O2 100/60  93 O2 128/66  90 O2 116/66  93 O2   Blood Pressure (Exercise) 154/76 110/58  91 O2 128/64  93 O2 122/72  95 O2 124/68  93 O2   Blood Pressure (Exit) 130/72 120/58   91 O2 120/50  94 O2 122/70  90 O2 114/68  93 O2   Heart Rate (Admit) 79 bpm 78 bpm 77 bpm 72 bpm 72 bpm   Heart Rate (Exercise) 94 bpm 84 bpm 97 bpm 88 bpm 91 bpm   Heart Rate (Exit) 83 bpm 87 bpm 84 bpm 77 bpm 65 bpm   Rating of Perceived Exertion (Exercise) 12 10 10 10 10    Duration  - Progress to 30 minutes of  aerobic without signs/symptoms of physical distress Progress to 30 minutes of  aerobic without signs/symptoms of physical distress Progress to 30 minutes of  aerobic without signs/symptoms of physical distress Progress to 30 minutes of  aerobic without signs/symptoms of physical distress   Intensity  - THRR unchanged THRR unchanged THRR unchanged THRR unchanged     Progression   Progression  - Continue to progress workloads to maintain intensity without signs/symptoms of physical distress. Continue to progress workloads to maintain intensity without signs/symptoms of physical distress. Continue to progress workloads to maintain intensity without signs/symptoms of physical distress. Continue to progress workloads to maintain intensity without signs/symptoms of physical distress.     Resistance Training   Training Prescription Yes Yes Yes Yes Yes   Weight 1 1 1 1 1    Reps 10-15 10-15 10-15 10-15 10-15     NuStep   Level 1 1 1 2 2    SPM 12 15 12 20 22    Minutes 15 15 15 15 15    METs 3.74 3.73 3.73 3.74 2  Arm Ergometer   Level 1.7 2 2  2.3 2.4   Watts 14 10 14 5 7    RPM 14 10 14 14 14    Minutes 20 20 20 20 20    METs 2.1 1.8 2.1 2.3 2.4     Home Exercise Plan   Plans to continue exercise at Home (comment) Home (comment) Home (comment) Home (comment) Home (comment)   Frequency Add 2 additional days to program exercise sessions. Add 2 additional days to program exercise sessions. Add 2 additional days to program exercise sessions. Add 2 additional days to program exercise sessions. Add 2 additional days to program exercise sessions.   [place] Name 07/19/17 0800              Response to Exercise   Blood Pressure (Admit) 110/68  93 O2       Blood Pressure (Exercise) 134/72  91 O2       Blood Pressure (Exit) 110/64  92 O2       Heart Rate (Admit) 48 bpm       Heart Rate (Exercise) 80 bpm       Heart Rate (Exit) 57 bpm       Rating of Perceived Exertion (Exercise) 13       Duration Progress to 30 minutes of  aerobic without signs/symptoms of physical distress       Intensity THRR unchanged         Progression   Progression Continue to progress workloads to maintain intensity without signs/symptoms of physical distress.         Resistance Training   Training Prescription Yes       Weight 1       Reps 10-15         NuStep   Level 2       SPM 126       Minutes 15       METs 4.2         Arm Ergometer   Level 3       Watts 7       RPM 14       Minutes 20       METs 3         Home Exercise Plan   Plans to continue exercise at Home (comment)       Frequency Add 2 additional days to program exercise sessions.          Exercise Comments:      Exercise Comments    Row Name 04/25/17 1452 05/09/17 1424 05/19/17 1527 06/06/17 1500 06/29/17 1423   Exercise Comments Patient is doing well in CR and is being closely monitored.  Patient is doing alright in CR. Her O2 is being monitored and her doctor has recently scheduled an appointment for her.  Patient is maintaining her levels but however has not been progressed due to O2 issues when exerting energy.  Patient is doing very well in CR.  Patient is doing well in CR.    [place] Name 07/19/17 [phone]           Exercise Comments Patient is doing well in CR.           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       Intervention Provide advice, education, support and counseling about physical activity/exercise  needs.;Develop an individualized exercise prescription for aerobic and resistive training based on initial evaluation findings, risk stratification, comorbidities and participant's personal goals.       Expected Outcomes Achievement of increased cardiorespiratory fitness and enhanced flexibility, muscular endurance and strength shown through measurements of functional capacity and personal statement of participant.       Increase Strength and Stamina Yes       Intervention Provide advice, education, support and counseling about physical activity/exercise needs.;Develop an individualized exercise prescription for aerobic and resistive training based on initial evaluation findings, risk stratification, comorbidities and participant's personal goals.       Expected Outcomes Achievement of increased cardiorespiratory fitness and enhanced flexibility, muscular endurance and strength shown through measurements of functional capacity and personal statement of participant.          Exercise Goals Re-Evaluation :     Exercise Goals Re-Evaluation    Row Name 05/25/17 1320 06/13/17 1312 07/06/17 1429 07/29/17 1502       Exercise Goal Re-Evaluation   Exercise Goals Review Increase Physical Activity;Increase Strenth and Stamina Increase Physical Activity;Increase Strenth and Stamina Increase Physical Activity;Increase Strenth and Stamina Increase Physical Activity;Increase Strenth and Stamina    Comments Patient has completed 15 sessions. She says she feels stronger. She has not been progressed d/t SOB with exerction. Her O2 Saturation remains above 90 during exercise. Will continue to monitor.  Patient has completed 21 sessions. She is progressing well in the program with increased strength and stamina. She is now able to stand up during the warm up. Will continue to monitor.  Patient has completed 28 sessions. She has progressed well with increased strength and stamina. She says she feels stronger and is doing  more. 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 Will continue to monitor.  Patient graduated with 36 sessions. She progressed well in the program with reported increased strength and endurance and says she is doing more around the house.     Expected Outcomes Patient will complete the program with increased strength, stamina, and activity. Patient will complete the program with continued increased strength, stamina, and activity.  Patient will complete the program with continued increased strength, stamina, and activity. Patient will continue exercising with continued increased strength, stamina, and activity.        Discharge Exercise Prescription (Final Exercise Prescription Changes):     Exercise Prescription Changes - 07/19/17 0800      Response to Exercise   Blood Pressure (Admit) 110/68  93 O2   Blood Pressure (Exercise) 134/72  91 O2   Blood Pressure (Exit) 110/64  92 O2   Heart Rate (Admit) 48 bpm   Heart Rate (Exercise) 80 bpm   Heart Rate (Exit) 57 bpm   Rating of Perceived Exertion (Exercise) 13   Duration Progress to 30 minutes of  aerobic without signs/symptoms of physical distress   Intensity THRR unchanged     Progression   Progression Continue to progress workloads to maintain intensity without signs/symptoms of physical distress.     Resistance Training   Training Prescription Yes   Weight 1   Reps 10-15     NuStep   Level 2   SPM 126   Minutes 15   METs 4.2     Arm Ergometer   Level 3   Watts 7  RPM 14   Minutes 20   METs 3     Home Exercise Plan   Plans to continue exercise at Home (comment)   Frequency Add 2 additional days to program exercise sessions.      Nutrition:  Target Goals: Understanding of nutrition guidelines, daily intake of sodium <1524m, cholesterol <2048m calories 30% from fat and 7% or less from saturated fats, daily to have 5 or more servings of fruits and vegetables.  Biometrics:     Pre  Biometrics - 04/14/17 1441      Pre Biometrics   Height 5' 5.5" (1.664 m)   Waist Circumference 47 inches   Hip Circumference 51 inches   Waist to Hip Ratio 0.92 %   BMI (Calculated) 42.4   Triceps Skinfold 22 mm   % Body Fat 49.2 %   Grip Strength 46.33 kg   Flexibility 0 in   Single Leg Stand 2 seconds         Post Biometrics - 07/29/17 1401       Post  Biometrics   Height 5' 5.5" (1.664 m)   Weight 257 lb 4.4 oz (116.7 kg)   Waist Circumference 48 inches   Hip Circumference 51 inches   Waist to Hip Ratio 0.94 %   BMI (Calculated) 42.15   Triceps Skinfold 21 mm   % Body Fat 49.3 %   Grip Strength 42.8 kg   Flexibility 0 in   Single Leg Stand 4 seconds      Nutrition Therapy Plan and Nutrition Goals:   Nutrition Discharge: Rate Your Plate Scores:     Nutrition Assessments - 07/29/17 1457      MEDFICTS Scores   Pre Score 6   Post Score 64   Score Difference 58      Nutrition Goals Re-Evaluation:   Nutrition Goals Discharge (Final Nutrition Goals Re-Evaluation):   Psychosocial: Target Goals: Acknowledge presence or absence of significant depression and/or stress, maximize coping skills, provide positive support system. Participant is able to verbalize types and ability to use techniques and skills needed for reducing stress and depression.  Initial Review & Psychosocial Screening:     Initial Psych Review & Screening - 04/14/17 1510      Initial Review   Current issues with Current Depression     Family Dynamics   Good Support System? Yes     Barriers   Psychosocial barriers to participate in program The patient should benefit from training in stress management and relaxation.     Screening Interventions   Interventions Encouraged to exercise  Patient feels down at times related to her health. She feels is she could lose some weight and have more energy her psychosocial issues would be resolved.       Quality of Life Scores:     Quality  of Life - 07/29/17 1402      Quality of Life Scores   Health/Function Pre 4.8 %   Health/Function Post 20.57 %   Health/Function % Change 328.54 %   Socioeconomic Pre 12 %   Socioeconomic Post 24 %   Socioeconomic % Change  100 %   Psych/Spiritual Pre 10.29 %   Psych/Spiritual Post 19.71 %   Psych/Spiritual % Change 91.55 %   Family Pre 12 %   Family Post 25.5 %   Family % Change 112.5 %   GLOBAL Pre 8.57 %   GLOBAL Post 21.52 %   GLOBAL % Change 151.11 %  PHQ-9: Recent Review Flowsheet Data    Depression screen [facility] 2/9 07/29/2017 04/14/2017 02/17/2017   Decreased Interest 2 1 0   Down, Depressed, Hopeless 1 0 0   PHQ - 2 Score 3 1 0   Altered sleeping 2 1 -   Tired, decreased energy 1 3 -   Change in appetite 3 1 -   Feeling bad or failure about yourself  0 0 -   Trouble concentrating 0 1 -   Moving slowly or fidgety/restless 0 1 -   Suicidal thoughts 0 0 -   PHQ-9 Score 9 8 -   Difficult doing work/chores Not difficult at all Very difficult -     Interpretation of Total Score  Total Score Depression Severity:  1-4 = Minimal depression, 5-9 = Mild depression, 10-14 = Moderate depression, 15-19 = Moderately severe depression, 20-27 = Severe depression   Psychosocial Evaluation and Intervention:     Psychosocial Evaluation - 07/29/17 1504      Discharge Psychosocial Assessment & Intervention   Comments Patient has no psychosocial issues identified at discharge. Her QOL score improved by 151.11% at 21.52. Her exit PHQ9 score was 9.      Psychosocial Re-Evaluation:     Psychosocial Re-Evaluation    Row Name 05/25/17 1319 06/13/17 1314 07/06/17 1431         Psychosocial Re-Evaluation   Current issues with Current Depression Current Depression  -     Comments Patient says she feels down at times d/t having little energy.  Patient says she is feeling better emotionally since her SOB has improved.  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      Expected Outcomes Patient will have improved or unchanged QOL and PHQ-9 scores at discharge.  Patient will have improved PHQ-9 and QOL scores at discharge.  Patient will have improved QOL and PHQ-9 scores at discharge.      Interventions Stress management education;Encouraged to attend Cardiac Rehabilitation for the exercise;Relaxation education Encouraged to attend Cardiac Rehabilitation for the exercise;Stress management education;Relaxation education Relaxation education;Stress management education;Encouraged to attend Cardiac Rehabilitation for the exercise     Continue Psychosocial Services  Follow up required by staff Follow up required by staff Follow up required by staff        Psychosocial Discharge (Final Psychosocial Re-Evaluation):     Psychosocial Re-Evaluation - 07/06/17 1431      Psychosocial Re-Evaluation   Comments Patient says she does not feel depressed 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    Expected Outcomes Patient will have improved QOL and PHQ-9 scores at discharge.    Interventions Relaxation education;Stress management education;Encouraged to attend Cardiac Rehabilitation for the exercise   Continue Psychosocial Services  Follow up required by staff      Vocational Rehabilitation: Provide vocational rehab assistance to qualifying candidates.   Vocational Rehab Evaluation & Intervention:     Vocational Rehab - 04/14/17 1502      Initial Vocational Rehab Evaluation & Intervention   Assessment shows need for Vocational Rehabilitation No      Education: Education Goals: Education classes will be provided on a weekly basis, covering required topics. Participant will state understanding/return demonstration of topics presented.  Learning Barriers/Preferences:     Learning Barriers/Preferences - 04/14/17 1502  Learning  Barriers/Preferences   Learning Barriers None   Learning Preferences Skilled Demonstration;Pictoral;Video      Education Topics: Hypertension, Hypertension Reduction -Define heart disease and high blood pressure. Discus how high blood pressure affects the body and ways to reduce high blood pressure.   CARDIAC REHAB PHASE II EXERCISE from 07/27/2017 in [place]  Date  (P) 07/06/17  Educator  (P) DC  Instruction Review Code (retired)  (P) 2- meets goals/outcomes      Exercise and Your Heart -Discuss why it is important to exercise, the FITT principles of exercise, normal and abnormal responses to exercise, and how to exercise safely.   CARDIAC REHAB PHASE II EXERCISE from 07/27/2017 in [place]  Date  (P) 07/13/17  Educator  (P) DC  Instruction Review Code (retired)  (P) 2- meets goals/outcomes      Angina -Discuss definition of angina, causes of angina, treatment of angina, and how to decrease risk of having angina.   CARDIAC REHAB PHASE II EXERCISE from 07/27/2017 in [place]  Date  (P) 04/20/17  Educator  (P) DC  Instruction Review Code  (P) 2- meets goals/outcomes      Cardiac Medications -Review what the following cardiac medications are used for, how they affect the body, and side effects that may occur when taking the medications.  Medications include Aspirin, Beta blockers, calcium channel blockers, ACE Inhibitors, angiotensin receptor blockers, diuretics, digoxin, and antihyperlipidemics.   CARDIAC REHAB PHASE II EXERCISE from 07/27/2017 in [place]  Date  (P) 04/27/17  Educator  (P) Dc  Instruction Review Code  (P) 2- meets goals/outcomes      Congestive Heart Failure -Discuss the definition of CHF, how to live with CHF, the signs and symptoms of CHF, and how keep track of weight and sodium intake.   CARDIAC REHAB PHASE II EXERCISE from 07/27/2017 in [place]  Date  (P) 05/04/17  Educator  (P) DC  Instruction Review Code  (P) 2- meets goals/outcomes      Heart Disease and Intimacy -Discus the effect sexual activity has on the heart, how changes occur during intimacy as we age, and safety during sexual activity.   CARDIAC REHAB PHASE II EXERCISE from 07/27/2017 in [place]  Date  (P) 05/11/17  Educator  (P) DJ  Instruction Review Code (retired)  (P) 2- meets goals/outcomes      Smoking Cessation / COPD -Discuss different methods to quit smoking, the health benefits of quitting smoking, and the definition of COPD.   CARDIAC REHAB PHASE II EXERCISE from 07/27/2017 in [place]  Date  (P) 05/18/17  Educator  (P) [place]  Instruction Review Code (retired)  (P) 2- meets goals/outcomes      Nutrition I: Fats -Discuss the types of cholesterol, what cholesterol does to the heart, and how cholesterol levels can be controlled.   CARDIAC REHAB PHASE II EXERCISE from 07/27/2017 in [place]  Date  (P) 05/25/17  Educator  (P) DC  Instruction Review Code  (P) 2- meets goals/outcomes      Nutrition II: Labels -Discuss the different components of food labels and how to read food label   CARDIAC REHAB PHASE II EXERCISE from 07/27/2017 in [place]  Date  (P) 06/01/17  Educator  (P) DC  Instruction Review Code  (P) 2- meets goals/outcomes      Heart Parts and Heart  Disease -Discuss the anatomy of the heart, the pathway of blood circulation through the heart, and these are affected by heart disease.   Stress I: Signs and Symptoms -Discuss the causes of stress, how stress may lead to anxiety and depression, and ways to limit stress.   Stress II: Relaxation -Discuss different types of relaxation techniques to limit stress.   CARDIAC REHAB PHASE II EXERCISE from 07/27/2017 in [place]  Date  (P) 06/22/17   Educator  (P) [place]  Instruction Review Code  (P) 2- meets goals/outcomes      Warning Signs of Stroke / TIA -Discuss definition of a stroke, what the signs and symptoms are of a stroke, and how to identify when someone is having stroke.   CARDIAC REHAB PHASE II EXERCISE from 07/27/2017 in [place]  Date  (P) 06/29/17  Educator  (P) [place]  Instruction Review Code  (P) 2- meets goals/outcomes      Knowledge Questionnaire Score:     Knowledge Questionnaire Score - 07/29/17 1456      Knowledge Questionnaire Score   Pre Score 20/24   Post Score 23/24      Core Components/Risk Factors/Patient Goals at Admission:     Personal Goals and Risk Factors at Admission - 04/14/17 1506      Core Components/Risk Factors/Patient Goals on Admission    Weight Management Obesity;Yes   Intervention Weight Management: Develop a combined nutrition and exercise program designed to reach desired caloric intake, while maintaining appropriate intake of nutrient and fiber, sodium and fats, and appropriate energy expenditure required for the weight goal.   Admit Weight 258 lb (117 kg)   Goal Weight: Short Term 253 lb (114.8 kg)   Goal Weight: Long Term 243 lb (110.2 kg)   Expected Outcomes Short Term: Continue to assess and modify interventions until short term weight is achieved;Long Term: Adherence to nutrition and physical activity/exercise program aimed toward attainment of established weight goal   Improve shortness of breath with ADL's Yes   Intervention Provide education, individualized exercise plan and daily activity instruction to help decrease symptoms of SOB with activities of daily living.   Expected Outcomes Short Term: Achieves a reduction of symptoms when performing activities of daily living.   Develop more efficient breathing techniques such as purse lipped breathing and diaphragmatic breathing; and practicing self-pacing with activity Yes   Intervention Provide  education, demonstration and support about specific breathing techniuqes utilized for more efficient breathing. Include techniques such as pursed lipped breathing, diaphragmatic breathing and self-pacing activity.   Expected Outcomes Short Term: Participant will be able to demonstrate and use breathing techniques as needed throughout daily activities.   Heart Failure Yes   Intervention Provide a combined exercise and nutrition program that is supplemented with education, support and counseling about heart failure. Directed toward relieving symptoms such as shortness of breath, decreased exercise tolerance, and extremity edema.   Expected Outcomes Improve functional capacity of life;Short term: Attendance in program 2-3 days a week with increased exercise capacity. Reported lower sodium intake. Reported increased fruit and vegetable intake. Reports medication compliance.   Personal Goal Other Yes   Personal Goal Lose 50 lbs long term. Eat right   Intervention Patient will attend CR 3 days/week and supplement with exercise at home 2 days/week.   Expected Outcomes Patient will meet her personal goals.      Core Components/Risk Factors/Patient Goals Review:      Goals and Risk Factor Review  [place] Name 05/25/17 1316 06/13/17 1310 07/06/17 1427 07/29/17 1457       Core Components/Risk Factors/Patient Goals Review   Personal Goals Review Weight Management/Obesity;Improve shortness of breath with ADL's;Heart Failure  Lose weight; get stronger; eat healthier Weight Management/Obesity;Improve shortness of breath with ADL's;Heart Failure  Get stronger; lose 50 lbs; eat healthy Weight Management/Obesity;Heart Failure;Improve shortness of breath with ADL's  Get stronger; Lose 50 lbs; eat healthy. Weight Management/Obesity;Improve shortness of breath with ADL's  Get stronger; lose 50 lbs. Eat healthier.     Review Patient has completed 15 sessions gaining 2.6 lbs. She is slowly progressing in the  program. She says her SOB has improved since she started the program and she feels stronger. Will continue to monitor for progress.  Patient has completed 21 sessions gaining 3 lbs. She is doing well in the program with improved SOB. She says she is able to walk in from the parking lot now with less SOB. She says she feels better overall and is trying to work on her diet. Will continue to monitor.  Patient has completed 28 sessions gaining 2 lbs. She continues to do well in the program with improved SOB. She is able to do warm-up's and weight's standing now. She says she feels better and is breathing better.  Patient graduated with 36 sessions gaining 1 lb. Her medficts score did not improve with increased alot. She says she is overeating and not eating the right foods. She says the program has helped her feel better and her SOB has improved. Her exit measurment improved in her balance. She plans to join our forever fit maintenance program to continue exercising.     Expected Outcomes Patient will ccomplete the program and begin to work toward her weight loss goal.  Patient will complete the program meeting her personal goals.  Patient will complete the program and start to lose weight with continued improvement in her SOB. Patient will continue to exercise and work toward healthy eating and her weight loss goal.        Core Components/Risk Factors/Patient Goals at Discharge (Final Review):      Goals and Risk Factor Review - 07/29/17 1457      Core Components/Risk Factors/Patient Goals Review   Personal Goals Review Weight Management/Obesity;Improve shortness of breath with ADL's  Get stronger; lose 50 lbs. Eat healthier.    Review Patient graduated with 36 sessions gaining 1 lb. Her medficts score did not improve with increased alot. She says she is overeating and not eating the right foods. She says the program has helped her feel better and her SOB has improved. Her exit measurment improved in her  balance. She plans to join our forever fit maintenance program to continue exercising.    Expected Outcomes Patient will continue to exercise and work toward healthy eating and her weight loss goal.       ITP Comments:     ITP Comments    Row Name 04/27/17 1406           ITP Comments Patient new to program completing 5 sessions. She says she already feels better with decreased pain in her knees. Will continue to monitor for progress.           Comments: Patient graduated from [place] today on 07/29/17 after completing 36 sessions. She achieved LTG of 30 minutes of aerobic exercise at Max Met level of 3.0. All patients vitals are WNL. Patient has met with dietician. Discharge instruction has been  reviewed in detail and patient stated an understanding of material given. Patient plans to join our forever fit maintenance program to continue exercising. Cardiac Rehab staff will make f/u calls at 1 month, 6 months, and 1 year. Patient had no complaints of any abnormal S/S or pain on their exit visit.

## 2017-08-18 ENCOUNTER — Ambulatory Visit (INDEPENDENT_AMBULATORY_CARE_PROVIDER_SITE_OTHER): Payer: Medicare Other | Admitting: Cardiology

## 2017-08-18 ENCOUNTER — Encounter: Payer: Self-pay | Admitting: Cardiology

## 2017-08-18 VITALS — BP 114/58 | HR 74 | Ht 65.0 in | Wt 257.4 lb

## 2017-08-18 DIAGNOSIS — E782 Mixed hyperlipidemia: Secondary | ICD-10-CM

## 2017-08-18 DIAGNOSIS — I4891 Unspecified atrial fibrillation: Secondary | ICD-10-CM

## 2017-08-18 DIAGNOSIS — I2510 Atherosclerotic heart disease of native coronary artery without angina pectoris: Secondary | ICD-10-CM

## 2017-08-18 DIAGNOSIS — R197 Diarrhea, unspecified: Secondary | ICD-10-CM | POA: Diagnosis not present

## 2017-08-18 MED ORDER — ATORVASTATIN CALCIUM 20 MG PO TABS: 20.0000 mg | ORAL_TABLET | Freq: Every day | ORAL | 1 refills | Status: DC

## 2017-08-18 NOTE — Progress Notes (Signed)
Clinical Summary Pamela Soto is a 66 y.o.female seen today for follow up of the following medical problems.   1. NICM/Chronic systolic HF/NSTEMI - admit Jan 2018 with CHF - echo at that time showed LVEF 35-40%, a new finding for the patient.  - referred for cath. Feb 2018 cath: distal LAD 100%, Prox RCA 40%, OM3 40%. RHC mean PA 37, PCWP 26, CI 1.73. CAD medically managed.  - repeat echo 07/2017: LVEF 50-55%, abnormal diastolic function  - breathing has been improving over time. No recent LE edema, no orthopnea or PND.    2. COPD - 03/2017 PFTs: severe COPD - followed by Dr Luan Pulling - compliant with inhalers  3. Afib - no recent palpitations - no bleeding on anticoag.   4. Hyperlipidemia - reports nonspecific neck and shoulder pains, unclear if related to statin.  - somewhat improved with lower statin dosing - 07/2017 TC 131 TG 108 HDL 48 LDL 61 - atorvastatin recently increased to 40mg  daily. Since that time has had some stomach upset for few week, constiptation alternating with diarrhea.   5. DM2 - followed by pcp   6. OSA - sleep study 03/2017 with severe OSA. On bipap, followed by Dr Luan Pulling   Past Medical History:  Diagnosis Date  . Acid reflux   . Hypertension      No Known Allergies   Current Outpatient Prescriptions  Medication Sig Dispense Refill  . acetaminophen (TYLENOL) 500 MG tablet Take 1 tablet (500 mg total) by mouth every 6 (six) hours as needed. 30 tablet 0  . albuterol (PROVENTIL) (2.5 MG/3ML) 0.083% nebulizer solution Take 3 mLs (2.5 mg total) by nebulization every 6 (six) hours as needed for wheezing or shortness of breath. 75 mL 12  . apixaban (ELIQUIS) 5 MG TABS tablet Take 1 tablet (5 mg total) by mouth 2 (two) times daily. 60 tablet 3  . atorvastatin (LIPITOR) 40 MG tablet Take 1 tablet (40 mg total) by mouth daily. 90 tablet 3  . bisoprolol (ZEBETA) 5 MG tablet Take 1 tablet (5 mg total) by mouth daily. 90 tablet 1  . clopidogrel  (PLAVIX) 75 MG tablet Take 1 tablet (75 mg total) by mouth daily. 90 tablet 1  . Coenzyme Q10 (CO Q-10) 50 MG CAPS Take 1 capsule by mouth 2 (two) times daily after a meal. 60 each 0  . furosemide (LASIX) 40 MG tablet Take 1 tablet (40 mg total) by mouth daily. 90 tablet 1  . lisinopril (PRINIVIL,ZESTRIL) 5 MG tablet Take 1 tablet (5 mg total) by mouth daily. 90 tablet 1  . potassium chloride SA (K-DUR,KLOR-CON) 20 MEQ tablet Take 1 tablet (20 mEq total) by mouth daily. 90 tablet 1  . tiotropium (SPIRIVA HANDIHALER) 18 MCG inhalation capsule Place 1 capsule (18 mcg total) into inhaler and inhale daily. 30 capsule 12   No current facility-administered medications for this visit.      Past Surgical History:  Procedure Laterality Date  . BREAST BIOPSY Right    normal result  . CESAREAN SECTION    . CHOLECYSTECTOMY  11/06/2012   Procedure: LAPAROSCOPIC CHOLECYSTECTOMY WITH INTRAOPERATIVE CHOLANGIOGRAM;  Surgeon: Pedro Earls, MD;  Location: [facility];  Service: General;  Laterality: N/A;  . RIGHT/LEFT HEART CATH AND CORONARY ANGIOGRAPHY N/A 01/10/2017   Procedure: Right/Left Heart Cath and Coronary Angiography;  Surgeon: Belva Crome, MD;  Location: [place] [facility];  Service: Cardiovascular;  Laterality: N/A;  . UMBILICAL HERNIA REPAIR  11/06/2012  Procedure: HERNIA REPAIR UMBILICAL ADULT;  Surgeon: Pedro Earls, MD;  Location: [facility];  Service: General;;     No Known Allergies    Family History  Problem Relation Age of Onset  . Heart disease Mother 48  . Heart attack Mother   . Asthma Father   . Cancer Father        pancreastic cancer  . Heart attack Brother 72  . Cancer Sister        unsure     Social History Pamela Soto reports that she has never smoked. She has never used smokeless tobacco. Pamela Soto reports that she does not drink alcohol.   Review of Systems CONSTITUTIONAL: No weight loss, fever, chills, weakness or fatigue.  HEENT: Eyes: No visual loss,  blurred vision, double vision or yellow sclerae.No hearing loss, sneezing, congestion, runny nose or sore throat.  SKIN: No rash or itching.  CARDIOVASCULAR: per hpi RESPIRATORY: per hpi GASTROINTESTINAL: No anorexia, nausea, vomiting or diarrhea. No abdominal pain or blood.  GENITOURINARY: No burning on urination, no polyuria NEUROLOGICAL: No headache, dizziness, syncope, paralysis, ataxia, numbness or tingling in the extremities. No change in bowel or bladder control.  MUSCULOSKELETAL: No muscle, back pain, joint pain or stiffness.  LYMPHATICS: No enlarged nodes. No history of splenectomy.  PSYCHIATRIC: No history of depression or anxiety.  ENDOCRINOLOGIC: No reports of sweating, cold or heat intolerance. No polyuria or polydipsia.  [name]   Physical Examination Vitals:   08/18/17 1507  BP: (!) 114/58  Pulse: 74  SpO2: 92%   Vitals:   08/18/17 1507  Weight: 257 lb 6.4 oz (116.8 kg)  Height: 5\' 5"  (1.651 m)    Gen: resting comfortably, no acute distress HEENT: no scleral icterus, pupils equal round and reactive, no palptable cervical adenopathy,  CV: RRR. No mrg, no jvd Resp: Clear to auscultation bilaterally GI: abdomen is soft, non-tender, non-distended, normal bowel sounds, no hepatosplenomegaly MSK: extremities are warm, no edema.  Skin: warm, no rash Neuro:  no focal deficits Psych: appropriate affect   Diagnostic Studies 01/2017 cath  Dist LAD lesion, 100 %stenosed.  Prox RCA lesion, 40 %stenosed.  3rd Mrg lesion, 40 %stenosed.  There is severe left ventricular systolic dysfunction.  The left ventricular ejection fraction is less than 25% by visual estimate.  LV end diastolic pressure is severely elevated.  Hemodynamic findings consistent with moderate pulmonary hypertension.   Acute atrial fibrillation developed on the cath table during right heart/right atrial pressure recording. Rate 118 bpm. Atrial fibrillation was treated with one bolus of IV  amiodarone.  40% proximal RCA stenosis.  Tortuous but normal circumflex  100% distal/apical LAD stenosis. This lesion is probably chronic but difficult to determine age. There is also a possibility that it is acute embolic occlusion from a catheter-based thrombus. For that reason additional heparin and then Aggrastat bolus was given as the patient complained of chest pressure post procedure.  Chest pressure post procedure is similar to prior episodes.  Severe LV dysfunction with EF less than 25%. Contraction pattern appears global.  Arterial blood gas demonstrated severe CO2 retention, pCO2 67.    01/2017 echo Study Conclusions  - Left ventricle: The cavity size was mildly dilated. Wall thickness was increased in a pattern of moderate LVH. Systolic function was moderately reduced. The estimated ejection fraction was in the range of 35% to 40%. Diffuse hypokinesis. - Aortic valve: Mildly calcified annulus. Trileaflet; mildly thickened leaflets. - Mitral valve: There was mild regurgitation. - Left atrium:  The atrium was moderately dilated. - Atrial septum: The septum bowed from left to right, consistent with increased left atrial pressure. - Pulmonic valve: There was mild regurgitation.   07/2017 echo Study Conclusions  - Left ventricle: The cavity size was normal. Wall thickness was   increased increased in a pattern of mild to moderate LVH.   Systolic function was normal. The estimated ejection fraction was   in the range of 50% to 55%. Diastolic function is abnormal,   indeterminant grade. LVOT VTI and Vmax likely underestimated   based on angle of acquisition. - Aortic valve: Valve area (VTI): 1.56 cm^2. Valve area (Vmax):   1.84 cm^2. - Technically difficult study.  Assessment and Plan   1. CAD/Chronic systolic HF/NSTEMI - cardiac function has normalized, doing well from a symptoms standpoint - continue current meds  2. Afib -  CHADS2Vasc score is  5, she will continue eliquis - no recent sypmtoms  3. Hyperlipidemia - not tolerating higher dose statin, we will lower atorva to 20mg  daily.  . 4. Diarrhea - ongoing for a few weeks, we will refer to GI   Arnoldo Lenis, M.D.

## 2017-08-18 NOTE — Patient Instructions (Signed)
Your physician recommends that you schedule a follow-up appointment in: [place] has recommended you make the following change in your medication:   DECREASE ATORVASTATIN 20 MG DAILY  You have been referred to GASTROENTEROLOGY   Thank you for choosing [facility]!!

## 2017-08-23 ENCOUNTER — Encounter: Payer: Self-pay | Admitting: Gastroenterology

## 2017-08-31 ENCOUNTER — Ambulatory Visit (INDEPENDENT_AMBULATORY_CARE_PROVIDER_SITE_OTHER): Payer: Medicare Other | Admitting: Gastroenterology

## 2017-08-31 ENCOUNTER — Encounter: Payer: Self-pay | Admitting: Gastroenterology

## 2017-08-31 ENCOUNTER — Other Ambulatory Visit: Payer: Self-pay

## 2017-08-31 DIAGNOSIS — I2510 Atherosclerotic heart disease of native coronary artery without angina pectoris: Secondary | ICD-10-CM | POA: Diagnosis not present

## 2017-08-31 DIAGNOSIS — K529 Noninfective gastroenteritis and colitis, unspecified: Secondary | ICD-10-CM | POA: Diagnosis not present

## 2017-08-31 DIAGNOSIS — Z1211 Encounter for screening for malignant neoplasm of colon: Secondary | ICD-10-CM

## 2017-08-31 DIAGNOSIS — K219 Gastro-esophageal reflux disease without esophagitis: Secondary | ICD-10-CM | POA: Diagnosis not present

## 2017-08-31 DIAGNOSIS — R1310 Dysphagia, unspecified: Secondary | ICD-10-CM

## 2017-08-31 DIAGNOSIS — R197 Diarrhea, unspecified: Secondary | ICD-10-CM | POA: Insufficient documentation

## 2017-08-31 MED ORDER — PEG 3350-KCL-NA BICARB-NACL 420 G PO SOLR
4000.0000 mL | ORAL | 0 refills | Status: DC
Start: 2017-08-31 — End: 2017-09-08

## 2017-08-31 NOTE — Assessment & Plan Note (Signed)
SYMPTOMS FAIRLY WELL CONTROLLED.  CONTINUE TO MONITOR SYMPTOMS. MAY NEED TO ADD PROTONIX. WILL SEE AFTER EGD/DIL. AVOID TRIGGERS.  HANDOUT GIVEN.

## 2017-08-31 NOTE — Assessment & Plan Note (Addendum)
MOST LIKELY DUE TO BILE SALT INDUCED DIARRHEA. DIFFERENTIAL DIAGNOSIS INCLUDES: PANCREATIC INUSFFICIENCY, MICROSCOPIC COLITIS. LESS LIKELY THYROID DISTURBANCE, GIARDIASIS, C DIFF COLITIS, CELIAC DISEASE, OR IBD.   CHEW ONE TUMS WITH MEALS THREE TIMES A DAY TO PREVENT DIARRHEA after eating.  COMPLETE COLONOSCOPY AND UPPER ENDOSCOPY TO STRETCH OUR ESOPHAGUS WITHIN THE NEXT 2-3 WEEKS. I PERSONALLY SPOKE WITH DR. [email or web address] TO HOLD ELIQUIS 2 DAYS PRIOR TO ENDOSCOPY. DISCUSSED PROCEDURE, BENEFITS, & RISKS: < 1% chance of medication reaction, bleeding, perforation, or rupture of spleen/liver.  COMPLETE ABDOMINAL ULTRASOUND NEXT WEEK TO CHECK YOUR PANCREAS.  COMPLETE LABS AND STOOL STUDIES.  FOLLOW UP IN 3 MOS.

## 2017-08-31 NOTE — Assessment & Plan Note (Signed)
SYMPTOMS NOT CONTROLLED IN PT WITH GERD AND NO PPI. DIFFERENTIAL DIAGNOSIS INCLUDES: PEPTIC STRICTURE, PRIMARY ESOPHAGEAL MOTILITY DISORDER,OR 2o ESOPHAGEAL MOTILITY DISORDER DUE TO UNCONTROLLED REFLUX, LESS LIKLEY ESOPHAGEAL CANCER.  EGD/DILATION IN 2-3 WEEK. DISCUSSED PROCEDURE, BENEFITS, & RISKS: < 1% chance of medication reaction, bleeding,OR Perforation.

## 2017-08-31 NOTE — Progress Notes (Addendum)
Subjective:    Patient ID: Pamela Soto, female    DOB: 1951-06-30, 66 y.o.   MRN: [id number] Nche, [name], NP  HPI PT C/OPOSTPRANDIAL DIARRHEA GETTING WORSE OVER LAST 5 YRS. PROBLEM STARTED WHEN SHE HAD GB REMOVED.  GAINED 50-60 LBS OVER PAST 5 YRS. NOW ON ELIQUIS FOR AFIB THAT OCCURRED DURING HEART  CATH FEB 2018.  HAS HURTING/CRAMPING THAT OCCURS IN ABDOMEN(CENTRAL/EPIGASTRIUM) ~30 MINS AFTER EATING. GET WATERY AND LOOSE STOOLS AND THEN PAIN RELIVED AFTER BMs. USES CPAP AT NIGHT. IF EATS ICE CREAM GETS GASSY. SOB DUE TO COPD. APPETIET OK. ENERGY LEVEL: LOW FOR YEARS. DENIES TOBACCO OR ETOH USE. SYMPTOMS DON'T OCCUR EVERY DAY BUT GETTING MORE FREQUENT. LAST ABD U/S 2014. FATHER HAD PANCREAS CANCER. TROUBE WITH FEELING LIKE FOOD GETS STUCK. DENIES HEARTBURN. NEVER HAD EGD/TCS.  PT DENIES FEVER, CHILLS, HEMATOCHEZIA, HEMATEMESIS, nausea, vomiting, melena, CHEST PAIN, CHANGE IN BOWEL IN HABITS, constipation, problems swallowing, problems with sedation, OR heartburn or indigestion.  Past Medical History:  Diagnosis Date  . Acid reflux   . Hypertension     Past Surgical History:  Procedure Laterality Date  . BREAST BIOPSY Right    normal result  . CESAREAN SECTION    . CHOLECYSTECTOMY  11/06/2012   Procedure: LAPAROSCOPIC CHOLECYSTECTOMY WITH INTRAOPERATIVE CHOLANGIOGRAM;  Surgeon: Pedro Earls, MD;  Location: [facility];  Service: General;  Laterality: N/A;  . RIGHT/LEFT HEART CATH AND CORONARY ANGIOGRAPHY N/A 01/10/2017   Procedure: Right/Left Heart Cath and Coronary Angiography;  Surgeon: Belva Crome, MD;  Location: [place] [facility];  Service: Cardiovascular;  Laterality: N/A;  . UMBILICAL HERNIA REPAIR  11/06/2012   Procedure: HERNIA REPAIR UMBILICAL ADULT;  Surgeon: Pedro Earls, MD;  Location: [facility];  Service: General;;    No Known Allergies  Current Outpatient Prescriptions  Medication Sig Dispense Refill  . acetaminophen (TYLENOL) 500 MG tablet Take 1 tablet (500  mg total) by mouth every 6 (six) hours as needed.    [name] albuterol (PROVENTIL) (2.5 MG/3ML) 0.083% nebulizer solution Take 3 mLs (2.5 mg total) by nebulization every 6 (six) hours as needed for wheezing or shortness of breath.    [name] apixaban (ELIQUIS) 5 MG TABS tablet Take 1 tablet (5 mg total) by mouth 2 (two) times daily.    [name] atorvastatin (LIPITOR) 20 MG tablet Take 1 tablet (20 mg total) by mouth daily.    . bisoprolol (ZEBETA) 5 MG tablet Take 1 tablet (5 mg total) by mouth daily.    . clopidogrel (PLAVIX) 75 MG tablet Take 1 tablet (75 mg total) by mouth daily.    . Coenzyme Q10 (CO Q-10) 50 MG CAPS Take 1 capsule by mouth 2 (two) times daily after a meal. (Patient taking differently: Take 1 capsule by mouth as needed. )    . furosemide (LASIX) 40 MG tablet Take 1 tablet (40 mg total) by mouth daily.    [name] lisinopril (PRINIVIL,ZESTRIL) 5 MG tablet Take 1 tablet (5 mg total) by mouth daily.    . potassium chloride SA (K-DUR,KLOR-CON) 20 MEQ tablet Take 1 tablet (20 mEq total) by mouth daily.    [name] tiotropium (SPIRIVA HANDIHALER) 18 MCG inhalation capsule Place 1 capsule (18 mcg total) into inhaler and inhale daily.     Family History  Problem Relation Age of Onset  . Heart disease Mother 86  . Heart attack Mother   . Asthma Father   . Cancer Father  pancreastic cancer  . Heart attack Brother 74  . Cancer Sister        unsure    Social History   Social History  . Marital status: Single    Spouse name: N/A  . Number of children: N/A  . Years of education: N/A   Occupational History  . Not on file.   Social History Main Topics  . Smoking status: Never Smoker  . Smokeless tobacco: Never Used  . Alcohol use No  . Drug use: No  . Sexual activity: Not on file   Other Topics Concern  . Not on file   Social History Narrative  . No narrative on file   Review of Systems PER HPI OTHERWISE ALL SYSTEMS ARE NEGATIVE.    Objective:   Physical Exam  Constitutional: She is  oriented to person, place, and time. She appears well-developed and well-nourished. No distress.  HENT:  Head: Normocephalic and atraumatic.  Mouth/Throat: Oropharynx is clear and moist. No oropharyngeal exudate.  UPPER AND LOWER PLATES.  Eyes: Pupils are equal, round, and reactive to light. No scleral icterus.  Neck: Normal range of motion. Neck supple.  Cardiovascular: Normal rate, regular rhythm and normal heart sounds.   Pulmonary/Chest: Effort normal and breath sounds normal. No respiratory distress.  Abdominal: Soft. Bowel sounds are normal. She exhibits no distension. There is no tenderness.  Musculoskeletal: She exhibits no edema.  Lymphadenopathy:    She has no cervical adenopathy.  Neurological: She is alert and oriented to person, place, and time.  NO FOCAL DEFICITS  Psychiatric: She has a normal mood and affect.  Vitals reviewed.     Assessment & Plan:

## 2017-08-31 NOTE — Patient Instructions (Addendum)
DRINK WATER TO KEEP YOUR URINE LIGHT YELLOW.  CONTINUE YOUR WEIGHT LOSS EFFORTS. TRANSITION TO A PLANT BASED DIET-NO MEAT OR DAIRY for 6 MOS. AVOID ITEMS THAT CAUSE BLOATING & GAS. I RECOMMEND THE BOOK, "PREVENT AND REVERSE HEART DISEASE", CALDWELL ESSELSTYN JR., MD. PAGE 120-121 CLEARLY STATE THE RULES AND QUICK AND EASY RECIPES FOR BREAKFAST, LUNCH, AND DINNER ARE AFTER P 127.   CHEW ONE TUMS WITH MEALS THREE TIMES A DAY TO PREVENT DIARRHEA after eating.  COMPLETE COLONOSCOPY AND UPPER ENDOSCOPY TO STRETCH OUR ESOPHAGUS WITHIN THE NEXT 2-3 WEEKS. HOLD ELIQUIS 2 DAYS PRIOR TO ENDOSCOPY.  COMPLETE ABDOMINAL ULTRASOUND NEXT WEEK TO CHECK YOUR PANCREAS.  COMPLETE LABS AND STOOL STUDIES.  FOLLOW UP IN 3 MOS.

## 2017-09-01 NOTE — Progress Notes (Signed)
CC'ED TO PCP 

## 2017-09-05 NOTE — Progress Notes (Signed)
ON RECALL  °

## 2017-09-06 ENCOUNTER — Ambulatory Visit (HOSPITAL_COMMUNITY)
Admission: RE | Admit: 2017-09-06 | Discharge: 2017-09-06 | Disposition: A | Discharge status: Home / Self Care | Payer: Medicare Other | Source: Ambulatory Visit | Attending: Gastroenterology | Admitting: Gastroenterology

## 2017-09-06 ENCOUNTER — Other Ambulatory Visit (HOSPITAL_COMMUNITY)
Admission: RE | Admit: 2017-09-06 | Discharge: 2017-09-06 | Disposition: A | Discharge status: Home / Self Care | Payer: Medicare Other | Source: Ambulatory Visit | Attending: Gastroenterology | Admitting: Gastroenterology

## 2017-09-06 DIAGNOSIS — R197 Diarrhea, unspecified: Secondary | ICD-10-CM | POA: Diagnosis not present

## 2017-09-06 DIAGNOSIS — R935 Abnormal findings on diagnostic imaging of other abdominal regions, including retroperitoneum: Secondary | ICD-10-CM | POA: Insufficient documentation

## 2017-09-06 DIAGNOSIS — Z9049 Acquired absence of other specified parts of digestive tract: Secondary | ICD-10-CM | POA: Diagnosis not present

## 2017-09-06 DIAGNOSIS — K529 Noninfective gastroenteritis and colitis, unspecified: Secondary | ICD-10-CM | POA: Diagnosis not present

## 2017-09-07 LAB — TISSUE TRANSGLUTAMINASE, IGA: Tissue Transglutaminase Ab, IgA: <2 U/mL (ref 0–3)

## 2017-09-07 LAB — LACTOFERRIN, FECAL, QUALITATIVE: LACTOFERRIN, FECAL, QUAL: NEGATIVE

## 2017-09-08 LAB — GIARDIA/CRYPTOSPORIDIUM EIA
Cryptosporidium EIA: NEGATIVE
GIARDIA AG STL: NEGATIVE

## 2017-09-08 NOTE — Progress Notes (Signed)
Pt is aware.  

## 2017-09-13 ENCOUNTER — Encounter (HOSPITAL_COMMUNITY): Payer: Self-pay | Admitting: *Deleted

## 2017-09-13 ENCOUNTER — Encounter (HOSPITAL_COMMUNITY)
Admission: RE | Disposition: A | Discharge status: Home / Self Care | Payer: Self-pay | Source: Ambulatory Visit | Attending: Gastroenterology

## 2017-09-13 ENCOUNTER — Ambulatory Visit (HOSPITAL_COMMUNITY)
Admission: RE | Admit: 2017-09-13 | Discharge: 2017-09-13 | Disposition: A | Discharge status: Home / Self Care | Payer: Medicare Other | Source: Ambulatory Visit | Attending: Gastroenterology | Admitting: Gastroenterology

## 2017-09-13 DIAGNOSIS — I10 Essential (primary) hypertension: Secondary | ICD-10-CM | POA: Diagnosis not present

## 2017-09-13 DIAGNOSIS — D123 Benign neoplasm of transverse colon: Secondary | ICD-10-CM | POA: Insufficient documentation

## 2017-09-13 DIAGNOSIS — R1310 Dysphagia, unspecified: Secondary | ICD-10-CM | POA: Diagnosis not present

## 2017-09-13 DIAGNOSIS — Z7901 Long term (current) use of anticoagulants: Secondary | ICD-10-CM | POA: Diagnosis not present

## 2017-09-13 DIAGNOSIS — K635 Polyp of colon: Secondary | ICD-10-CM | POA: Diagnosis not present

## 2017-09-13 DIAGNOSIS — Z79899 Other long term (current) drug therapy: Secondary | ICD-10-CM | POA: Insufficient documentation

## 2017-09-13 DIAGNOSIS — Z7902 Long term (current) use of antithrombotics/antiplatelets: Secondary | ICD-10-CM | POA: Insufficient documentation

## 2017-09-13 DIAGNOSIS — Q438 Other specified congenital malformations of intestine: Secondary | ICD-10-CM | POA: Diagnosis not present

## 2017-09-13 DIAGNOSIS — K222 Esophageal obstruction: Secondary | ICD-10-CM

## 2017-09-13 DIAGNOSIS — R1013 Epigastric pain: Secondary | ICD-10-CM | POA: Diagnosis not present

## 2017-09-13 DIAGNOSIS — K2970 Gastritis, unspecified, without bleeding: Secondary | ICD-10-CM

## 2017-09-13 DIAGNOSIS — B9681 Helicobacter pylori [H. pylori] as the cause of diseases classified elsewhere: Secondary | ICD-10-CM | POA: Diagnosis not present

## 2017-09-13 DIAGNOSIS — Z1211 Encounter for screening for malignant neoplasm of colon: Secondary | ICD-10-CM

## 2017-09-13 DIAGNOSIS — G8929 Other chronic pain: Secondary | ICD-10-CM

## 2017-09-13 DIAGNOSIS — K2950 Unspecified chronic gastritis without bleeding: Secondary | ICD-10-CM | POA: Insufficient documentation

## 2017-09-13 DIAGNOSIS — Z800 Family history of malignant neoplasm of digestive organs: Secondary | ICD-10-CM | POA: Insufficient documentation

## 2017-09-13 DIAGNOSIS — D125 Benign neoplasm of sigmoid colon: Secondary | ICD-10-CM | POA: Diagnosis not present

## 2017-09-13 DIAGNOSIS — K219 Gastro-esophageal reflux disease without esophagitis: Secondary | ICD-10-CM | POA: Insufficient documentation

## 2017-09-13 DIAGNOSIS — I4891 Unspecified atrial fibrillation: Secondary | ICD-10-CM | POA: Insufficient documentation

## 2017-09-13 DIAGNOSIS — J449 Chronic obstructive pulmonary disease, unspecified: Secondary | ICD-10-CM | POA: Diagnosis not present

## 2017-09-13 DIAGNOSIS — K648 Other hemorrhoids: Secondary | ICD-10-CM | POA: Diagnosis not present

## 2017-09-13 DIAGNOSIS — R197 Diarrhea, unspecified: Secondary | ICD-10-CM | POA: Insufficient documentation

## 2017-09-13 HISTORY — PX: ESOPHAGOGASTRODUODENOSCOPY: SHX5428

## 2017-09-13 HISTORY — DX: Sleep apnea, unspecified: G47.30

## 2017-09-13 HISTORY — PX: POLYPECTOMY: SHX5525

## 2017-09-13 HISTORY — PX: BIOPSY: SHX5522

## 2017-09-13 HISTORY — PX: COLONOSCOPY: SHX5424

## 2017-09-13 SURGERY — COLONOSCOPY
Anesthesia: Moderate Sedation

## 2017-09-13 MED ORDER — LIDOCAINE VISCOUS 2 % MT SOLN
OROMUCOSAL | Status: AC
  Filled 2017-09-13: qty 15

## 2017-09-13 MED ORDER — MIDAZOLAM HCL 5 MG/5ML IJ SOLN
INTRAMUSCULAR | Status: AC
  Filled 2017-09-13: qty 10

## 2017-09-13 MED ORDER — MEPERIDINE HCL 100 MG/ML IJ SOLN
INTRAMUSCULAR | Status: DC | PRN
  Administered 2017-09-13: 50 mg via INTRAVENOUS
  Administered 2017-09-13 (×2): 25 mg via INTRAVENOUS

## 2017-09-13 MED ORDER — SODIUM CHLORIDE 0.9 % IV SOLN
INTRAVENOUS | Status: DC
  Administered 2017-09-13: 14:00:00 via INTRAVENOUS

## 2017-09-13 MED ORDER — LIDOCAINE VISCOUS 2 % MT SOLN
OROMUCOSAL | Status: DC | PRN
  Administered 2017-09-13: 4 mL via OROMUCOSAL

## 2017-09-13 MED ORDER — MIDAZOLAM HCL 5 MG/5ML IJ SOLN
INTRAMUSCULAR | Status: DC | PRN
  Administered 2017-09-13: 1 mg via INTRAVENOUS
  Administered 2017-09-13 (×2): 2 mg via INTRAVENOUS

## 2017-09-13 MED ORDER — MINERAL OIL PO OIL
TOPICAL_OIL | ORAL | Status: AC
  Filled 2017-09-13: qty 30

## 2017-09-13 MED ORDER — SIMETHICONE 40 MG/0.6ML PO SUSP
ORAL | Status: DC | PRN
  Administered 2017-09-13: 15:00:00

## 2017-09-13 MED ORDER — MEPERIDINE HCL 100 MG/ML IJ SOLN
INTRAMUSCULAR | Status: AC
  Filled 2017-09-13: qty 2

## 2017-09-13 MED ORDER — PANTOPRAZOLE SODIUM 40 MG PO TBEC: DELAYED_RELEASE_TABLET | ORAL | 11 refills | Status: DC

## 2017-09-13 NOTE — Op Note (Addendum)
[facility] Patient Name: Pamela Soto Procedure Date: 09/13/2017 2:09 PM MRN: [id number] Date of Birth: [birth year] Attending MD: Barney Drain MD, MD CSN: [id number] Age: 66 Admit Type: Outpatient Procedure:                Colonoscopy WITH COLD FORCEPS/SNARE POLYPECTOMY Indications:              Clinically significant diarrhea of unexplained                            origin Providers:                Barney Drain MD, MD, Otis Peak B. Gwenlyn Perking RN, RN, Aram Candela Referring MD:              Medicines:                Meperidine 75 mg IV, Midazolam 4 mg IV Complications:            No immediate complications. Estimated Blood Loss:     Estimated blood loss was minimal. Procedure:                Pre-Anesthesia Assessment:                           - Prior to the procedure, a History and Physical                            was performed, and patient medications and                            allergies were reviewed. The patient's tolerance of                            previous anesthesia was also reviewed. The risks                            and benefits of the procedure and the sedation                            options and risks were discussed with the patient.                            All questions were answered, and informed consent                            was obtained. Prior Anticoagulants: The patient has                            taken Eliquis (apixaban), last dose was 3 days                            prior to procedure. ASA Grade Assessment: II - A  patient with mild systemic disease. After reviewing                            the risks and benefits, the patient was deemed in                            satisfactory condition to undergo the procedure.                            After obtaining informed consent, the colonoscope                            was passed under direct vision. Throughout the      procedure, the patient's blood pressure, pulse, and                            oxygen saturations were monitored continuously. The                            EC-[id number] ([id number]) scope was introduced through                            the anus and advanced to the the cecum, identified                            by appendiceal orifice and ileocecal valve. The                            colonoscopy was somewhat difficult due to a                            tortuous colon. Successful completion of the                            procedure was aided by COLOWRAP. The patient                            tolerated the procedure fairly well. The quality of                            the bowel preparation was good. The ileocecal                            valve, appendiceal orifice, and rectum were                            photographed. Scope In: 2:45:36 PM Scope Out: 3:06:42 PM Scope Withdrawal Time: 0 hours 18 minutes 41 seconds  Total Procedure Duration: 0 hours 21 minutes 6 seconds  Findings:      Four sessile polyps were found in the sigmoid colon(2: 1 CS) and       transverse colon(1: CS). The polyps were 3 to 5 mm in size. These polyps       were removed with a cold biopsy  forceps. Resection and retrieval were       complete. These polyps were removed with a cold snare. Resection and       retrieval were complete.      The recto-sigmoid colon and sigmoid colon were moderately redundant.       Biopsies for histology were taken with a cold forceps from the cecum,       ascending colon, transverse colon and descending colon for evaluation of       microscopic colitis. Estimated blood loss was minimal.      Internal hemorrhoids were found during retroflexion. Impression:               - Four 3 to 5 mm polyps in the sigmoid colon and in                            the transverse colon, removed with a cold biopsy                            forceps. Resected and retrieved.                            - Redundant LEFT colon.                           - Internal hemorrhoids. Moderate Sedation:      Moderate (conscious) sedation was administered by the endoscopy nurse       and supervised by the endoscopist. The following parameters were       monitored: oxygen saturation, heart rate, blood pressure, and response       to care. Total physician intraservice time was 50 minutes. Recommendation:           - Repeat colonoscopy in 5-10 years for surveillance.                           - Resume previous diet.                           - Continue present medications. RE-START ELIQUIS                            OCT 10.                           - Await pathology results.                           - Patient has a contact number available for                            emergencies. The signs and symptoms of potential                            delayed complications were discussed with the                            patient. Return to normal activities tomorrow.  Written discharge instructions were provided to the                            patient. Procedure Code(s):        --- Professional ---                           [phone], Colonoscopy, flexible; with removal of                            tumor(s), polyp(s), or other lesion(s) by snare                            technique                           45380, 59, Colonoscopy, flexible; with biopsy,                            single or multiple                           99152, Moderate sedation services provided by the                            same physician or other qualified health care                            professional performing the diagnostic or                            therapeutic service that the sedation supports,                            requiring the presence of an independent trained                            observer to assist in the monitoring of the                            patient's level  of consciousness and physiological                            status; initial 15 minutes of intraservice time,                            patient age 37 years or older                           [phone], Moderate sedation services; each additional                            15 minutes intraservice time                           99153, Moderate sedation services; each additional  15 minutes intraservice time Diagnosis Code(s):        --- Professional ---                           D12.5, Benign neoplasm of sigmoid colon                           D12.3, Benign neoplasm of transverse colon (hepatic                            flexure or splenic flexure)                           K64.8, Other hemorrhoids                           R19.7, Diarrhea, unspecified                           Q43.8, Other specified congenital malformations of                            intestine CPT copyright 2016 [employer]. All rights reserved. The codes documented in this report are preliminary and upon coder review may  be revised to meet current compliance requirements. Barney Drain, MD Barney Drain MD, MD 09/13/2017 3:42:04 PM This report has been signed electronically. Number of Addenda: 0

## 2017-09-13 NOTE — Interval H&P Note (Signed)
History and Physical Interval Note:  09/13/2017 2:19 PM  Pamela Soto  has presented today for surgery, with the diagnosis of SCREENING/DYSPHAGIA  The various methods of treatment have been discussed with the patient and family. After consideration of risks, benefits and other options for treatment, the patient has consented to  Procedure(s) with comments: COLONOSCOPY (N/A) - 245 ESOPHAGOGASTRODUODENOSCOPY (EGD) (N/A) as a surgical intervention .  The patient's history has been reviewed, patient examined, no change in status, stable for surgery.  I have reviewed the patient's chart and labs.  Questions were answered to the patient's satisfaction.     [employer]

## 2017-09-13 NOTE — Discharge Instructions (Signed)
YOUR ABDOMINAL PAIN AND DIARRHEA ARE MOST LIKELY DUE TO HAVING YOUR GALLBLADDER REMOVED and gastritis. You had FOUR small polyps removed. YOU HAVE MODERATE internal hemorrhoids.   OK TO RE-START ELIQUIS OCT 10.  CHEW ONE TUMS WITH MEALS THREE TIMES A DAY TO PREVENT DIARRHEA after eating.  To prevent ulcers and treat gastritis & REFLUX, START PROTONIX. TAKE 30 MINUTES PRIOR TO BREAKFAST.   CONTINUE YOUR WEIGHT LOSS EFFORTS.  DRINK WATER TO KEEP YOUR URINE LIGHT YELLOW.  FOLLOW A LOW FAT/HIGH FIBER DIET. AVOID ITEMS THAT CAUSE BLOATING AND GAS.  USE PREPARATION H FOUR TIMES  A DAY IF NEEDED TO RELIEVE RECTAL PAIN/PRESSURE/BLEEDING.  YOUR BIOPSY RESULTS WILL BE AVAILABLE IN MY CHART AFTER OCT 12 AND MY OFFICE WILL CONTACT YOU IN 10-14 DAYS WITH YOUR RESULTS.    FOLLOW UP IN DEC 2018.  Next colonoscopy IN 5-10 YEARS.  Colonoscopy Care After Read the instructions outlined below and refer to this sheet in the next week. These discharge instructions provide you with general information on caring for yourself after you leave the hospital. While your treatment has been planned according to the most current medical practices available, unavoidable complications occasionally occur. If you have any problems or questions after discharge, call DR. Nalini Alcaraz, [phone].  ACTIVITY  You may resume your regular activity, but move at a slower pace for the next 24 hours.   Take frequent rest periods for the next 24 hours.   Walking will help get rid of the air and reduce the bloated feeling in your belly (abdomen).   No driving for 24 hours (because of the medicine (anesthesia) used during the test).   You may shower.   Do not sign any important legal documents or operate any machinery for 24 hours (because of the anesthesia used during the test).    NUTRITION  Drink plenty of fluids.   You may resume your normal diet as instructed by your doctor.   Begin with a light meal and progress  to your normal diet. Heavy or fried foods are harder to digest and may make you feel sick to your stomach (nauseated).   Avoid alcoholic beverages for 24 hours or as instructed.    MEDICATIONS  You may resume your normal medications.   WHAT YOU CAN EXPECT TODAY  Some feelings of bloating in the abdomen.   Passage of more gas than usual.   Spotting of blood in your stool or on the toilet paper  .  IF YOU HAD POLYPS REMOVED DURING THE COLONOSCOPY:  Eat a soft diet IF YOU HAVE NAUSEA, BLOATING, ABDOMINAL PAIN, OR VOMITING.    FINDING OUT THE RESULTS OF YOUR TEST Not all test results are available during your visit. DR. Oneida Alar WILL CALL YOU WITHIN 14 DAYS OF YOUR PROCEDUE WITH YOUR RESULTS. Do not assume everything is normal if you have not heard from DR. Rajvir Ernster, CALL HER OFFICE AT [phone].  SEEK IMMEDIATE MEDICAL ATTENTION AND CALL THE OFFICE: [phone] IF:  You have more than a spotting of blood in your stool.   Your belly is swollen (abdominal distention).   You are nauseated or vomiting.   You have a temperature over 101F.   You have abdominal pain or discomfort that is severe or gets worse throughout the day.  High-Fiber Diet A high-fiber diet changes your normal diet to include more whole grains, legumes, fruits, and vegetables. Changes in the diet involve replacing refined carbohydrates with unrefined foods. The calorie level of the diet  is essentially unchanged. The Dietary Reference Intake (recommended amount) for adult males is 38 grams per day. For adult females, it is 25 grams per day. Pregnant and lactating women should consume 28 grams of fiber per day. Fiber is the intact part of a plant that is not broken down during digestion. Functional fiber is fiber that has been isolated from the plant to provide a beneficial effect in the body. PURPOSE  Increase stool bulk.   Ease and regulate bowel movements.   Lower cholesterol.   REDUCE RISK OF COLON  CANCER  INDICATIONS THAT YOU NEED MORE FIBER  Constipation and hemorrhoids.   Uncomplicated diverticulosis (intestine condition) and irritable bowel syndrome.   Weight management.   As a protective measure against hardening of the arteries (atherosclerosis), diabetes, and cancer.   GUIDELINES FOR INCREASING FIBER IN THE DIET  Start adding fiber to the diet slowly. A gradual increase of about 5 more grams (2 slices of whole-wheat bread, 2 servings of most fruits or vegetables, or 1 bowl of high-fiber cereal) per day is best. Too rapid an increase in fiber may result in constipation, flatulence, and bloating.   Drink enough water and fluids to keep your urine clear or pale yellow. Water, juice, or caffeine-free drinks are recommended. Not drinking enough fluid may cause constipation.   Eat a variety of high-fiber foods rather than one type of fiber.   Try to increase your intake of fiber through using high-fiber foods rather than fiber pills or supplements that contain small amounts of fiber.   The goal is to change the types of food eaten. Do not supplement your present diet with high-fiber foods, but replace foods in your present diet.   INCLUDE A VARIETY OF FIBER SOURCES  Replace refined and processed grains with whole grains, canned fruits with fresh fruits, and incorporate other fiber sources. White rice, white breads, and most bakery goods contain little or no fiber.   Brown whole-grain rice, buckwheat oats, and many fruits and vegetables are all good sources of fiber. These include: broccoli, Brussels sprouts, cabbage, cauliflower, beets, sweet potatoes, white potatoes (skin on), carrots, tomatoes, eggplant, squash, berries, fresh fruits, and dried fruits.   Cereals appear to be the richest source of fiber. Cereal fiber is found in whole grains and bran. Bran is the fiber-rich outer coat of cereal grain, which is largely removed in refining. In whole-grain cereals, the bran  remains. In breakfast cereals, the largest amount of fiber is found in those with "bran" in their names. The fiber content is sometimes indicated on the label.   You may need to include additional fruits and vegetables each day.   In baking, for 1 cup white flour, you may use the following substitutions:   1 cup whole-wheat flour minus 2 tablespoons.   1/2 cup white flour plus 1/2 cup whole-wheat flour.   Polyps, Colon  A polyp is extra tissue that grows inside your body. Colon polyps grow in the large intestine. The large intestine, also called the colon, is part of your digestive system. It is a long, hollow tube at the end of your digestive tract where your body makes and stores stool. Most polyps are not dangerous. They are benign. This means they are not cancerous. But over time, some types of polyps can turn into cancer. Polyps that are smaller than a pea are usually not harmful. But larger polyps could someday become or may already be cancerous. To be safe, doctors remove all polyps and  test them.   PREVENTION There is not one sure way to prevent polyps. You might be able to lower your risk of getting them if you:  Eat more fruits and vegetables and less fatty food.   Do not smoke.   Avoid alcohol.   Exercise every day.   Lose weight if you are overweight.   Eating more calcium and folate can also lower your risk of getting polyps. Some foods that are rich in calcium are milk, cheese, and broccoli. Some foods that are rich in folate are chickpeas, kidney beans, and spinach.     Hemorrhoids Hemorrhoids are dilated (enlarged) veins around the rectum. Sometimes clots will form in the veins. This makes them swollen and painful. These are called thrombosed hemorrhoids. Causes of hemorrhoids include:  Constipation.   Straining to have a bowel movement.   HEAVY LIFTING  HOME CARE INSTRUCTIONS  Eat a well balanced diet and drink 6 to 8 glasses of water every day to avoid  constipation. You may also use a bulk laxative.   Avoid straining to have bowel movements.   Keep anal area dry and clean.   Do not use a donut shaped pillow or sit on the toilet for long periods. This increases blood pooling and pain.   Move your bowels when your body has the urge; this will require less straining and will decrease pain and pressure.

## 2017-09-13 NOTE — H&P (View-Only) (Signed)
Subjective:    Patient ID: Pamela Soto, female    DOB: 06-Feb-1951, 66 y.o.   MRN: [id number] Nche, [name], NP  HPI PT C/OPOSTPRANDIAL DIARRHEA GETTING WORSE OVER LAST 5 YRS. PROBLEM STARTED WHEN SHE HAD GB REMOVED.  GAINED 50-60 LBS OVER PAST 5 YRS. NOW ON ELIQUIS FOR AFIB THAT OCCURRED DURING HEART  CATH FEB 2018.  HAS HURTING/CRAMPING THAT OCCURS IN ABDOMEN(CENTRAL/EPIGASTRIUM) ~30 MINS AFTER EATING. GET WATERY AND LOOSE STOOLS AND THEN PAIN RELIVED AFTER BMs. USES CPAP AT NIGHT. IF EATS ICE CREAM GETS GASSY. SOB DUE TO COPD. APPETIET OK. ENERGY LEVEL: LOW FOR YEARS. DENIES TOBACCO OR ETOH USE. SYMPTOMS DON'T OCCUR EVERY DAY BUT GETTING MORE FREQUENT. LAST ABD U/S 2014. FATHER HAD PANCREAS CANCER. TROUBE WITH FEELING LIKE FOOD GETS STUCK. DENIES HEARTBURN. NEVER HAD EGD/TCS.  PT DENIES FEVER, CHILLS, HEMATOCHEZIA, HEMATEMESIS, nausea, vomiting, melena, CHEST PAIN, CHANGE IN BOWEL IN HABITS, constipation, problems swallowing, problems with sedation, OR heartburn or indigestion.  Past Medical History:  Diagnosis Date  . Acid reflux   . Hypertension     Past Surgical History:  Procedure Laterality Date  . BREAST BIOPSY Right    normal result  . CESAREAN SECTION    . CHOLECYSTECTOMY  11/06/2012   Procedure: LAPAROSCOPIC CHOLECYSTECTOMY WITH INTRAOPERATIVE CHOLANGIOGRAM;  Surgeon: Pedro Earls, MD;  Location: [facility];  Service: General;  Laterality: N/A;  . RIGHT/LEFT HEART CATH AND CORONARY ANGIOGRAPHY N/A 01/10/2017   Procedure: Right/Left Heart Cath and Coronary Angiography;  Surgeon: Belva Crome, MD;  Location: [facility];  Service: Cardiovascular;  Laterality: N/A;  . UMBILICAL HERNIA REPAIR  11/06/2012   Procedure: HERNIA REPAIR UMBILICAL ADULT;  Surgeon: Pedro Earls, MD;  Location: [facility];  Service: General;;    No Known Allergies  Current Outpatient Prescriptions  Medication Sig Dispense Refill  . acetaminophen (TYLENOL) 500 MG tablet Take 1 tablet (500  mg total) by mouth every 6 (six) hours as needed.    [name] albuterol (PROVENTIL) (2.5 MG/3ML) 0.083% nebulizer solution Take 3 mLs (2.5 mg total) by nebulization every 6 (six) hours as needed for wheezing or shortness of breath.    [name] apixaban (ELIQUIS) 5 MG TABS tablet Take 1 tablet (5 mg total) by mouth 2 (two) times daily.    [name] atorvastatin (LIPITOR) 20 MG tablet Take 1 tablet (20 mg total) by mouth daily.    . bisoprolol (ZEBETA) 5 MG tablet Take 1 tablet (5 mg total) by mouth daily.    . clopidogrel (PLAVIX) 75 MG tablet Take 1 tablet (75 mg total) by mouth daily.    . Coenzyme Q10 (CO Q-10) 50 MG CAPS Take 1 capsule by mouth 2 (two) times daily after a meal. (Patient taking differently: Take 1 capsule by mouth as needed. )    . furosemide (LASIX) 40 MG tablet Take 1 tablet (40 mg total) by mouth daily.    [name] lisinopril (PRINIVIL,ZESTRIL) 5 MG tablet Take 1 tablet (5 mg total) by mouth daily.    . potassium chloride SA (K-DUR,KLOR-CON) 20 MEQ tablet Take 1 tablet (20 mEq total) by mouth daily.    [name] tiotropium (SPIRIVA HANDIHALER) 18 MCG inhalation capsule Place 1 capsule (18 mcg total) into inhaler and inhale daily.     Family History  Problem Relation Age of Onset  . Heart disease Mother 64  . Heart attack Mother   . Asthma Father   . Cancer Father  pancreastic cancer  . Heart attack Brother 85  . Cancer Sister        unsure    Social History   Social History  . Marital status: Single    Spouse name: N/A  . Number of children: N/A  . Years of education: N/A   Occupational History  . Not on file.   Social History Main Topics  . Smoking status: Never Smoker  . Smokeless tobacco: Never Used  . Alcohol use No  . Drug use: No  . Sexual activity: Not on file   Other Topics Concern  . Not on file   Social History Narrative  . No narrative on file   Review of Systems PER HPI OTHERWISE ALL SYSTEMS ARE NEGATIVE.    Objective:   Physical Exam  Constitutional: She is  oriented to person, place, and time. She appears well-developed and well-nourished. No distress.  HENT:  Head: Normocephalic and atraumatic.  Mouth/Throat: Oropharynx is clear and moist. No oropharyngeal exudate.  UPPER AND LOWER PLATES.  Eyes: Pupils are equal, round, and reactive to light. No scleral icterus.  Neck: Normal range of motion. Neck supple.  Cardiovascular: Normal rate, regular rhythm and normal heart sounds.   Pulmonary/Chest: Effort normal and breath sounds normal. No respiratory distress.  Abdominal: Soft. Bowel sounds are normal. She exhibits no distension. There is no tenderness.  Musculoskeletal: She exhibits no edema.  Lymphadenopathy:    She has no cervical adenopathy.  Neurological: She is alert and oriented to person, place, and time.  NO FOCAL DEFICITS  Psychiatric: She has a normal mood and affect.  Vitals reviewed.     Assessment & Plan:

## 2017-09-13 NOTE — Op Note (Signed)
[facility] [address] Patient Name: Pamela Soto Procedure Date: 09/13/2017 2:16 PM MRN: [id number] Date of Birth: 08-27-1951 Attending MD: Barney Drain MD, MD CSN: [id number] Age: 66 Admit Type: Outpatient Procedure:                Upper GI endoscopy WITH COLD FORCEPS                            BIOPSY/ESOPHAGEAL DILATION Indications:              Epigastric abdominal pain, Dysphagia Providers:                Barney Drain MD, MD, Gwenlyn Fudge RN, RN, Aram Candela Referring MD:              Medicines:                Meperidine 25 mg IV, Midazolam 1 mg IV Complications:            No immediate complications. Estimated Blood Loss:     Estimated blood loss was minimal. Procedure:                Pre-Anesthesia Assessment:                           - Prior to the procedure, a History and Physical                            was performed, and patient medications and                            allergies were reviewed. The patient's tolerance of                            previous anesthesia was also reviewed. The risks                            and benefits of the procedure and the sedation                            options and risks were discussed with the patient.                            All questions were answered, and informed consent                            was obtained. Prior Anticoagulants: The patient has                            taken Eliquis (apixaban), last dose was 3 days                            prior to procedure. ASA Grade Assessment: II - A  patient with mild systemic disease. After reviewing                            the risks and benefits, the patient was deemed in                            satisfactory condition to undergo the procedure.                            After obtaining informed consent, the endoscope was                            passed under direct vision. Throughout the                             procedure, the patient's blood pressure, pulse, and                            oxygen saturations were monitored continuously. The                            [id number]) scope was introduced through the                            mouth, and advanced to the second part of duodenum.                            The upper GI endoscopy was accomplished without                            difficulty. The patient tolerated the procedure                            fairly well. Scope In: 3:14:58 PM Scope Out: 3:27:01 PM Total Procedure Duration: 0 hours 12 minutes 3 seconds  Findings:      A web was found in the proximal esophagus. A guidewire was placed and       the scope was withdrawn. Dilation was performed with a Savary dilator       with mild resistance at 14 mm and 15 mm and moderate resistance at 16 mm       and 17 mm. Estimated blood loss was minimal.      Diffuse moderate inflammation characterized by congestion (edema),       erosions and erythema was found in the entire examined stomach. Biopsies       were taken with a cold forceps for Helicobacter pylori testing.      The examined duodenum was normal. Biopsies for histology were taken with       a cold forceps for evaluation of celiac disease. Impression:               - DYSPHAGI DUE TO GERD AND POSSIBLE Web in the                            proximal esophagus. Dilated.                           -  MODERATE Gastritis.                           - Moderate Sedation:      Moderate (conscious) sedation was administered by the endoscopy nurse       and supervised by the endoscopist. The following parameters were       monitored: oxygen saturation, heart rate, blood pressure, and response       to care. Total physician intraservice time was 50 minutes. Recommendation:           - Await pathology results.                           - Continue present medications.                           - Use Protonix (pantoprazole) 40 mg PO daily                             FOREVER.                           - Return to my office in 3 months.                           - Resume previous diet.                           - Patient has a contact number available for                            emergencies. The signs and symptoms of potential                            delayed complications were discussed with the                            patient. Return to normal activities tomorrow.                            Written discharge instructions were provided to the                            patient. Procedure Code(s):        --- Professional ---                           [phone], Esophagogastroduodenoscopy, flexible,                            transoral; with insertion of guide wire followed by                            passage of dilator(s) through esophagus over guide                            wire  89381, Esophagogastroduodenoscopy, flexible,                            transoral; with biopsy, single or multiple                           99152, Moderate sedation services provided by the                            same physician or other qualified health care                            professional performing the diagnostic or                            therapeutic service that the sedation supports,                            requiring the presence of an independent trained                            observer to assist in the monitoring of the                            patient's level of consciousness and physiological                            status; initial 15 minutes of intraservice time,                            patient age 80 years or older                           [phone], Moderate sedation services; each additional                            15 minutes intraservice time                           99153, Moderate sedation services; each additional                            15 minutes intraservice  time Diagnosis Code(s):        --- Professional ---                           Q39.4, Esophageal web                           K29.70, Gastritis, unspecified, without bleeding                           R10.13, Epigastric pain                           R13.10, Dysphagia, unspecified CPT copyright 2016 American [employer] Association.  All rights reserved. The codes documented in this report are preliminary and upon coder review may  be revised to meet current compliance requirements. Barney Drain, MD Barney Drain MD, MD 09/13/2017 3:46:16 PM This report has been signed electronically. Number of Addenda: 0

## 2017-09-20 ENCOUNTER — Encounter (HOSPITAL_COMMUNITY): Payer: Self-pay | Admitting: Gastroenterology

## 2017-09-22 ENCOUNTER — Ambulatory Visit (INDEPENDENT_AMBULATORY_CARE_PROVIDER_SITE_OTHER): Payer: Medicare Other | Admitting: Nurse Practitioner

## 2017-09-22 ENCOUNTER — Encounter: Payer: Self-pay | Admitting: Nurse Practitioner

## 2017-09-22 ENCOUNTER — Other Ambulatory Visit: Payer: Medicare Other

## 2017-09-22 VITALS — BP 130/74 | HR 53 | Temp 98.3°F | Ht 65.0 in | Wt 257.0 lb

## 2017-09-22 DIAGNOSIS — Z780 Asymptomatic menopausal state: Secondary | ICD-10-CM

## 2017-09-22 DIAGNOSIS — G4730 Sleep apnea, unspecified: Secondary | ICD-10-CM | POA: Insufficient documentation

## 2017-09-22 DIAGNOSIS — Z23 Encounter for immunization: Secondary | ICD-10-CM | POA: Diagnosis not present

## 2017-09-22 DIAGNOSIS — Z1159 Encounter for screening for other viral diseases: Secondary | ICD-10-CM

## 2017-09-22 DIAGNOSIS — Z114 Encounter for screening for human immunodeficiency virus [HIV]: Secondary | ICD-10-CM

## 2017-09-22 DIAGNOSIS — Z1239 Encounter for other screening for malignant neoplasm of breast: Secondary | ICD-10-CM

## 2017-09-22 DIAGNOSIS — Z1231 Encounter for screening mammogram for malignant neoplasm of breast: Secondary | ICD-10-CM

## 2017-09-22 DIAGNOSIS — F418 Other specified anxiety disorders: Secondary | ICD-10-CM

## 2017-09-22 DIAGNOSIS — R5382 Chronic fatigue, unspecified: Secondary | ICD-10-CM

## 2017-09-22 DIAGNOSIS — G4733 Obstructive sleep apnea (adult) (pediatric): Secondary | ICD-10-CM | POA: Insufficient documentation

## 2017-09-22 MED ORDER — THERA VITAL M PO TABS: 1.0000 | ORAL_TABLET | Freq: Every day | ORAL | Status: DC

## 2017-09-22 MED ORDER — BUSPIRONE HCL 7.5 MG PO TABS: 7.5000 mg | ORAL_TABLET | Freq: Three times a day (TID) | ORAL | 0 refills | Status: DC | PRN

## 2017-09-22 NOTE — Progress Notes (Signed)
Subjective:  Patient ID: Pamela Soto, female    DOB: 24-Jun-1951  Age: 66 y.o. MRN: [id number]  CC: Follow-up (6 mo fu--go over med. flu shot and pneumonia shot? going to [place])   Anxiety  Presents for initial visit. Onset was 1 to 6 months ago. The problem has been gradually worsening. Symptoms include decreased concentration, depressed mood, excessive worry, insomnia, irritability, nervous/anxious behavior and restlessness. Patient reports no palpitations or suicidal ideas. Symptoms occur constantly. The severity of symptoms is causing significant distress. Exacerbated by: declining health, inability to maintain ADLs without increase fatigue and SOB.   Her past medical history is significant for anxiety/panic attacks, arrhythmia, CAD, chronic lung disease and depression. There is no history of asthma or suicide attempts. Past treatments include nothing.   Sleep Apnea: Has difficulty with using CPAP machine. Has not contacted pulmonology for adjustment of mask or machine.  COPD: Persistent SOB with exertion.  Managed by pulmonology. Completion of cardiopulmonamry rehab with some improvement. She has joined [employer] but has not started going.  Outpatient Medications Prior to Visit  Medication Sig Dispense Refill  . acetaminophen (TYLENOL) 500 MG tablet Take 1 tablet (500 mg total) by mouth every 6 (six) hours as needed. (Patient taking differently: Take 500 mg by mouth every 6 (six) hours as needed for mild pain or moderate pain. ) 30 tablet 0  . albuterol (PROVENTIL) (2.5 MG/3ML) 0.083% nebulizer solution Take 3 mLs (2.5 mg total) by nebulization every 6 (six) hours as needed for wheezing or shortness of breath. 75 mL 12  . atorvastatin (LIPITOR) 20 MG tablet Take 1 tablet (20 mg total) by mouth daily. 90 tablet 1  . bisoprolol (ZEBETA) 5 MG tablet Take 1 tablet (5 mg total) by mouth daily. 90 tablet 1  . clopidogrel (PLAVIX) 75 MG tablet Take 1 tablet (75 mg total) by mouth daily. 90  tablet 1  . Coenzyme Q10 (CO Q-10) 50 MG CAPS Take 1 capsule by mouth 2 (two) times daily after a meal. (Patient taking differently: Take 1 capsule by mouth as needed. ) 60 each 0  . furosemide (LASIX) 40 MG tablet Take 1 tablet (40 mg total) by mouth daily. 90 tablet 1  . lisinopril (PRINIVIL,ZESTRIL) 5 MG tablet Take 1 tablet (5 mg total) by mouth daily. 90 tablet 1  . pantoprazole (PROTONIX) 40 MG tablet 1 po 30 mins prior to first meal 30 tablet 11  . potassium chloride SA (K-DUR,KLOR-CON) 20 MEQ tablet Take 1 tablet (20 mEq total) by mouth daily. 90 tablet 1  . tiotropium (SPIRIVA HANDIHALER) 18 MCG inhalation capsule Place 1 capsule (18 mcg total) into inhaler and inhale daily. 30 capsule 12   No facility-administered medications prior to visit.     ROS See HPI  Objective:  BP 130/74   Pulse (!) 53   Temp 98.3 F (36.8 C)   Ht 5\' 5"  (1.651 m)   Wt 257 lb (116.6 kg)   SpO2 94%   BMI 42.77 kg/m   BP Readings from Last 3 Encounters:  09/22/17 130/74  09/13/17 (!) 152/75  08/31/17 117/68    Wt Readings from Last 3 Encounters:  09/22/17 257 lb (116.6 kg)  09/13/17 254 lb (115.2 kg)  08/31/17 254 lb 6.4 oz (115.4 kg)    Physical Exam  Constitutional: She is oriented to person, place, and time. No distress.  Cardiovascular: Normal rate and regular rhythm.   Pulmonary/Chest: Effort normal. No respiratory distress.  Diminished lungs sounds  Musculoskeletal:  She exhibits no edema.  Neurological: She is alert and oriented to person, place, and time.  Psychiatric: She has a normal mood and affect.  Vitals reviewed.   Lab Results  Component Value Date   WBC 8.7 07/13/2017   HGB 13.3 07/13/2017   HCT 41.5 07/13/2017   PLT 266 07/13/2017   GLUCOSE 117 (H) 07/13/2017   CHOL 131 07/13/2017   TRIG 108 07/13/2017   HDL 48 07/13/2017   LDLCALC 61 07/13/2017   ALT 15 07/13/2017   AST 18 07/13/2017   NA 140 07/13/2017   K 4.0 07/13/2017   CL 102 07/13/2017   CREATININE  1.04 (H) 07/13/2017   BUN 16 07/13/2017   CO2 30 07/13/2017   TSH 2.186 07/13/2017   INR 0.96 01/10/2017   HGBA1C 6.6 (H) 07/13/2017    US Abdomen Complete  Result Date: 09/06/2017 CLINICAL DATA:  66 year old female post cholecystectomy with postprandial pain and diarrhea for 5 years. Initial encounter. EXAM: ABDOMEN ULTRASOUND COMPLETE COMPARISON:  11/05/2012 ultrasound. FINDINGS: Gallbladder: Postcholecystectomy. Common bile duct: Diameter: 3 mm proximally. Mid to distal aspect not visualized secondary to bowel gas Liver: Minimal increased echogenicity may represent mild fatty infiltration. No focal mass or intrahepatic biliary duct dilation. Portal vein is patent on color Doppler imaging with normal direction of blood flow towards the liver. IVC: No abnormality visualized. Pancreas: Visualized portion unremarkable. Spleen: Size and appearance within normal limits. Right Kidney: Length: 13.4 cm. No hydronephrosis or mass. Inferior pole region suboptimally evaluated secondary to bowel gas. Left Kidney: Length: 13.6 cm. No hydronephrosis. Lobular contour without mass identified. Abdominal aorta: No abdominal aortic aneurysm. Bifurcation not evaluated secondary to bowel gas. Other findings: None. IMPRESSION: Post cholecystectomy. Proximal common bile duct of normal size. Mid to distal aspect not visualized secondary to bowel gas. Minimal increased echogenicity of liver may represent changes of mild fatty infiltration without focal mass or intrahepatic biliary duct dilation. Suboptimal evaluation of the inferior aspect of the right kidney and aortic bifurcation secondary to overlying bowel gas. Electronically Signed   By: Genia Del M.D.   On: 09/06/2017 11:54    Assessment & Plan:   Pamela Soto was seen today for follow-up.  Diagnoses and all orders for this visit:  Chronic fatigue -     Multiple Vitamins-Minerals (MULTIVITAMIN) tablet; Take 1 tablet by mouth daily. Nature made brand  Sleep apnea,  unspecified type  Anxiety about health -     busPIRone (BUSPAR) 7.5 MG tablet; Take 1 tablet (7.5 mg total) by mouth every 8 (eight) hours as needed.  Breast cancer screening -     Cancel: MM SCREENING BREAST TOMO BILATERAL; Future -     Cancel: MM SCREENING BREAST TOMO UNI L; Future  Encounter for hepatitis C screening test for low risk patient -     Hepatitis C Antibody; Future  Asymptomatic postmenopausal estrogen deficiency -     DG Bone Density; Future  Encounter for screening for human immunodeficiency virus (HIV) -     HIV antibody; Future  Need for influenza vaccination -     Flu vaccine HIGH DOSE PF  Need for vaccination with 13-polyvalent pneumococcal conjugate vaccine -     Pneumococcal conjugate vaccine 13-valent IM   I am having Pamela Soto start on busPIRone and multivitamin. I am also having her maintain her albuterol, acetaminophen, Co Q-10, tiotropium, bisoprolol, clopidogrel, lisinopril, furosemide, potassium chloride SA, atorvastatin, pantoprazole, ELIQUIS, and GAVILYTE-N WITH FLAVOR PACK.  Meds ordered this encounter  Medications  .  ELIQUIS 5 MG TABS tablet    Sig: Take 5 mg by mouth 2 (two) times daily.    Refill:  3  . GAVILYTE-N WITH FLAVOR PACK 420 g solution    Sig: MIX AS DIRECTED AND TAKE 240 ML BY MOUTH EVERY 15 MINUTES    Refill:  0  . busPIRone (BUSPAR) 7.5 MG tablet    Sig: Take 1 tablet (7.5 mg total) by mouth every 8 (eight) hours as needed.    Dispense:  90 tablet    Refill:  0    Order Specific Question:   Supervising Provider    Answer:   Binnie Rail [[id number]]  . Multiple Vitamins-Minerals (MULTIVITAMIN) tablet    Sig: Take 1 tablet by mouth daily. Petra Kuba made brand    Dispense:  30 tablet    Order Specific Question:   Supervising Provider    Answer:   Binnie Rail [[id number]]    Follow-up: Return in about 4 weeks (around 10/20/2017) for anxiety.  Wilfred Lacy, NP

## 2017-09-22 NOTE — Patient Instructions (Addendum)
Please go to basement for blood draw.  Will contact cardiology about use of eliquis and plavix.

## 2017-09-23 ENCOUNTER — Other Ambulatory Visit: Payer: Self-pay | Admitting: Nurse Practitioner

## 2017-09-23 DIAGNOSIS — N6310 Unspecified lump in the right breast, unspecified quadrant: Secondary | ICD-10-CM

## 2017-09-23 DIAGNOSIS — N6320 Unspecified lump in the left breast, unspecified quadrant: Principal | ICD-10-CM

## 2017-09-23 LAB — HIV ANTIBODY (ROUTINE TESTING W REFLEX): HIV 1&2 Ab, 4th Generation: NONREACTIVE

## 2017-09-23 LAB — HEPATITIS C ANTIBODY
Hepatitis C Ab: NONREACTIVE
SIGNAL TO CUT-OFF: 0.03 (ref <1.00)

## 2017-09-26 DIAGNOSIS — F418 Other specified anxiety disorders: Secondary | ICD-10-CM | POA: Insufficient documentation

## 2017-09-26 DIAGNOSIS — R5382 Chronic fatigue, unspecified: Secondary | ICD-10-CM | POA: Insufficient documentation

## 2017-09-26 DIAGNOSIS — R4589 Other symptoms and signs involving emotional state: Secondary | ICD-10-CM | POA: Insufficient documentation

## 2017-09-27 ENCOUNTER — Telehealth: Payer: Self-pay | Admitting: Gastroenterology

## 2017-09-27 MED ORDER — CLARITHROMYCIN 500 MG PO TABS: ORAL_TABLET | ORAL | 0 refills | Status: DC

## 2017-09-27 MED ORDER — AMOXICILLIN 500 MG PO TABS: ORAL_TABLET | ORAL | 0 refills | Status: DC

## 2017-09-27 NOTE — Telephone Encounter (Signed)
Please call pt. She had ONE simple adenomas AND TWO HYPERPLASTIC POLYPS removed. SHE DOES NOT HAVE INFECTION OR A WHEAT ALLERGY AS THE CAUSE FOR HER LOOSE STOOLS/DIARRHEA. CHEW ONE TUMS WITH MEALS THREE TIMES A DAY TO PREVENT DIARRHEA after eating.  Her stomach Bx showed H. Pylori infection. She needs AMOXICILLIN 500 mg 2 po BID for 10 days and Biaxin 500 mg po bid for 10 days. She needs TO INCREASE HER PROTONIX TO BID for 10 days WHILE SHE IS TAKING HER ABX. HOLD ATORVASTATIN WHILE TAKING ABX. Med side effects include NAUSEA, VOMITING, DIARRHEA, ABDOMINAL pain, and metallic taste.  FOLLOW UP IN DEC 2018 E30 H PYLORI GASTRITIS/DIARRHEA.  NEXT COLONOSCOPY AT AGE 64-72.

## 2017-09-28 ENCOUNTER — Ambulatory Visit: Payer: Medicare Other

## 2017-09-28 ENCOUNTER — Ambulatory Visit
Admission: RE | Admit: 2017-09-28 | Discharge: 2017-09-28 | Disposition: A | Discharge status: Home / Self Care | Payer: Medicare Other | Source: Ambulatory Visit | Attending: Nurse Practitioner | Admitting: Nurse Practitioner

## 2017-09-28 DIAGNOSIS — N6320 Unspecified lump in the left breast, unspecified quadrant: Principal | ICD-10-CM

## 2017-09-28 DIAGNOSIS — N6310 Unspecified lump in the right breast, unspecified quadrant: Secondary | ICD-10-CM

## 2017-09-28 DIAGNOSIS — R928 Other abnormal and inconclusive findings on diagnostic imaging of breast: Secondary | ICD-10-CM | POA: Diagnosis not present

## 2017-09-28 NOTE — Telephone Encounter (Signed)
OV made and reminder in epic °

## 2017-09-28 NOTE — Telephone Encounter (Signed)
Pt notified of results and was educated on how to take medications. Pt will call back if she needs to discuss meds or pathology. Pts antibiotics have been sent to pts pharmacy by Dr. Oneida Alar.

## 2017-10-03 ENCOUNTER — Inpatient Hospital Stay: Admission: RE | Admit: 2017-10-03 | Payer: Medicare Other | Source: Ambulatory Visit

## 2017-10-05 ENCOUNTER — Ambulatory Visit: Payer: Medicare Other | Admitting: Gastroenterology

## 2017-10-10 ENCOUNTER — Ambulatory Visit (INDEPENDENT_AMBULATORY_CARE_PROVIDER_SITE_OTHER)
Admission: RE | Admit: 2017-10-10 | Discharge: 2017-10-10 | Disposition: A | Discharge status: Home / Self Care | Payer: Medicare Other | Source: Ambulatory Visit | Attending: Nurse Practitioner | Admitting: Nurse Practitioner

## 2017-10-10 DIAGNOSIS — Z780 Asymptomatic menopausal state: Secondary | ICD-10-CM | POA: Diagnosis not present

## 2017-10-13 ENCOUNTER — Other Ambulatory Visit: Payer: Self-pay | Admitting: Adult Health

## 2017-10-15 ENCOUNTER — Other Ambulatory Visit: Payer: Self-pay | Admitting: Adult Health

## 2017-11-11 ENCOUNTER — Other Ambulatory Visit: Payer: Self-pay | Admitting: Adult Health

## 2017-11-17 ENCOUNTER — Encounter: Payer: Self-pay | Admitting: Nurse Practitioner

## 2017-11-17 ENCOUNTER — Ambulatory Visit (INDEPENDENT_AMBULATORY_CARE_PROVIDER_SITE_OTHER): Payer: Medicare Other | Admitting: Nurse Practitioner

## 2017-11-17 VITALS — BP 129/68 | HR 65 | Temp 97.1°F | Ht 65.0 in | Wt 252.0 lb

## 2017-11-17 DIAGNOSIS — B9681 Helicobacter pylori [H. pylori] as the cause of diseases classified elsewhere: Secondary | ICD-10-CM

## 2017-11-17 DIAGNOSIS — K2970 Gastritis, unspecified, without bleeding: Secondary | ICD-10-CM

## 2017-11-17 DIAGNOSIS — K219 Gastro-esophageal reflux disease without esophagitis: Secondary | ICD-10-CM

## 2017-11-17 DIAGNOSIS — R1319 Other dysphagia: Secondary | ICD-10-CM

## 2017-11-17 DIAGNOSIS — I2510 Atherosclerotic heart disease of native coronary artery without angina pectoris: Secondary | ICD-10-CM

## 2017-11-17 DIAGNOSIS — R1310 Dysphagia, unspecified: Secondary | ICD-10-CM | POA: Diagnosis not present

## 2017-11-17 NOTE — Progress Notes (Signed)
CC'D TO PCP °

## 2017-11-17 NOTE — Assessment & Plan Note (Signed)
As it of H. pylori gastritis on upper endoscopy status post treatment with triple therapy.  It has been 8 weeks since diagnosis the patient completed treatment per recommendations.  We cannot check for eradication as recommended for all H. pylori patients.  I will have her hold her PPI for 2 weeks and complete H. pylori breath testing at the lab.  She can restart her PPI at that time.  She is to call us if she has any significant GERD symptoms while off PPI.  Otherwise continue current medications and follow-up in 6 months.

## 2017-11-17 NOTE — Patient Instructions (Addendum)
1. We will check a breath test to confirm that the bacteria in your stomach is gone. 2. He will need to stop taking Protonix for 2 weeks (14 days) before the test. 3. We will provide you with further instructions. 4. Otherwise, continue your current medications. 5. Restart your Protonix after you complete your breath test. 6. Return for follow-up in 6 months. 7. Call us before then if you have any questions, concerns, recurrent symptoms.

## 2017-11-17 NOTE — Assessment & Plan Note (Signed)
Symptoms essentially resolved on PPI.  She did misunderstand her instructions and remained on twice a day Protonix even after completing her antibiotics.  I told her when she restarts her Protonix after confirmation of H. pylori eradication she can proceed with once daily dosing.  Otherwise continue current medications, return for follow-up in 6 months.

## 2017-11-17 NOTE — Progress Notes (Signed)
Referring Provider: Flossie Buffy, NP Primary Care Physician:  Flossie Buffy, NP Primary GI:  Dr. Oneida Alar  Chief Complaint  Patient presents with  . Gastroesophageal Reflux    f/u. Doing okay  . Dysphagia    no problem    HPI:   Pamela Soto is a 66 y.o. female who presents for post procedure follow-up and GERD/dysphasia.  The patient was last seen in our office 08/31/2017 for postprandial diarrhea, GERD, dysphasia.  At that time it was noted her postprandial diarrhea was getting worse and started after cholecystectomy.  Has had significant weight gain over the last 5 years.  Noted central/epigastric abdominal pain after eating followed by watery/loose stools with improvement in pain.  Significant flatulence with ice cream.  She has had decreased energy for years.  Noted trouble with feeling like "food gets stuck."  Never had colonoscopy or endoscopy.  Diarrhea deemed most likely due to bile salt diarrhea recommended 1 times with meals 3 times a day.  Recommended colonoscopy and upper endoscopy with possible dilation.  To be completed 09/13/2017 which found four millimeters polyps in the sigmoid colon and transverse colon, redundant left colon, internal hemorrhoids.  Pathology found the polyps to be a mix of benign, hyperplastic, and tubular adenoma.  EGD completed the same day found dysphasia due to GERD and possible web in the proximal esophagus status post dilation, moderate gastritis status post duodenal biopsy and gastric biopsy.  Surgical pathology found the biopsies to be chronic active gastritis with H. pylori, no blunting suggestive of celiac disease.  Recommended start Protonix 40 mg daily forever.  Follow-up in 3 months.  Repeat colonoscopy in 5-7 years.  She was treated for H. pylori with amoxicillin/Biaxin/PPI.  Today she states she's doing well overall. No ongoing GERD symptoms. Still on PPI and has been taking it bid (confused about instructions to take it bid while  on abx; can no go to once daily dosing). Denies dysphagia at this time. Very rare abdominal pain which she attributes to dietary indiscretions (ie- Pizza). No vomiting. Denies hematochezia, melena, fever, chills, unintentional weight loss. Completed H. Pylori regimen as directed. Denies chest pain, dyspnea, dizziness, lightheadedness, syncope, near syncope. Denies any other upper or lower GI symptoms.  Past Medical History:  Diagnosis Date  . Acid reflux   . Hypertension   . Sleep apnea     Past Surgical History:  Procedure Laterality Date  . BIOPSY  09/13/2017   Procedure: BIOPSY;  Surgeon: Danie Binder, MD;  [facility];  Service: Endoscopy;;  colon duodenum gastric  . BREAST BIOPSY Right    normal result  . CESAREAN SECTION    . CHOLECYSTECTOMY  11/06/2012   Procedure: LAPAROSCOPIC CHOLECYSTECTOMY WITH INTRAOPERATIVE CHOLANGIOGRAM;  Surgeon: Pedro Earls, MD;  Location: [facility];  Service: General;  Laterality: N/A;  . COLONOSCOPY N/A 09/13/2017   Procedure: COLONOSCOPY;  Surgeon: Danie Binder, MD;  [facility];  Service: Endoscopy;  Laterality: N/A;  245  . ESOPHAGOGASTRODUODENOSCOPY N/A 09/13/2017   Procedure: ESOPHAGOGASTRODUODENOSCOPY (EGD);  Surgeon: Danie Binder, MD;  [facility];  Service: Endoscopy;  Laterality: N/A;  . POLYPECTOMY  09/13/2017   Procedure: POLYPECTOMY;  Surgeon: Danie Binder, MD;  [facility];  Service: Endoscopy;;  colon  . RIGHT/LEFT HEART CATH AND CORONARY ANGIOGRAPHY N/A 01/10/2017   Procedure: Right/Left Heart Cath and Coronary Angiography;  Surgeon: Belva Crome, MD;  Location: [facility];  Service: Cardiovascular;  Laterality: N/A;  . UMBILICAL HERNIA REPAIR  11/06/2012   Procedure: HERNIA REPAIR UMBILICAL ADULT;  Surgeon: Pedro Earls, MD;  Location: [facility];  Service: General;;    Current Outpatient Medications  Medication Sig Dispense Refill  . acetaminophen (TYLENOL) 500 MG  tablet Take 1 tablet (500 mg total) by mouth every 6 (six) hours as needed. (Patient taking differently: Take 500 mg by mouth every 6 (six) hours as needed for mild pain or moderate pain. ) 30 tablet 0  . albuterol (PROVENTIL) (2.5 MG/3ML) 0.083% nebulizer solution Take 3 mLs (2.5 mg total) by nebulization every 6 (six) hours as needed for wheezing or shortness of breath. 75 mL 12  . atorvastatin (LIPITOR) 20 MG tablet Take 1 tablet (20 mg total) by mouth daily. 90 tablet 1  . bisoprolol (ZEBETA) 5 MG tablet Take 1 tablet (5 mg total) by mouth daily. 90 tablet 1  . busPIRone (BUSPAR) 7.5 MG tablet Take 1 tablet (7.5 mg total) by mouth every 8 (eight) hours as needed. 90 tablet 0  . clopidogrel (PLAVIX) 75 MG tablet TAKE ONE TABLET BY MOUTH DAILY 90 tablet 1  . Coenzyme Q10 (CO Q-10) 50 MG CAPS Take 1 capsule by mouth 2 (two) times daily after a meal. (Patient taking differently: Take 1 capsule by mouth as needed. ) 60 each 0  . ELIQUIS 5 MG TABS tablet Take 5 mg by mouth 2 (two) times daily.  3  . furosemide (LASIX) 40 MG tablet TAKE ONE TABLET BY MOUTH DAILY 90 tablet 1  . lisinopril (PRINIVIL,ZESTRIL) 5 MG tablet TAKE ONE TABLET BY MOUTH DAILY 90 tablet 1  . Multiple Vitamins-Minerals (MULTIVITAMIN) tablet Take 1 tablet by mouth daily. Petra Kuba made brand 30 tablet   . pantoprazole (PROTONIX) 40 MG tablet 1 po 30 mins prior to first meal 30 tablet 11  . potassium chloride SA (K-DUR,KLOR-CON) 20 MEQ tablet TAKE ONE TABLET BY MOUTH DAILY 90 tablet 1  . tiotropium (SPIRIVA HANDIHALER) 18 MCG inhalation capsule Place 1 capsule (18 mcg total) into inhaler and inhale daily. 30 capsule 12   No current facility-administered medications for this visit.     Allergies as of 11/17/2017  . (No Known Allergies)    Family History  Problem Relation Age of Onset  . Heart disease Mother 34  . Heart attack Mother   . Asthma Father   . Cancer Father        pancreastic cancer  . Heart attack Brother 80  .  Cancer Sister        unsure  . Colon cancer Neg Hx     Social History   Socioeconomic History  . Marital status: Single    Spouse name: None  . Number of children: None  . Years of education: None  . Highest education level: None  Social Needs  . Financial resource strain: None  . Food insecurity - worry: None  . Food insecurity - inability: None  . Transportation needs - medical: None  . Transportation needs - non-medical: None  Occupational History  . None  Tobacco Use  . Smoking status: Never Smoker  . Smokeless tobacco: Never Used  Substance and Sexual Activity  . Alcohol use: No  . Drug use: No  . Sexual activity: None  Other Topics Concern  . None  Social History Narrative  . None    Review of Systems: Complete ROS negative except as per HPI.   Physical Exam: BP  129/68   Pulse 65   Temp (!) 97.1 F (36.2 C) (Oral)   Ht 5\' 5"  (1.651 m)   Wt 252 lb (114.3 kg)   BMI 41.93 kg/m  General:   Obese female. Alert and oriented. Pleasant and cooperative. Well-nourished and well-developed.  Eyes:  Without icterus, sclera clear and conjunctiva pink.  Ears:  Normal auditory acuity. Cardiovascular:  S1, S2 present with 1/6 blowing systolic murmur appreciated. Extremities without clubbing or edema. Respiratory:  Clear to auscultation bilaterally. No wheezes, rales, or rhonchi. No distress.  Gastrointestinal:  +BS, obese but soft, non-tender and non-distended. No HSM noted. No guarding or rebound. No masses appreciated.  Rectal:  Deferred  Musculoskalatal:  Symmetrical without gross deformities. Neurologic:  Alert and oriented x4;  grossly normal neurologically. Psych:  Alert and cooperative. Normal mood and affect. Heme/Lymph/Immune: No excessive bruising noted.    11/17/2017 9:42 AM   Disclaimer: This note was dictated with voice recognition software. Similar sounding words can inadvertently be transcribed and may not be corrected upon review.

## 2017-11-17 NOTE — Assessment & Plan Note (Signed)
Dysphasia resolved status post EGD with dilation.  No ongoing symptoms.  Continue to monitor, return for follow-up in 6 months.

## 2017-11-28 ENCOUNTER — Other Ambulatory Visit: Payer: Self-pay | Admitting: Adult Health

## 2017-12-01 ENCOUNTER — Other Ambulatory Visit: Payer: Self-pay | Admitting: Adult Health

## 2017-12-05 ENCOUNTER — Other Ambulatory Visit (HOSPITAL_COMMUNITY)
Admission: RE | Admit: 2017-12-05 | Discharge: 2017-12-05 | Disposition: A | Discharge status: Home / Self Care | Payer: Medicare Other | Source: Ambulatory Visit | Attending: Nurse Practitioner | Admitting: Nurse Practitioner

## 2017-12-05 DIAGNOSIS — K2970 Gastritis, unspecified, without bleeding: Secondary | ICD-10-CM | POA: Insufficient documentation

## 2017-12-08 DIAGNOSIS — K2970 Gastritis, unspecified, without bleeding: Secondary | ICD-10-CM | POA: Diagnosis not present

## 2017-12-08 DIAGNOSIS — B9681 Helicobacter pylori [H. pylori] as the cause of diseases classified elsewhere: Secondary | ICD-10-CM | POA: Diagnosis not present

## 2017-12-09 LAB — H. PYLORI BREATH TEST: H. PYLORI BREATH TEST: NOT DETECTED

## 2017-12-12 NOTE — Progress Notes (Signed)
LMOM for a return call.  

## 2017-12-12 NOTE — Progress Notes (Signed)
Letter mailed to pt with results

## 2017-12-15 ENCOUNTER — Other Ambulatory Visit: Payer: Self-pay | Admitting: Cardiology

## 2017-12-23 ENCOUNTER — Encounter (HOSPITAL_COMMUNITY): Payer: Self-pay | Admitting: Emergency Medicine

## 2017-12-23 ENCOUNTER — Emergency Department (HOSPITAL_COMMUNITY): Payer: Medicare Other

## 2017-12-23 ENCOUNTER — Other Ambulatory Visit: Payer: Self-pay

## 2017-12-23 ENCOUNTER — Emergency Department (HOSPITAL_COMMUNITY)
Admission: EM | Admit: 2017-12-23 | Discharge: 2017-12-23 | Disposition: A | Discharge status: Home / Self Care | Payer: Medicare Other | Attending: Emergency Medicine | Admitting: Emergency Medicine

## 2017-12-23 DIAGNOSIS — Z7901 Long term (current) use of anticoagulants: Secondary | ICD-10-CM | POA: Diagnosis not present

## 2017-12-23 DIAGNOSIS — G510 Bell's palsy: Secondary | ICD-10-CM | POA: Diagnosis not present

## 2017-12-23 DIAGNOSIS — R29810 Facial weakness: Secondary | ICD-10-CM | POA: Diagnosis not present

## 2017-12-23 DIAGNOSIS — Z79899 Other long term (current) drug therapy: Secondary | ICD-10-CM | POA: Insufficient documentation

## 2017-12-23 DIAGNOSIS — I2510 Atherosclerotic heart disease of native coronary artery without angina pectoris: Secondary | ICD-10-CM | POA: Insufficient documentation

## 2017-12-23 DIAGNOSIS — I10 Essential (primary) hypertension: Secondary | ICD-10-CM | POA: Insufficient documentation

## 2017-12-23 DIAGNOSIS — J449 Chronic obstructive pulmonary disease, unspecified: Secondary | ICD-10-CM | POA: Diagnosis not present

## 2017-12-23 DIAGNOSIS — R51 Headache: Secondary | ICD-10-CM | POA: Diagnosis not present

## 2017-12-23 LAB — COMPREHENSIVE METABOLIC PANEL
ALT: 13 U/L — ABNORMAL LOW (ref 14–54)
ANION GAP: 9 (ref 5–15)
AST: 17 U/L (ref 15–41)
Albumin: 3.6 g/dL (ref 3.5–5.0)
Alkaline Phosphatase: 58 U/L (ref 38–126)
BILIRUBIN TOTAL: 1.2 mg/dL (ref 0.3–1.2)
BUN: 9 mg/dL (ref 6–20)
CHLORIDE: 104 mmol/L (ref 101–111)
CO2: 31 mmol/L (ref 22–32)
Calcium: 9 mg/dL (ref 8.9–10.3)
Creatinine, Ser: 0.99 mg/dL (ref 0.44–1.00)
GFR, EST NON AFRICAN AMERICAN: 58 mL/min — AB (ref >60)
Glucose, Bld: 142 mg/dL — ABNORMAL HIGH (ref 65–99)
POTASSIUM: 3.8 mmol/L (ref 3.5–5.1)
Sodium: 144 mmol/L (ref 135–145)
TOTAL PROTEIN: 7.1 g/dL (ref 6.5–8.1)

## 2017-12-23 LAB — DIFFERENTIAL
BASOS PCT: 0 %
Basophils Absolute: 0 10*3/uL (ref 0.0–0.1)
EOS ABS: 0.1 10*3/uL (ref 0.0–0.7)
EOS PCT: 1 %
LYMPHS ABS: 1.8 10*3/uL (ref 0.7–4.0)
Lymphocytes Relative: 25 %
MONO ABS: 0.3 10*3/uL (ref 0.1–1.0)
MONOS PCT: 4 %
Neutro Abs: 4.8 10*3/uL (ref 1.7–7.7)
Neutrophils Relative %: 70 %

## 2017-12-23 LAB — CBC
HCT: 40.7 % (ref 36.0–46.0)
Hemoglobin: 12.9 g/dL (ref 12.0–15.0)
MCH: 30 pg (ref 26.0–34.0)
MCHC: 31.7 g/dL (ref 30.0–36.0)
MCV: 94.7 fL (ref 78.0–100.0)
Platelets: 241 10*3/uL (ref 150–400)
RBC: 4.3 MIL/uL (ref 3.87–5.11)
RDW: 13.2 % (ref 11.5–15.5)
WBC: 7 10*3/uL (ref 4.0–10.5)

## 2017-12-23 LAB — I-STAT CHEM 8, ED
BUN: 6 mg/dL (ref 6–20)
CALCIUM ION: 1.13 mmol/L — AB (ref 1.15–1.40)
Chloride: 101 mmol/L (ref 101–111)
Creatinine, Ser: 1 mg/dL (ref 0.44–1.00)
Glucose, Bld: 145 mg/dL — ABNORMAL HIGH (ref 65–99)
HCT: 39 % (ref 36.0–46.0)
HEMOGLOBIN: 13.3 g/dL (ref 12.0–15.0)
Potassium: 3.8 mmol/L (ref 3.5–5.1)
SODIUM: 141 mmol/L (ref 135–145)
TCO2: 30 mmol/L (ref 22–32)

## 2017-12-23 LAB — PROTIME-INR
INR: 1.22
Prothrombin Time: 15.3 seconds — ABNORMAL HIGH (ref 11.4–15.2)

## 2017-12-23 LAB — APTT: APTT: 38 s — AB (ref 24–36)

## 2017-12-23 LAB — I-STAT TROPONIN, ED: TROPONIN I, POC: 0 ng/mL (ref 0.00–0.08)

## 2017-12-23 LAB — CBG MONITORING, ED: Glucose-Capillary: 142 mg/dL — ABNORMAL HIGH (ref 65–99)

## 2017-12-23 MED ORDER — VALACYCLOVIR HCL 1 G PO TABS: ORAL_TABLET | ORAL | 0 refills | Status: DC

## 2017-12-23 MED ORDER — PREDNISONE 20 MG PO TABS: ORAL_TABLET | ORAL | 0 refills | Status: DC

## 2017-12-23 MED ORDER — HYPROMELLOSE (GONIOSCOPIC) 2.5 % OP SOLN: OPHTHALMIC | 12 refills | Status: DC

## 2017-12-23 MED ORDER — ARTIFICIAL TEARS OPHTHALMIC OINT: TOPICAL_OINTMENT | OPHTHALMIC | 1 refills | Status: DC

## 2017-12-23 NOTE — ED Notes (Signed)
ED Provider at bedside. 

## 2017-12-23 NOTE — ED Triage Notes (Signed)
Patient states she went to bed last night with a headache, woke up this am with L sided facial droop. Patient denies other weakness or deficit.

## 2017-12-23 NOTE — ED Provider Notes (Signed)
Patient with Bell's palsy.  MRI was negative for any stroke.  Patient will be placed on Valtrex and prednisone Lacri-Lube and artificial tears.  She is also told to increase her Protonix so she is taking it twice a day.  She is to follow-up with her doctor next week and return if she has any bleeding from her rectum.  She was also told to stop the prednisone if she starts bleeding   Milton Ferguson, MD 12/23/17 1732

## 2017-12-23 NOTE — ED Notes (Signed)
Patient transported to MRI 

## 2017-12-23 NOTE — Discharge Instructions (Signed)
Increase taking her Protonix so you are taking it twice a day.  Follow-up with your family doctor next week.  If you notice any bleeding from your rectum or black stools then you should stop taking the prednisone and come and get evaluated

## 2017-12-23 NOTE — ED Notes (Signed)
Pt returned from MRI °

## 2017-12-23 NOTE — ED Provider Notes (Signed)
[facility] EMERGENCY DEPARTMENT Provider Note   CSN: [id number] Arrival date & time: 12/23/17  1355     History   Chief Complaint Chief Complaint  Patient presents with  . Facial Droop    HPI Pamela Soto is a 67 y.o. female.  HPI Patient had mild left occipital headache yesterday evening.  Woke this morning and noticed left-sided facial droop this morning.  Denies any extremity weakness or numbness.  No visual changes.  Able ambulate without difficulty.  No speech changes. Past Medical History:  Diagnosis Date  . Acid reflux   . Hypertension   . Sleep apnea     Patient Active Problem List   Diagnosis Date Noted  . Helicobacter pylori gastritis 11/17/2017  . Chronic fatigue 09/26/2017  . Anxiety about health 09/26/2017  . Sleep apnea 09/22/2017  . Polyp of colon   . Abdominal pain, chronic, epigastric   . Diarrhea 08/31/2017  . Dysphagia 08/31/2017  . Paroxysmal atrial fibrillation (HCC)   . Coronary artery disease involving native coronary artery of native heart   . Impaired glucose tolerance 01/07/2017  . Acute systolic heart failure ([place]) 01/06/2017  . Acute respiratory failure with hypoxia ([place]) 01/05/2017  . Hyperglycemia 01/05/2017  . Acute exacerbation of chronic obstructive pulmonary disease (COPD) ([place])   . COPD exacerbation ([place]) 01/04/2017  . S/P laparoscopic cholecystectomy + IOC Dec 2013 11/06/2012  . GERD (gastroesophageal reflux disease) 06/05/2008  . UTI'S, HX OF 06/05/2008  . CHEST PAIN, HX OF 05/30/2008    Past Surgical History:  Procedure Laterality Date  . BIOPSY  09/13/2017   Procedure: BIOPSY;  Surgeon: Danie Binder, MD;  [facility];  Service: Endoscopy;;  colon duodenum gastric  . BREAST BIOPSY Right    normal result  . CESAREAN SECTION    . CHOLECYSTECTOMY  11/06/2012   Procedure: LAPAROSCOPIC CHOLECYSTECTOMY WITH INTRAOPERATIVE CHOLANGIOGRAM;  Surgeon: Pedro Earls, MD;  [facility];  Service: General;   Laterality: N/A;  . COLONOSCOPY N/A 09/13/2017   Procedure: COLONOSCOPY;  Surgeon: Danie Binder, MD;  [facility];  Service: Endoscopy;  Laterality: N/A;  245  . ESOPHAGOGASTRODUODENOSCOPY N/A 09/13/2017   Procedure: ESOPHAGOGASTRODUODENOSCOPY (EGD);  Surgeon: Danie Binder, MD;  [facility];  Service: Endoscopy;  Laterality: N/A;  . POLYPECTOMY  09/13/2017   Procedure: POLYPECTOMY;  Surgeon: Danie Binder, MD;  [facility];  Service: Endoscopy;;  colon  . RIGHT/LEFT HEART CATH AND CORONARY ANGIOGRAPHY N/A 01/10/2017   Procedure: Right/Left Heart Cath and Coronary Angiography;  Surgeon: Belva Crome, MD;  Location: [facility];  Service: Cardiovascular;  Laterality: N/A;  . UMBILICAL HERNIA REPAIR  11/06/2012   Procedure: HERNIA REPAIR UMBILICAL ADULT;  Surgeon: Pedro Earls, MD;  [facility]: [facility];  Service: General;;    OB History    Gravida Para Term Preterm AB Living   3 3 3     2    SAB TAB Ectopic Multiple Live Births                   Home Medications    Prior to Admission medications   Medication Sig Start Date End Date Taking? Authorizing Provider  acetaminophen (TYLENOL) 500 MG tablet Take 1 tablet (500 mg total) by mouth every 6 (six) hours as needed. Patient taking differently: Take 500 mg by mouth every 6 (six) hours as needed for mild pain or moderate pain.  02/17/17  Yes  Nche, [name], NP  albuterol (PROVENTIL) (2.5 MG/3ML) 0.083% nebulizer solution Take 3 mLs (2.5 mg total) by nebulization every 6 (six) hours as needed for wheezing or shortness of breath. 01/14/17  Yes Thurnell Lose, MD  atorvastatin (LIPITOR) 20 MG tablet TAKE ONE TABLET BY MOUTH EVERY DAY 12/15/17  Yes Satira Sark, MD  bisoprolol (ZEBETA) 5 MG tablet TAKE ONE TABLET BY MOUTH DAILY 12/01/17  Yes Lendon Colonel, NP  busPIRone (BUSPAR) 7.5 MG tablet Take 1 tablet (7.5 mg total) by mouth every 8 (eight) hours as needed. 09/22/17  Yes Nche,  [name], NP  clopidogrel (PLAVIX) 75 MG tablet TAKE ONE TABLET BY MOUTH DAILY 10/13/17  Yes Branch, Alphonse Guild, MD  Coenzyme Q10 (CO Q-10) 50 MG CAPS Take 1 capsule by mouth 2 (two) times daily after a meal. Patient taking differently: Take 1 capsule by mouth as needed.  02/17/17  Yes Nche, [name], NP  ELIQUIS 5 MG TABS tablet TAKE ONE TABLET BY MOUTH TWICE DAILY 11/30/17  Yes Branch, Alphonse Guild, MD  furosemide (LASIX) 40 MG tablet TAKE ONE TABLET BY MOUTH DAILY 11/11/17  Yes Branch, Alphonse Guild, MD  lisinopril (PRINIVIL,ZESTRIL) 5 MG tablet TAKE ONE TABLET BY MOUTH DAILY 10/13/17  Yes Branch, Alphonse Guild, MD  Multiple Vitamins-Minerals (MULTIVITAMIN) tablet Take 1 tablet by mouth daily. Petra Kuba made brand 09/22/17  Yes Nche, [name], NP  pantoprazole (PROTONIX) 40 MG tablet 1 po 30 mins prior to first meal 09/13/17  Yes Fields, Marga Melnick, MD  potassium chloride SA (K-DUR,KLOR-CON) 20 MEQ tablet TAKE ONE TABLET BY MOUTH DAILY 10/17/17  Yes Branch, Alphonse Guild, MD  tiotropium (SPIRIVA HANDIHALER) 18 MCG inhalation capsule Place 1 capsule (18 mcg total) into inhaler and inhale daily. 03/16/17  Yes BranchAlphonse Guild, MD  artificial tears (LACRILUBE) OINT ophthalmic ointment Use in left eye at night and tape eye shut 12/23/17   Milton Ferguson, MD  hydroxypropyl methylcellulose / hypromellose (ISOPTO TEARS / GONIOVISC) 2.5 % ophthalmic solution Use in left eye during the day every 2-3 hours as needed for dry eye 12/23/17   Milton Ferguson, MD  predniSONE (DELTASONE) 20 MG tablet Take 3 pills a day for 6 days.  Start tomorrow sat 1/19 12/23/17   Milton Ferguson, MD  valACYclovir Estell Harpin) 1000 MG tablet Take one pill tid 12/23/17   Milton Ferguson, MD    Family History Family History  Problem Relation Age of Onset  . Heart disease Mother 75  . Heart attack Mother   . Asthma Father   . Cancer Father        pancreastic cancer  . Heart attack Brother 86  . Cancer Sister        unsure  . Colon  cancer Neg Hx     Social History Social History   Tobacco Use  . Smoking status: Never Smoker  . Smokeless tobacco: Never Used  Substance Use Topics  . Alcohol use: No  . Drug use: No     Allergies   Patient has no known allergies.   Review of Systems Review of Systems  Constitutional: Negative for chills and fever.  HENT: Negative for congestion, rhinorrhea, sinus pressure, sinus pain, trouble swallowing and voice change.   Eyes: Negative for photophobia.  Respiratory: Negative for chest tightness and shortness of breath.   Cardiovascular: Negative for chest pain.  Gastrointestinal: Negative for abdominal pain, diarrhea, nausea and vomiting.  Musculoskeletal: Negative for back pain, gait problem and neck pain.  Skin: Negative for rash and wound.  Neurological: Positive for facial asymmetry and headaches. Negative for dizziness, speech difficulty, weakness, light-headedness and numbness.  All other systems reviewed and are negative.    Physical Exam Updated Vital Signs BP (!) 141/51 (BP Location: Right Arm)   Pulse 64   Temp 98.7 F (37.1 C) (Oral)   Resp 18   Ht 5' 5.5" (1.664 m)   Wt 115.7 kg (255 lb)   SpO2 94%   BMI 41.79 kg/m   Physical Exam  Constitutional: She is oriented to person, place, and time. She appears well-developed and well-nourished. No distress.  HENT:  Head: Normocephalic and atraumatic.  Mouth/Throat: Oropharynx is clear and moist. No oropharyngeal exudate.  Left upper and lower facial weakness.  No numbness.  Eyes: EOM are normal. Pupils are equal, round, and reactive to light.  Neck: Normal range of motion. Neck supple.  Cardiovascular: Normal rate and regular rhythm. Exam reveals no gallop and no friction rub.  No murmur heard. Pulmonary/Chest: Effort normal and breath sounds normal.  Abdominal: Soft. Bowel sounds are normal. There is no tenderness. There is no rebound and no guarding.  Musculoskeletal: Normal range of motion. She  exhibits no edema or tenderness.  Neurological: She is alert and oriented to person, place, and time.  Patient is alert and oriented x3 with clear, goal oriented speech. Patient has 5/5 motor in all extremities. Sensation is intact to light touch. Bilateral finger-to-nose is normal with no signs of dysmetria. Patient has a normal gait and walks without assistance.  Skin: Skin is warm and dry. No rash noted. She is not diaphoretic. No erythema.  Psychiatric: She has a normal mood and affect. Her behavior is normal.  Nursing note and vitals reviewed.    ED Treatments / Results  Labs (all labs ordered are listed, but only abnormal results are displayed) Labs Reviewed  PROTIME-INR - Abnormal; Notable for the following components:      Result Value   Prothrombin Time 15.3 (*)    All other components within normal limits  APTT - Abnormal; Notable for the following components:   aPTT 38 (*)    All other components within normal limits  COMPREHENSIVE METABOLIC PANEL - Abnormal; Notable for the following components:   Glucose, Bld 142 (*)    ALT 13 (*)    GFR calc non Af Amer 58 (*)    All other components within normal limits  CBG MONITORING, ED - Abnormal; Notable for the following components:   Glucose-Capillary 142 (*)    All other components within normal limits  I-STAT CHEM 8, ED - Abnormal; Notable for the following components:   Glucose, Bld 145 (*)    Calcium, Ion 1.13 (*)    All other components within normal limits  CBC  DIFFERENTIAL  I-STAT TROPONIN, ED    EKG  EKG Interpretation None       Radiology Ct Head Wo Contrast  Result Date: 12/23/2017 CLINICAL DATA:  Headache and left-sided facial droop EXAM: CT HEAD WITHOUT CONTRAST TECHNIQUE: Contiguous axial images were obtained from the base of the skull through the vertex without intravenous contrast. COMPARISON:  None. FINDINGS: Brain: The ventricles are normal in size and configuration. There is slight invagination  of CSF into the sella. There is no intracranial mass, hemorrhage, extra-axial fluid collection, or midline shift. There is mild patchy small vessel disease in the centra semiovale bilaterally. Elsewhere gray-white compartments appear normal. No acute infarct is appreciable. Vascular: There is  no hyperdense vessel. There is no appreciable vascular calcification. Skull: The bony calvarium appears intact. Sinuses/Orbits: There is slight mucosal thickening in several ethmoid air cells. Other visualized paranasal sinuses are clear. Orbits appear symmetric bilaterally. Other: Visualized mastoid air cells are clear. IMPRESSION: Mild patchy small vessel disease in the centra semiovale. No acute infarct is demonstrable on this study. No mass or hemorrhage. Mild mucosal thickening in several ethmoid air cells. Electronically Signed   By: Lowella Grip III M.D.   On: 12/23/2017 14:40   Mr Brain Wo Contrast  Result Date: 12/23/2017 CLINICAL DATA:  67 year old female with headache and left side weakness today. EXAM: MRI HEAD WITHOUT CONTRAST TECHNIQUE: Multiplanar, multiecho pulse sequences of the brain and surrounding structures were obtained without intravenous contrast. COMPARISON:  Head CT without contrast 1422 hr today. FINDINGS: Brain: No restricted diffusion to suggest acute infarction. No midline shift, mass effect, evidence of mass lesion, ventriculomegaly, extra-axial collection or acute intracranial hemorrhage. Cervicomedullary junction and pituitary are within normal limits. Scattered small bilateral cerebral white matter T2 and FLAIR hyperintense foci, many subcortical, are present in a nonspecific configuration. The extent is moderate for age. No cortical encephalomalacia or chronic cerebral blood products. Deep gray matter nuclei, brainstem, and cerebellum appear normal. Vascular: Major intracranial vascular flow voids are preserved. Skull and upper cervical spine: Negative visualized cervical spine.  Hyperostosis of the calvarium, probably a normal variant. Normal marrow signal at the skull base. Sinuses/Orbits: Orbits soft tissues appear normal. Paranasal sinuses and mastoids are stable and well pneumatized. Other: Visible internal auditory structures appear normal. Scalp and face soft tissues appear negative. IMPRESSION: 1.  No acute intracranial abnormality. 2. Moderate for age nonspecific cerebral white matter signal changes, most commonly due to chronic small vessel disease. Electronically Signed   By: Genevie Ann M.D.   On: 12/23/2017 16:31    Procedures Procedures (including critical care time)  Medications Ordered in ED Medications - No data to display   Initial Impression / Assessment and Plan / ED Course  I have reviewed the triage vital signs and the nursing notes.  Pertinent labs & imaging results that were available during my care of the patient were reviewed by me and considered in my medical decision making (see chart for details).     CT without acute findings.  Suspect likely Bell's palsy.  Will get MRI to confirm no CVA.  Signed out to oncoming emergency physician.  Final Clinical Impressions(s) / ED Diagnoses   Final diagnoses:  Bell's palsy    ED Discharge Orders        Ordered    predniSONE (DELTASONE) 20 MG tablet     12/23/17 1731    valACYclovir (VALTREX) 1000 MG tablet     12/23/17 1731    artificial tears (LACRILUBE) OINT ophthalmic ointment     12/23/17 1731    hydroxypropyl methylcellulose / hypromellose (ISOPTO TEARS / GONIOVISC) 2.5 % ophthalmic solution     12/23/17 1731       Julianne Rice, MD 12/24/17 [phone]

## 2017-12-27 ENCOUNTER — Telehealth: Payer: Self-pay | Admitting: Nurse Practitioner

## 2017-12-27 NOTE — Telephone Encounter (Signed)
Please advise, pt has an appt with you on Monday 01/02/2018.

## 2017-12-27 NOTE — Telephone Encounter (Signed)
I did not prescribe this

## 2017-12-27 NOTE — Telephone Encounter (Signed)
Pt is aware. appt is set for 12/28/2017.

## 2017-12-27 NOTE — Telephone Encounter (Signed)
CRM for notification. See Telephone encounter for:   12/27/17.   Relation to pt: self Call back number: [phone] Pharmacy: [facility], [place], [place] - [place] [phone] (Phone) [phone] (Fax)    Reason for call:  Patient was seen at [place] 12/23/17 for Bell's palsy, ED Physician prescribed hydroxypropyl methylcellulose / hypromellose (ISOPTO TEARS / GONIOVISC) 2.5 % ophthalmic solution,   patient states she tried several pharmacies and Rx is not in stock. Patient scheduled hospital follow up for Monday 01/02/18 and would like to know if PCP can prescribed alternate, please advise

## 2017-12-28 ENCOUNTER — Ambulatory Visit (INDEPENDENT_AMBULATORY_CARE_PROVIDER_SITE_OTHER): Payer: Medicare Other | Admitting: Nurse Practitioner

## 2017-12-28 ENCOUNTER — Encounter: Payer: Self-pay | Admitting: Nurse Practitioner

## 2017-12-28 VITALS — BP 118/68 | HR 78 | Temp 98.2°F | Ht 65.5 in | Wt 250.0 lb

## 2017-12-28 DIAGNOSIS — I480 Paroxysmal atrial fibrillation: Secondary | ICD-10-CM

## 2017-12-28 DIAGNOSIS — I5021 Acute systolic (congestive) heart failure: Secondary | ICD-10-CM | POA: Diagnosis not present

## 2017-12-28 DIAGNOSIS — G510 Bell's palsy: Secondary | ICD-10-CM | POA: Insufficient documentation

## 2017-12-28 DIAGNOSIS — I2510 Atherosclerotic heart disease of native coronary artery without angina pectoris: Secondary | ICD-10-CM | POA: Diagnosis not present

## 2017-12-28 MED ORDER — CLOPIDOGREL BISULFATE 75 MG PO TABS: 75.0000 mg | ORAL_TABLET | Freq: Every day | ORAL | 0 refills | Status: DC

## 2017-12-28 MED ORDER — LISINOPRIL 5 MG PO TABS: 5.0000 mg | ORAL_TABLET | Freq: Every day | ORAL | 0 refills | Status: DC

## 2017-12-28 MED ORDER — BISOPROLOL FUMARATE 5 MG PO TABS: 5.0000 mg | ORAL_TABLET | Freq: Every day | ORAL | 0 refills | Status: DC

## 2017-12-28 MED ORDER — EYE PATCH MISC: 1.0000 "application " | Freq: Every day | Status: DC

## 2017-12-28 MED ORDER — POLYETHYL GLYCOL-PROPYL GLYCOL 0.4-0.3 % OP GEL: 1.0000 "application " | OPHTHALMIC | 5 refills | Status: DC | PRN

## 2017-12-28 MED ORDER — APIXABAN 5 MG PO TABS: 5.0000 mg | ORAL_TABLET | Freq: Two times a day (BID) | ORAL | 0 refills | Status: DC

## 2017-12-28 MED ORDER — FUROSEMIDE 40 MG PO TABS: 40.0000 mg | ORAL_TABLET | Freq: Every day | ORAL | 0 refills | Status: DC

## 2017-12-28 NOTE — Patient Instructions (Addendum)
Please contact your pharmacy about medication refills.  You have an appt with Dr. Nelly Laurence office 01/13/2018 at [place] will be called to schedule appt for physical therapy.  Use eye patch daily to prevent eye injury.  Proper mouth care and adequate oral hydration is also important.  Facial paralysis can take 3weks to 35months to improve.  Bell Palsy, Adult Bell palsy is a short-term inability to move muscles in part of the face. The inability to move (paralysis) results from inflammation or compression of the facial nerve, which travels along the skull and under the ear to the side of the face (7th cranial nerve). This nerve is responsible for facial movements that include blinking, closing the eyes, smiling, and frowning. What are the causes? The exact cause of this condition is not known. It may be caused by an infection from a virus, such as the chickenpox (herpes zoster), Epstein-Barr, or mumps virus. What increases the risk? You are more likely to develop this condition if:  You are pregnant.  You have diabetes.  You have had a recent infection in your nose, throat, or airways (upper respiratory infection).  You have a weakened body defense system (immune system).  You have had a facial injury, such as a fracture.  You have a family history of Bell palsy.  What are the signs or symptoms? Symptoms of this condition include:  Weakness on one side of the face.  Drooping eyelid and corner of the mouth.  Excessive tearing in one eye.  Difficulty closing the eyelid.  Dry eye.  Drooling.  Dry mouth.  Changes in taste.  Change in facial appearance.  Pain behind one ear.  Ringing in one or both ears.  Sensitivity to sound in one ear.  Facial twitching.  Headache.  Impaired speech.  Dizziness.  Difficulty eating or drinking.  Most of the time, only one side of the face is affected. Rarely, Bell palsy affects the whole face. How is this  diagnosed? This condition is diagnosed based on:  Your symptoms.  Your medical history.  A physical exam.  You may also have to see health care providers who specialize in disorders of the nerves (neurologist) or diseases and conditions of the eye (ophthalmologist). You may have tests, such as:  A test to check for nerve damage (electromyogram).  Imaging studies, such as CT or MRI scans.  Blood tests.  How is this treated? This condition affects every person differently. Sometimes symptoms go away without treatment within a couple weeks. If treatment is needed, it varies from person to person. The goal of treatment is to reduce inflammation and protect the eye from damage. Treatment for Bell palsy may include:  Medicines, such as: ? Steroids to reduce swelling and inflammation. ? Antiviral drugs. ? Pain relievers, including aspirin, acetaminophen, or ibuprofen.  Eye drops or ointment to keep your eye moist.  Eye protection, if you cannot close your eye.  Exercises or massage to regain muscle strength and function (physical therapy).  Follow these instructions at home:  Take over-the-counter and prescription medicines only as told by your health care provider.  If your eye is affected: ? Keep your eye moist with eye drops or ointment as told by your health care provider. ? Follow instructions for eye care and protection as told by your health care provider.  Do any physical therapy exercises as told by your health care provider.  Keep all follow-up visits as told by your health care provider. This is  important. Contact a health care provider if:  You have a fever.  Your symptoms do not get better within 2-3 weeks, or your symptoms get worse.  Your eye is red, irritated, or painful.  You have new symptoms. Get help right away if:  You have weakness or numbness in a part of your body other than your face.  You have trouble swallowing.  You develop neck pain or  stiffness.  You develop dizziness or shortness of breath. Summary  Bell palsy is a short-term inability to move muscles in part of the face. The inability to move (paralysis) results from inflammation or compression of the facial nerve.  This condition affects every person differently. Sometimes symptoms go away without treatment within a couple weeks.  If treatment is needed, it varies from person to person. The goal of treatment is to reduce inflammation and protect the eye from damage.  Contact your health care provider if your symptoms do not get better within 2-3 weeks, or your symptoms get worse. This information is not intended to replace advice given to you by your health care provider. Make sure you discuss any questions you have with your health care provider. Document Released: 11/22/2005 Document Revised: 01/25/2017 Document Reviewed: 01/25/2017 Elsevier Interactive Patient Education  [employer].

## 2017-12-28 NOTE — Progress Notes (Signed)
Subjective:  Patient ID: Pamela Soto, female    DOB: 06-Jul-1951  Age: 67 y.o. MRN: [id number]  CC: Hospitalization Follow-up (hospital follow up/ eyes switching/ meds consutl/eye drop consult:hydroxypropyl methylcellulose / hypromellose (ISOPTO TEARS / GONIOVISC) 2.5 % ophthalmic )  HPI  [name] is here for re eval of left side bell's palsy. Onset 12/22/2016 with headache. She went to sleep, woke up 12/23/2016 with facial asymmetry. She was evaluated for CVA at [facility] ED. CVA was ruled out. Oral prednisone and valtrex was prescribed. She denies any GI bleed. Her pharmacy unable to dispense isopto. She denies any new symptoms, resolved headache.  Reviewed Radiology report and lab results from ED visit.  She also needs medication refilled. Does not have f/up appt with cardiology.  Outpatient Medications Prior to Visit  Medication Sig Dispense Refill  . acetaminophen (TYLENOL) 500 MG tablet Take 1 tablet (500 mg total) by mouth every 6 (six) hours as needed. (Patient taking differently: Take 500 mg by mouth every 6 (six) hours as needed for mild pain or moderate pain. ) 30 tablet 0  . albuterol (PROVENTIL) (2.5 MG/3ML) 0.083% nebulizer solution Take 3 mLs (2.5 mg total) by nebulization every 6 (six) hours as needed for wheezing or shortness of breath. 75 mL 12  . artificial tears (LACRILUBE) OINT ophthalmic ointment Use in left eye at night and tape eye shut 1 Tube 1  . atorvastatin (LIPITOR) 20 MG tablet TAKE ONE TABLET BY MOUTH EVERY DAY 30 tablet 0  . busPIRone (BUSPAR) 7.5 MG tablet Take 1 tablet (7.5 mg total) by mouth every 8 (eight) hours as needed. 90 tablet 0  . Coenzyme Q10 (CO Q-10) 50 MG CAPS Take 1 capsule by mouth 2 (two) times daily after a meal. (Patient taking differently: Take 1 capsule by mouth as needed. ) 60 each 0  . hydroxypropyl methylcellulose / hypromellose (ISOPTO TEARS / GONIOVISC) 2.5 % ophthalmic solution Use in left eye during the day every 2-3 hours as  needed for dry eye 15 mL 12  . Multiple Vitamins-Minerals (MULTIVITAMIN) tablet Take 1 tablet by mouth daily. Petra Kuba made brand 30 tablet   . pantoprazole (PROTONIX) 40 MG tablet 1 po 30 mins prior to first meal 30 tablet 11  . potassium chloride SA (K-DUR,KLOR-CON) 20 MEQ tablet TAKE ONE TABLET BY MOUTH DAILY 90 tablet 1  . predniSONE (DELTASONE) 20 MG tablet Take 3 pills a day for 6 days.  Start tomorrow sat 1/19 18 tablet 0  . tiotropium (SPIRIVA HANDIHALER) 18 MCG inhalation capsule Place 1 capsule (18 mcg total) into inhaler and inhale daily. 30 capsule 12  . valACYclovir (VALTREX) 1000 MG tablet Take one pill tid 21 tablet 0  . bisoprolol (ZEBETA) 5 MG tablet TAKE ONE TABLET BY MOUTH DAILY 90 tablet 1  . clopidogrel (PLAVIX) 75 MG tablet TAKE ONE TABLET BY MOUTH DAILY 90 tablet 1  . ELIQUIS 5 MG TABS tablet TAKE ONE TABLET BY MOUTH TWICE DAILY 60 tablet 0  . furosemide (LASIX) 40 MG tablet TAKE ONE TABLET BY MOUTH DAILY 90 tablet 1  . lisinopril (PRINIVIL,ZESTRIL) 5 MG tablet TAKE ONE TABLET BY MOUTH DAILY 90 tablet 1   No facility-administered medications prior to visit.     ROS See HPI  Objective:  BP 118/68   Pulse 78   Temp 98.2 F (36.8 C)   Ht 5' 5.5" (1.664 m)   Wt 250 lb (113.4 kg)   SpO2 93%   BMI 40.97 kg/m  BP Readings from Last 3 Encounters:  12/28/17 118/68  12/23/17 (!) 141/51  11/17/17 129/68    Wt Readings from Last 3 Encounters:  12/28/17 250 lb (113.4 kg)  12/23/17 255 lb (115.7 kg)  11/17/17 252 lb (114.3 kg)    Physical Exam  Constitutional: She is oriented to person, place, and time.  HENT:  Mouth/Throat: Mucous membranes are normal.  Left facial muscle drooping  Eyes: Conjunctivae and EOM are normal. Pupils are equal, round, and reactive to light.  Ptosis of left upper eyelid Eversion of left lower eyelid.  Neck: Normal range of motion. Neck supple.  Cardiovascular: Normal rate.  Pulmonary/Chest: Effort normal.  Neurological: She is  alert and oriented to person, place, and time. No sensory deficit.  Skin: Skin is warm and dry. No erythema.  Vitals reviewed.   Lab Results  Component Value Date   WBC 7.0 12/23/2017   HGB 13.3 12/23/2017   HCT 39.0 12/23/2017   PLT 241 12/23/2017   GLUCOSE 145 (H) 12/23/2017   CHOL 131 07/13/2017   TRIG 108 07/13/2017   HDL 48 07/13/2017   LDLCALC 61 07/13/2017   ALT 13 (L) 12/23/2017   AST 17 12/23/2017   NA 141 12/23/2017   K 3.8 12/23/2017   CL 101 12/23/2017   CREATININE 1.00 12/23/2017   BUN 6 12/23/2017   CO2 31 12/23/2017   TSH 2.186 07/13/2017   INR 1.22 12/23/2017   HGBA1C 6.6 (H) 07/13/2017    Ct Head Wo Contrast  Result Date: 12/23/2017 CLINICAL DATA:  Headache and left-sided facial droop EXAM: CT HEAD WITHOUT CONTRAST TECHNIQUE: Contiguous axial images were obtained from the base of the skull through the vertex without intravenous contrast. COMPARISON:  None. FINDINGS: Brain: The ventricles are normal in size and configuration. There is slight invagination of CSF into the sella. There is no intracranial mass, hemorrhage, extra-axial fluid collection, or midline shift. There is mild patchy small vessel disease in the centra semiovale bilaterally. Elsewhere gray-white compartments appear normal. No acute infarct is appreciable. Vascular: There is no hyperdense vessel. There is no appreciable vascular calcification. Skull: The bony calvarium appears intact. Sinuses/Orbits: There is slight mucosal thickening in several ethmoid air cells. Other visualized paranasal sinuses are clear. Orbits appear symmetric bilaterally. Other: Visualized mastoid air cells are clear. IMPRESSION: Mild patchy small vessel disease in the centra semiovale. No acute infarct is demonstrable on this study. No mass or hemorrhage. Mild mucosal thickening in several ethmoid air cells. Electronically Signed   By: Lowella Grip III M.D.   On: 12/23/2017 14:40   Mr Brain Wo Contrast  Result Date:  12/23/2017 CLINICAL DATA:  66 year old female with headache and left side weakness today. EXAM: MRI HEAD WITHOUT CONTRAST TECHNIQUE: Multiplanar, multiecho pulse sequences of the brain and surrounding structures were obtained without intravenous contrast. COMPARISON:  Head CT without contrast 1422 hr today. FINDINGS: Brain: No restricted diffusion to suggest acute infarction. No midline shift, mass effect, evidence of mass lesion, ventriculomegaly, extra-axial collection or acute intracranial hemorrhage. Cervicomedullary junction and pituitary are within normal limits. Scattered small bilateral cerebral white matter T2 and FLAIR hyperintense foci, many subcortical, are present in a nonspecific configuration. The extent is moderate for age. No cortical encephalomalacia or chronic cerebral blood products. Deep gray matter nuclei, brainstem, and cerebellum appear normal. Vascular: Major intracranial vascular flow voids are preserved. Skull and upper cervical spine: Negative visualized cervical spine. Hyperostosis of the calvarium, probably a normal variant. Normal marrow signal at the skull  base. Sinuses/Orbits: Orbits soft tissues appear normal. Paranasal sinuses and mastoids are stable and well pneumatized. Other: Visible internal auditory structures appear normal. Scalp and face soft tissues appear negative. IMPRESSION: 1.  No acute intracranial abnormality. 2. Moderate for age nonspecific cerebral white matter signal changes, most commonly due to chronic small vessel disease. Electronically Signed   By: Genevie Ann M.D.   On: 12/23/2017 16:31    Assessment & Plan:   Nakiya was seen today for hospitalization follow-up.  Diagnoses and all orders for this visit:  Left-sided Bell's palsy -     Polyethyl Glycol-Propyl Glycol (SYSTANE) 0.4-0.3 % GEL ophthalmic gel; Place 1 application into both eyes as needed. -     Eye Patch MISC; 1 application by Does not apply route daily. -     Ambulatory referral to Physical  Therapy  Coronary artery disease involving native coronary artery of native heart without angina pectoris -     clopidogrel (PLAVIX) 75 MG tablet; Take 1 tablet (75 mg total) by mouth daily. Additional refills from Dr. Branch(cardiology) -     furosemide (LASIX) 40 MG tablet; Take 1 tablet (40 mg total) by mouth daily. Additional refills from Dr. Branch(cardiology) -     lisinopril (PRINIVIL,ZESTRIL) 5 MG tablet; Take 1 tablet (5 mg total) by mouth daily. Additional refills from Dr. Branch(cardiology) -     bisoprolol (ZEBETA) 5 MG tablet; Take 1 tablet (5 mg total) by mouth daily. Additional refills from Dr. Branch(cardiology)  Paroxysmal atrial fibrillation (HCC) -     apixaban (ELIQUIS) 5 MG TABS tablet; Take 1 tablet (5 mg total) by mouth 2 (two) times daily. Additional refills from Dr. Branch(cardiology) -     bisoprolol (ZEBETA) 5 MG tablet; Take 1 tablet (5 mg total) by mouth daily. Additional refills from Dr. Branch(cardiology)  Acute systolic heart failure (HCC) -     furosemide (LASIX) 40 MG tablet; Take 1 tablet (40 mg total) by mouth daily. Additional refills from Dr. Branch(cardiology) -     lisinopril (PRINIVIL,ZESTRIL) 5 MG tablet; Take 1 tablet (5 mg total) by mouth daily. Additional refills from Dr. Branch(cardiology) -     bisoprolol (ZEBETA) 5 MG tablet; Take 1 tablet (5 mg total) by mouth daily. Additional refills from Dr. Branch(cardiology)   I have changed Rosezella Rumpf ELIQUIS to apixaban. I have also changed her clopidogrel, furosemide, lisinopril, and bisoprolol. I am also having her start on Polyethyl Glycol-Propyl Glycol and Eye Patch. Additionally, I am having her maintain her albuterol, acetaminophen, Co Q-10, tiotropium, pantoprazole, busPIRone, multivitamin, potassium chloride SA, atorvastatin, predniSONE, valACYclovir, artificial tears, and hydroxypropyl methylcellulose / hypromellose.  Meds ordered this encounter  Medications  . Polyethyl Glycol-Propyl  Glycol (SYSTANE) 0.4-0.3 % GEL ophthalmic gel    Sig: Place 1 application into both eyes as needed.    Dispense:  10 mL    Refill:  5    Order Specific Question:   Supervising Provider    Answer:   Lucille Passy [[id number]]  . Eye Patch MISC    Sig: 1 application by Does not apply route daily.    Order Specific Question:   Supervising Provider    Answer:   Lucille Passy [[id number]]  . clopidogrel (PLAVIX) 75 MG tablet    Sig: Take 1 tablet (75 mg total) by mouth daily. Additional refills from Dr. Branch(cardiology)    Dispense:  30 tablet    Refill:  0    Additional refills from Dr. Branch(cardiology)  Order Specific Question:   Supervising Provider    Answer:   Lucille Passy [[id number]]  . apixaban (ELIQUIS) 5 MG TABS tablet    Sig: Take 1 tablet (5 mg total) by mouth 2 (two) times daily. Additional refills from Dr. Branch(cardiology)    Dispense:  60 tablet    Refill:  0    Additional refills from Dr. Branch(cardiology)    Order Specific Question:   Supervising Provider    Answer:   Lucille Passy [[id number]]  . furosemide (LASIX) 40 MG tablet    Sig: Take 1 tablet (40 mg total) by mouth daily. Additional refills from Dr. Branch(cardiology)    Dispense:  30 tablet    Refill:  0    Additional refills from Dr. Branch(cardiology)    Order Specific Question:   Supervising Provider    Answer:   Lucille Passy [[id number]]  . lisinopril (PRINIVIL,ZESTRIL) 5 MG tablet    Sig: Take 1 tablet (5 mg total) by mouth daily. Additional refills from Dr. Branch(cardiology)    Dispense:  30 tablet    Refill:  0    Additional refills from Dr. Branch(cardiology)    Order Specific Question:   Supervising Provider    Answer:   Lucille Passy [[id number]]  . bisoprolol (ZEBETA) 5 MG tablet    Sig: Take 1 tablet (5 mg total) by mouth daily. Additional refills from Dr. Branch(cardiology)    Dispense:  30 tablet    Refill:  0    Additional refills from Dr. Branch(cardiology)    Order Specific Question:   Supervising Provider     Answer:   Lucille Passy [[id number]]    Follow-up: Return if symptoms worsen or fail to improve.  Wilfred Lacy, NP

## 2017-12-29 ENCOUNTER — Other Ambulatory Visit: Payer: Self-pay

## 2017-12-29 ENCOUNTER — Ambulatory Visit (HOSPITAL_COMMUNITY): Payer: Medicare Other | Attending: Nurse Practitioner | Admitting: Physical Therapy

## 2017-12-29 DIAGNOSIS — M6281 Muscle weakness (generalized): Secondary | ICD-10-CM | POA: Diagnosis not present

## 2017-12-29 DIAGNOSIS — G510 Bell's palsy: Secondary | ICD-10-CM

## 2017-12-29 NOTE — Therapy (Signed)
[facility] [address] [place], [place], [address] Phone: [phone]   Fax:  [phone]  Physical Therapy Evaluation  Patient Details  Name: Pamela Soto MRN: [id number] Date of Birth: 10/07/1951 Referring Provider: Wilfred Lacy   Encounter Date: 12/29/2017  PT End of Session - 12/29/17 1038    Visit Number  1    Number of Visits  1    Date for PT Re-Evaluation  01/28/18    Authorization Type  medicare    Authorization - Number of Visits  1    PT Start Time  0910    PT Stop Time  0945    PT Time Calculation (min)  35 min    Activity Tolerance  Patient tolerated treatment well    Behavior During Therapy  [facility] for tasks assessed/performed       Past Medical History:  Diagnosis Date  . Acid reflux   . Hypertension   . Sleep apnea     Past Surgical History:  Procedure Laterality Date  . BIOPSY  09/13/2017   Procedure: BIOPSY;  Surgeon: Danie Binder, MD;  Location: [facility];  Service: Endoscopy;;  colon duodenum gastric  . BREAST BIOPSY Right    normal result  . CESAREAN SECTION    . CHOLECYSTECTOMY  11/06/2012   Procedure: LAPAROSCOPIC CHOLECYSTECTOMY WITH INTRAOPERATIVE CHOLANGIOGRAM;  Surgeon: Pedro Earls, MD;  Location: [facility];  Service: General;  Laterality: N/A;  . COLONOSCOPY N/A 09/13/2017   Procedure: COLONOSCOPY;  Surgeon: Danie Binder, MD;  Location: [facility];  Service: Endoscopy;  Laterality: N/A;  245  . ESOPHAGOGASTRODUODENOSCOPY N/A 09/13/2017   Procedure: ESOPHAGOGASTRODUODENOSCOPY (EGD);  Surgeon: Danie Binder, MD;  Location: [facility];  Service: Endoscopy;  Laterality: N/A;  . POLYPECTOMY  09/13/2017   Procedure: POLYPECTOMY;  Surgeon: Danie Binder, MD;  Location: [facility];  Service: Endoscopy;;  colon  . RIGHT/LEFT HEART CATH AND CORONARY ANGIOGRAPHY N/A 01/10/2017   Procedure: Right/Left Heart Cath and Coronary Angiography;  Surgeon: Belva Crome, MD;  Location: [place] CV LAB;  Service: Cardiovascular;  Laterality: N/A;  . UMBILICAL HERNIA REPAIR  11/06/2012   Procedure: HERNIA REPAIR UMBILICAL ADULT;  Surgeon: Pedro Earls, MD;  Location: [facility];  Service: General;;    There were no vitals filed for this visit.   Subjective Assessment - 12/29/17 0919    Subjective  Pamela Soto states that when she woke up Friday and noted that she could not move the right side of her face.  She was not having any weakness in her arm or leg.  She went to the ER testing was negative for stroke and she is now being referred to skilled PT for Bells Palsy     Pertinent History  COPD     How long can you sit comfortably?  no problem    How long can you stand comfortably?  No problem     How long can you walk comfortably?  no problem     Patient Stated Goals  Face to move     Currently in Pain?  No/denies         [facility] PT Assessment - 12/29/17 0001      Assessment   Medical Diagnosis  Bells Palsy     Referring Provider  Charlotte Nche    Onset Date/Surgical Date  12/23/17    Next MD Visit  no appointment scheduled  Prior Therapy  none      Precautions   Precautions  None      Restrictions   Weight Bearing Restrictions  No      Balance Screen   Has the patient fallen in the past 6 months  No    Has the patient had a decrease in activity level because of a fear of falling?   No    Is the patient reluctant to leave their home because of a fear of falling?   No      Home Film/video editor residence      Prior Function   Level of Independence  Independent      Cognition   Overall Cognitive Status  Within Functional Limits for tasks assessed      Observation/Other Assessments   Observations  Noted drooping of right facial mm.              Objective measurements completed on examination: See above findings.              PT Education - 12/29/17 1037    Education provided  Yes    Education Details  Pt  given facial and oral exercises to be completed every day until issue resolves    Person(s) Educated  Patient    Methods  Explanation;Demonstration;Handout    Comprehension  Verbalized understanding;Returned demonstration       PT Short Term Goals - 12/29/17 1046      PT SHORT TERM GOAL #1   Title  Pt to be I in facial and oral exercises including AA to keep proper alignment.     Time  1    Period  Days    Status  Achieved                Plan - 12/29/17 1039    Clinical Impression Statement  Pamela Soto is a 67 yo who had noted facial droop when she woke up on 12/23/2017.  She went to the ER was cleared for a CVA and was diagnosed with Bell's Palsy.  She followed up with her primary MD who has referred her to skilled physical therapy.  Ms. Ketner has obvious paralysis of her Lt side of her face.  She was educated on both facial and oral exercises and instructed to continue to complete these exercises on a daily basis.      Clinical Presentation  Stable    Clinical Presentation due to:  facial paraysis     Clinical Decision Making  Low    Rehab Potential  Good    PT Frequency  1x / week    PT Duration  -- 1    PT Treatment/Interventions  Patient/family education;Therapeutic exercise    PT Next Visit Plan  Pt was seen for a one time treatment for facial and oral exercises.         Patient will benefit from skilled therapeutic intervention in order to improve the following deficits and impairments:  Decreased strength  Visit Diagnosis: Left-sided Bell's palsy - Plan: PT plan of care cert/re-cert  Muscle weakness (generalized) - Plan: PT plan of care cert/re-cert     Problem List Patient Active Problem List   Diagnosis Date Noted  . Left-sided Bell's palsy 12/28/2017  . Helicobacter pylori gastritis 11/17/2017  . Chronic fatigue 09/26/2017  . Anxiety about health 09/26/2017  . Sleep apnea 09/22/2017  . Polyp of colon   . Abdominal pain, chronic, epigastric   .  Diarrhea 08/31/2017  . Dysphagia 08/31/2017  . Paroxysmal atrial fibrillation (HCC)   . Coronary artery disease involving native coronary artery of native heart   . Impaired glucose tolerance 01/07/2017  . Acute systolic heart failure ([place]) 01/06/2017  . Acute respiratory failure with hypoxia ([place]) 01/05/2017  . Hyperglycemia 01/05/2017  . Acute exacerbation of chronic obstructive pulmonary disease (COPD) ([place])   . COPD exacerbation ([place]) 01/04/2017  . S/P laparoscopic cholecystectomy + IOC Dec 2013 11/06/2012  . GERD (gastroesophageal reflux disease) 06/05/2008  . UTI'S, HX OF 06/05/2008  . CHEST PAIN, HX OF 05/30/2008   Rayetta Humphrey, PT CLT [phone] 12/29/2017, 10:50 AM  [place], [place], [address] Phone: [phone]   Fax:  [phone]  Name: TANVEER DOBBERSTEIN MRN: [id number] Date of Birth: 1951-01-13

## 2018-01-02 ENCOUNTER — Inpatient Hospital Stay: Payer: Medicare Other | Admitting: Nurse Practitioner

## 2018-01-10 ENCOUNTER — Ambulatory Visit (HOSPITAL_COMMUNITY): Payer: Medicare Other | Admitting: Physical Therapy

## 2018-01-13 ENCOUNTER — Ambulatory Visit (INDEPENDENT_AMBULATORY_CARE_PROVIDER_SITE_OTHER): Payer: Medicare Other | Admitting: Cardiology

## 2018-01-13 ENCOUNTER — Encounter: Payer: Self-pay | Admitting: Cardiology

## 2018-01-13 VITALS — BP 116/72 | HR 68 | Ht 65.0 in | Wt 248.8 lb

## 2018-01-13 DIAGNOSIS — I5022 Chronic systolic (congestive) heart failure: Secondary | ICD-10-CM

## 2018-01-13 DIAGNOSIS — E782 Mixed hyperlipidemia: Secondary | ICD-10-CM | POA: Diagnosis not present

## 2018-01-13 DIAGNOSIS — I4891 Unspecified atrial fibrillation: Secondary | ICD-10-CM | POA: Diagnosis not present

## 2018-01-13 DIAGNOSIS — R609 Edema, unspecified: Secondary | ICD-10-CM | POA: Diagnosis not present

## 2018-01-13 DIAGNOSIS — I2510 Atherosclerotic heart disease of native coronary artery without angina pectoris: Secondary | ICD-10-CM | POA: Diagnosis not present

## 2018-01-13 MED ORDER — FUROSEMIDE 40 MG PO TABS: 40.0000 mg | ORAL_TABLET | Freq: Every day | ORAL | 3 refills | Status: DC | PRN

## 2018-01-13 NOTE — Patient Instructions (Signed)
Medication Instructions:   Your physician has recommended you make the following change in your medication:   Change furosemide to 40 mg by mouth daily as needed for swelling.  Stop plavix.  Continue all other medications the same.  Labwork:  NONE  Testing/Procedures:  NONE  Follow-Up:  Your physician recommends that you schedule a follow-up appointment in: 6 months. You will receive a reminder letter in the mail in about 4 months reminding you to call and schedule your appointment. If you don't receive this letter, please contact our office.  Any Other Special Instructions Will Be Listed Below (If Applicable).  If you need a refill on your cardiac medications before your next appointment, please call your pharmacy.

## 2018-01-13 NOTE — Progress Notes (Signed)
Clinical Summary Pamela Soto is a 67 y.o.female seen today for follow up of the following medical problems.   1. NICM/Chronic systolic HF/NSTEMI - admit Jan 2018 with CHF - echo at that time showed LVEF 35-40%, a new finding for the patient.  - referred for cath. Feb 2018 cath: distal LAD100%, Prox RCA 40%, OM3 40%. RHC mean PA 37, PCWP 26, CI 1.73. CAD medically managed.  - repeat echo 07/2017: LVEF 50-55%, abnormal diastolic function   - no recent SOB/DOE. No recent edema - compliant with meds.   2. COPD - 03/2017 PFTs: severe COPD - followed by Dr Luan Pulling   3. Afib - no recent palpitations - no bleeding on eliquis.   4. Hyperlipidemia - reports nonspecific neck and shoulder pains, unclear if related to statin.  - somewhat improved with lower statin dosing - 07/2017 TC 131 TG 108 HDL 48 LDL 61 - atorvastatin recently increased to 40mg  daily. Since that time has had some stomach upset for few week, constiptation alternating with diarrhea.   - tolerating atorva 20mg  daily   5. DM2 - followed by pcp   6. OSA - sleep study 03/2017 with severe OSA. On bipap, followed by Dr Luan Pulling   Past Medical History:  Diagnosis Date  . Acid reflux   . Hypertension   . Sleep apnea      No Known Allergies   Current Outpatient Medications  Medication Sig Dispense Refill  . acetaminophen (TYLENOL) 500 MG tablet Take 1 tablet (500 mg total) by mouth every 6 (six) hours as needed. (Patient taking differently: Take 500 mg by mouth every 6 (six) hours as needed for mild pain or moderate pain. ) 30 tablet 0  . albuterol (PROVENTIL) (2.5 MG/3ML) 0.083% nebulizer solution Take 3 mLs (2.5 mg total) by nebulization every 6 (six) hours as needed for wheezing or shortness of breath. 75 mL 12  . apixaban (ELIQUIS) 5 MG TABS tablet Take 1 tablet (5 mg total) by mouth 2 (two) times daily. Additional refills from Dr. Thayden Lemire(cardiology) 60 tablet 0  . artificial tears (LACRILUBE)  OINT ophthalmic ointment Use in left eye at night and tape eye shut 1 Tube 1  . atorvastatin (LIPITOR) 20 MG tablet TAKE ONE TABLET BY MOUTH EVERY DAY 30 tablet 0  . bisoprolol (ZEBETA) 5 MG tablet Take 1 tablet (5 mg total) by mouth daily. Additional refills from Dr. Briant Angelillo(cardiology) 30 tablet 0  . busPIRone (BUSPAR) 7.5 MG tablet Take 1 tablet (7.5 mg total) by mouth every 8 (eight) hours as needed. 90 tablet 0  . clopidogrel (PLAVIX) 75 MG tablet Take 1 tablet (75 mg total) by mouth daily. Additional refills from Dr. Daena Alper(cardiology) 30 tablet 0  . Coenzyme Q10 (CO Q-10) 50 MG CAPS Take 1 capsule by mouth 2 (two) times daily after a meal. (Patient taking differently: Take 1 capsule by mouth as needed. ) 60 each 0  . Eye Patch MISC 1 application by Does not apply route daily.    . furosemide (LASIX) 40 MG tablet Take 1 tablet (40 mg total) by mouth daily. Additional refills from Dr. Tashunda Vandezande(cardiology) 30 tablet 0  . hydroxypropyl methylcellulose / hypromellose (ISOPTO TEARS / GONIOVISC) 2.5 % ophthalmic solution Use in left eye during the day every 2-3 hours as needed for dry eye 15 mL 12  . lisinopril (PRINIVIL,ZESTRIL) 5 MG tablet Take 1 tablet (5 mg total) by mouth daily. Additional refills from Dr. Layza Summa(cardiology) 30 tablet 0  . Multiple Vitamins-Minerals (  MULTIVITAMIN) tablet Take 1 tablet by mouth daily. Petra Kuba made brand 30 tablet   . pantoprazole (PROTONIX) 40 MG tablet 1 po 30 mins prior to first meal 30 tablet 11  . Polyethyl Glycol-Propyl Glycol (SYSTANE) 0.4-0.3 % GEL ophthalmic gel Place 1 application into both eyes as needed. 10 mL 5  . potassium chloride SA (K-DUR,KLOR-CON) 20 MEQ tablet TAKE ONE TABLET BY MOUTH DAILY 90 tablet 1  . predniSONE (DELTASONE) 20 MG tablet Take 3 pills a day for 6 days.  Start tomorrow sat 1/19 18 tablet 0  . tiotropium (SPIRIVA HANDIHALER) 18 MCG inhalation capsule Place 1 capsule (18 mcg total) into inhaler and inhale daily. 30 capsule 12  .  valACYclovir (VALTREX) 1000 MG tablet Take one pill tid 21 tablet 0   No current facility-administered medications for this visit.      Past Surgical History:  Procedure Laterality Date  . BIOPSY  09/13/2017   Procedure: BIOPSY;  Surgeon: Danie Binder, MD;  [facility]: [facility];  Service: Endoscopy;;  colon duodenum gastric  . BREAST BIOPSY Right    normal result  . CESAREAN SECTION    . CHOLECYSTECTOMY  11/06/2012   Procedure: LAPAROSCOPIC CHOLECYSTECTOMY WITH INTRAOPERATIVE CHOLANGIOGRAM;  Surgeon: Pedro Earls, MD;  Location: [facility];  Service: General;  Laterality: N/A;  . COLONOSCOPY N/A 09/13/2017   Procedure: COLONOSCOPY;  Surgeon: Danie Binder, MD;  [facility];  Service: Endoscopy;  Laterality: N/A;  245  . ESOPHAGOGASTRODUODENOSCOPY N/A 09/13/2017   Procedure: ESOPHAGOGASTRODUODENOSCOPY (EGD);  Surgeon: Danie Binder, MD;  [facility];  Service: Endoscopy;  Laterality: N/A;  . POLYPECTOMY  09/13/2017   Procedure: POLYPECTOMY;  Surgeon: Danie Binder, MD;  [facility];  Service: Endoscopy;;  colon  . RIGHT/LEFT HEART CATH AND CORONARY ANGIOGRAPHY N/A 01/10/2017   Procedure: Right/Left Heart Cath and Coronary Angiography;  Surgeon: Belva Crome, MD;  Location: [facility];  Service: Cardiovascular;  Laterality: N/A;  . UMBILICAL HERNIA REPAIR  11/06/2012   Procedure: HERNIA REPAIR UMBILICAL ADULT;  Surgeon: Pedro Earls, MD;  Location: [facility];  Service: General;;     No Known Allergies    Family History  Problem Relation Age of Onset  . Heart disease Mother 51  . Heart attack Mother   . Asthma Father   . Cancer Father        pancreastic cancer  . Heart attack Brother 46  . Cancer Sister        unsure  . Colon cancer Neg Hx      Social History Pamela Soto reports that  has never smoked. she has never used smokeless tobacco. Pamela Soto reports that she does not drink alcohol.   Review of  Systems CONSTITUTIONAL: No weight loss, fever, chills, weakness or fatigue.  HEENT: Eyes: No visual loss, blurred vision, double vision or yellow sclerae.No hearing loss, sneezing, congestion, runny nose or sore throat.  SKIN: No rash or itching.  CARDIOVASCULAR: per hpi RESPIRATORY: No shortness of breath, cough or sputum.  GASTROINTESTINAL: No anorexia, nausea, vomiting or diarrhea. No abdominal pain or blood.  GENITOURINARY: No burning on urination, no polyuria NEUROLOGICAL: No headache, dizziness, syncope, paralysis, ataxia, numbness or tingling in the extremities. No change in bowel or bladder control.  MUSCULOSKELETAL: No muscle, back pain, joint pain or stiffness.  LYMPHATICS: No enlarged nodes. No history of splenectomy.  PSYCHIATRIC: No history of depression or anxiety.  ENDOCRINOLOGIC: No reports of  sweating, cold or heat intolerance. No polyuria or polydipsia.  [name]   Physical Examination Vitals:   01/13/18 1400  BP: 116/72  Pulse: 68  SpO2: 93%   Vitals:   01/13/18 1400  Weight: 248 lb 12.8 oz (112.9 kg)  Height: 5\' 5"  (1.651 m)    Gen: resting comfortably, no acute distress HEENT: no scleral icterus, pupils equal round and reactive, no palptable cervical adenopathy,  CV: RRR, no m/r/g, no jvd Resp: Clear to auscultation bilaterally GI: abdomen is soft, non-tender, non-distended, normal bowel sounds, no hepatosplenomegaly MSK: extremities are warm, no edema.  Skin: warm, no rash Neuro:  no focal deficits Psych: appropriate affect   Diagnostic Studies 01/2017 cath  Dist LAD lesion, 100 %stenosed.  Prox RCA lesion, 40 %stenosed.  3rd Mrg lesion, 40 %stenosed.  There is severe left ventricular systolic dysfunction.  The left ventricular ejection fraction is less than 25% by visual estimate.  LV end diastolic pressure is severely elevated.  Hemodynamic findings consistent with moderate pulmonary hypertension.   Acute atrial fibrillation developed on the  cath table during right heart/right atrial pressure recording. Rate 118 bpm. Atrial fibrillation was treated with one bolus of IV amiodarone.  40% proximal RCA stenosis.  Tortuous but normal circumflex  100% distal/apical LAD stenosis. This lesion is probably chronic but difficult to determine age. There is also a possibility that it is acute embolic occlusion from a catheter-based thrombus. For that reason additional heparin and then Aggrastat bolus was given as the patient complained of chest pressure post procedure.  Chest pressure post procedure is similar to prior episodes.  Severe LV dysfunction with EF less than 25%. Contraction pattern appears global.  Arterial blood gas demonstrated severe CO2 retention, pCO2 67.    01/2017 echo Study Conclusions  - Left ventricle: The cavity size was mildly dilated. Wall thickness was increased in a pattern of moderate LVH. Systolic function was moderately reduced. The estimated ejection fraction was in the range of 35% to 40%. Diffuse hypokinesis. - Aortic valve: Mildly calcified annulus. Trileaflet; mildly thickened leaflets. - Mitral valve: There was mild regurgitation. - Left atrium: The atrium was moderately dilated. - Atrial septum: The septum bowed from left to right, consistent with increased left atrial pressure. - Pulmonic valve: There was mild regurgitation.   07/2017 echo Study Conclusions  - Left ventricle: The cavity size was normal. Wall thickness was increased increased in a pattern of mild to moderate LVH. Systolic function was normal. The estimated ejection fraction was in the range of 50% to 55%. Diastolic function is abnormal, indeterminant grade. LVOT VTI and Vmax likely underestimated based on angle of acquisition. - Aortic valve: Valve area (VTI): 1.56 cm^2. Valve area (Vmax): 1.84 cm^2. - Technically difficult study.    Assessment and Plan  1. CAD/Chronic systolic HF - no  recent symptoms, continue current meds - stop plavix, has completed 1 year since her medically managed NSTEMI - change lasix to prn only   2. Afib -  CHADS2Vasc score is 5, she will continue eliquis - no symptoms, continue current meds  3. Hyperlipidemia - continue low dose statin. Appears her GI symptoms may not have been related to statin, consider high doses at f/u . 4. Diarrhea - resolved, followed by GI      Pamela Soto, M.D.

## 2018-01-18 ENCOUNTER — Encounter: Payer: Self-pay | Admitting: Cardiology

## 2018-02-01 ENCOUNTER — Other Ambulatory Visit: Payer: Self-pay | Admitting: *Deleted

## 2018-02-01 DIAGNOSIS — I480 Paroxysmal atrial fibrillation: Secondary | ICD-10-CM

## 2018-02-01 MED ORDER — APIXABAN 5 MG PO TABS: 5.0000 mg | ORAL_TABLET | Freq: Two times a day (BID) | ORAL | 6 refills | Status: DC

## 2018-02-02 ENCOUNTER — Other Ambulatory Visit: Payer: Self-pay | Admitting: *Deleted

## 2018-02-02 DIAGNOSIS — I2510 Atherosclerotic heart disease of native coronary artery without angina pectoris: Secondary | ICD-10-CM

## 2018-02-02 DIAGNOSIS — I5021 Acute systolic (congestive) heart failure: Secondary | ICD-10-CM

## 2018-02-02 DIAGNOSIS — I480 Paroxysmal atrial fibrillation: Secondary | ICD-10-CM

## 2018-02-02 MED ORDER — BISOPROLOL FUMARATE 5 MG PO TABS: 5.0000 mg | ORAL_TABLET | Freq: Every day | ORAL | 1 refills | Status: DC

## 2018-02-07 ENCOUNTER — Telehealth: Payer: Self-pay | Admitting: *Deleted

## 2018-02-07 NOTE — Telephone Encounter (Signed)
PA approved for Bisoprolol 5 mg cover my meds [facility] [id number]

## 2018-02-11 ENCOUNTER — Other Ambulatory Visit: Payer: Self-pay | Admitting: Cardiology

## 2018-02-11 DIAGNOSIS — I2510 Atherosclerotic heart disease of native coronary artery without angina pectoris: Secondary | ICD-10-CM

## 2018-02-11 DIAGNOSIS — I5021 Acute systolic (congestive) heart failure: Secondary | ICD-10-CM

## 2018-04-15 ENCOUNTER — Other Ambulatory Visit: Payer: Self-pay | Admitting: Cardiology

## 2018-04-21 DIAGNOSIS — I10 Essential (primary) hypertension: Secondary | ICD-10-CM | POA: Diagnosis not present

## 2018-04-21 DIAGNOSIS — K219 Gastro-esophageal reflux disease without esophagitis: Secondary | ICD-10-CM | POA: Diagnosis not present

## 2018-04-21 DIAGNOSIS — I2510 Atherosclerotic heart disease of native coronary artery without angina pectoris: Secondary | ICD-10-CM | POA: Diagnosis not present

## 2018-04-21 DIAGNOSIS — Z299 Encounter for prophylactic measures, unspecified: Secondary | ICD-10-CM | POA: Diagnosis not present

## 2018-04-21 DIAGNOSIS — Z6841 Body Mass Index (BMI) 40.0 and over, adult: Secondary | ICD-10-CM | POA: Diagnosis not present

## 2018-04-21 DIAGNOSIS — J449 Chronic obstructive pulmonary disease, unspecified: Secondary | ICD-10-CM | POA: Diagnosis not present

## 2018-04-26 ENCOUNTER — Other Ambulatory Visit: Payer: Self-pay | Admitting: Cardiology

## 2018-05-04 ENCOUNTER — Other Ambulatory Visit: Payer: Self-pay | Admitting: Cardiology

## 2018-05-04 DIAGNOSIS — I5021 Acute systolic (congestive) heart failure: Secondary | ICD-10-CM

## 2018-05-04 DIAGNOSIS — I2510 Atherosclerotic heart disease of native coronary artery without angina pectoris: Secondary | ICD-10-CM

## 2018-05-04 DIAGNOSIS — I480 Paroxysmal atrial fibrillation: Secondary | ICD-10-CM

## 2018-05-17 ENCOUNTER — Ambulatory Visit (INDEPENDENT_AMBULATORY_CARE_PROVIDER_SITE_OTHER): Payer: Medicare Other | Admitting: Nurse Practitioner

## 2018-05-17 ENCOUNTER — Encounter: Payer: Self-pay | Admitting: Nurse Practitioner

## 2018-05-17 VITALS — BP 180/87 | HR 58 | Temp 97.4°F | Ht 65.5 in | Wt 254.4 lb

## 2018-05-17 DIAGNOSIS — I2510 Atherosclerotic heart disease of native coronary artery without angina pectoris: Secondary | ICD-10-CM

## 2018-05-17 DIAGNOSIS — R1310 Dysphagia, unspecified: Secondary | ICD-10-CM | POA: Diagnosis not present

## 2018-05-17 DIAGNOSIS — K219 Gastro-esophageal reflux disease without esophagitis: Secondary | ICD-10-CM | POA: Diagnosis not present

## 2018-05-17 DIAGNOSIS — K2970 Gastritis, unspecified, without bleeding: Secondary | ICD-10-CM | POA: Diagnosis not present

## 2018-05-17 DIAGNOSIS — R1319 Other dysphagia: Secondary | ICD-10-CM

## 2018-05-17 DIAGNOSIS — B9681 Helicobacter pylori [H. pylori] as the cause of diseases classified elsewhere: Secondary | ICD-10-CM

## 2018-05-17 NOTE — Patient Instructions (Signed)
1. Continue your current medications. 2. Return for follow-up in 1 year. 3. Call us if you have any worsening symptoms, returning symptoms, questions, or concerns.  At [facility] we value your feedback. You may receive a survey about your visit today. Please share your experience as we strive to create trusting relationships with our patients to provide genuine, compassionate, quality care.  It was great to see you today!  I hope you have a wonderful summer!!

## 2018-05-17 NOTE — Progress Notes (Signed)
CC'D TO PCP °

## 2018-05-17 NOTE — Assessment & Plan Note (Signed)
No further dysphasia.  Continue to monitor for any worsening symptoms and notify us of any.  Follow-up in 1 year.

## 2018-05-17 NOTE — Assessment & Plan Note (Signed)
Previously had H. pylori gastritis based on EGD biopsy.  Eradication confirmed after treatment via H. pylori breath testing.  This likely explain her exacerbation symptoms.  Continue to monitor for any changes in symptoms.  Follow-up in 1 year.

## 2018-05-17 NOTE — Assessment & Plan Note (Signed)
GERD symptoms significantly improved after treatment of H. pylori.  Is now on Protonix once daily and doing well.  No breakthrough symptoms.  Continue to monitor, follow-up in 1 year.

## 2018-05-17 NOTE — Progress Notes (Signed)
Referring Provider: Flossie Buffy, NP Primary Care Physician:  Flossie Buffy, NP Primary GI:  Dr. Oneida Alar  Chief Complaint  Patient presents with  . Gastroesophageal Reflux    f/u.  [name] Dysphagia    HPI:   Pamela Soto is a 67 y.o. female who presents for follow-up on GERD and dysphagia. The patient was last seen in our office 12/13/118 for the same as well as hx of H. Pylori gastritis. TCS up to dat 09/13/2017 which found a mix of benign, hyperplastic, and tubular adenoma polyps and recommended repeat exam 5-7 years.  EGD completed the same day found dysphasia due to GERD and possible web in the proximal esophagus status post dilation, moderate gastritis and duodenitis status post gastric and duodenal biopsies.  Surgical pathology found the biopsies to be chronic active gastritis with H. pylori, no blunting suggestive of celiac disease.  Recommended Protonix 40 mg indefinitely.  She was treated for H. pylori with amoxicillin/Biaxin/PPI.  At her last visit she was doing well, no ongoing GERD symptoms, still on PPI but she was taking it twice a day rather than once a day.  No dysphasia.  Rare abdominal pain due to dietary indiscretions.  No vomiting.  No other GI symptoms.  Recommended H. pylori breath test to check for eradication, stop Protonix for 2 weeks prior to the test, otherwise continue current medications and restart Protonix after breath testing.  Follow-up in 6 months.  H. pylori breath test completed 12/08/2017 which confirmed eradication.  Today she states she's doing well overall. Denies ongoing GERD, N/V, hematochezia, melena. Has some rare abdominal pain she attributes to constipation, not taking anything for it. Denies hematochezia, melena, fever, chills, unintentional weight loss. No further dysphagia symptoms. Denies chest pain, dyspnea, dizziness, lightheadedness, syncope, near syncope. Denies any other upper or lower GI symptoms.  Is on Protonix once  daily.  Past Medical History:  Diagnosis Date  . Acid reflux   . Hypertension   . Sleep apnea     Past Surgical History:  Procedure Laterality Date  . BIOPSY  09/13/2017   Procedure: BIOPSY;  Surgeon: Danie Binder, MD;  [facility];  Service: Endoscopy;;  colon duodenum gastric  . BREAST BIOPSY Right    normal result  . CESAREAN SECTION    . CHOLECYSTECTOMY  11/06/2012   Procedure: LAPAROSCOPIC CHOLECYSTECTOMY WITH INTRAOPERATIVE CHOLANGIOGRAM;  Surgeon: Pedro Earls, MD;  [facility]: [facility];  Service: General;  Laterality: N/A;  . COLONOSCOPY N/A 09/13/2017   Procedure: COLONOSCOPY;  Surgeon: Danie Binder, MD;  [facility];  Service: Endoscopy;  Laterality: N/A;  245  . ESOPHAGOGASTRODUODENOSCOPY N/A 09/13/2017   Procedure: ESOPHAGOGASTRODUODENOSCOPY (EGD);  Surgeon: Danie Binder, MD;  [facility];  Service: Endoscopy;  Laterality: N/A;  . POLYPECTOMY  09/13/2017   Procedure: POLYPECTOMY;  Surgeon: Danie Binder, MD;  [facility];  Service: Endoscopy;;  colon  . RIGHT/LEFT HEART CATH AND CORONARY ANGIOGRAPHY N/A 01/10/2017   Procedure: Right/Left Heart Cath and Coronary Angiography;  Surgeon: Belva Crome, MD;  Location: [facility];  Service: Cardiovascular;  Laterality: N/A;  . UMBILICAL HERNIA REPAIR  11/06/2012   Procedure: HERNIA REPAIR UMBILICAL ADULT;  Surgeon: Pedro Earls, MD;  [facility]: [facility];  Service: General;;    Current Outpatient Medications  Medication Sig Dispense Refill  . acetaminophen (TYLENOL) 500 MG tablet Take 1 tablet (500 mg total) by mouth every 6 (  six) hours as needed. (Patient taking differently: Take 500 mg by mouth every 6 (six) hours as needed for mild pain or moderate pain. ) 30 tablet 0  . albuterol (PROVENTIL) (2.5 MG/3ML) 0.083% nebulizer solution Take 3 mLs (2.5 mg total) by nebulization every 6 (six) hours as needed for wheezing or shortness of breath. 75 mL 12  . apixaban  (ELIQUIS) 5 MG TABS tablet Take 1 tablet (5 mg total) by mouth 2 (two) times daily. Additional refills from Dr. Branch(cardiology) 60 tablet 6  . artificial tears (LACRILUBE) OINT ophthalmic ointment Use in left eye at night and tape eye shut 1 Tube 1  . atorvastatin (LIPITOR) 20 MG tablet TAKE ONE TABLET BY MOUTH EVERY DAY 30 tablet 6  . bisoprolol (ZEBETA) 5 MG tablet TAKE ONE TABLET BY MOUTH DAILY 90 tablet 1  . busPIRone (BUSPAR) 7.5 MG tablet Take 1 tablet (7.5 mg total) by mouth every 8 (eight) hours as needed. 90 tablet 0  . Coenzyme Q10 (CO Q-10) 50 MG CAPS Take 1 capsule by mouth 2 (two) times daily after a meal. (Patient taking differently: Take 1 capsule by mouth as needed. ) 60 each 0  . Eye Patch MISC 1 application by Does not apply route daily.    . furosemide (LASIX) 40 MG tablet Take 1 tablet (40 mg total) by mouth daily as needed (swelling). 30 tablet 3  . hydroxypropyl methylcellulose / hypromellose (ISOPTO TEARS / GONIOVISC) 2.5 % ophthalmic solution Use in left eye during the day every 2-3 hours as needed for dry eye 15 mL 12  . lisinopril (PRINIVIL,ZESTRIL) 5 MG tablet TAKE ONE TABLET BY MOUTH DAILY. 90 tablet 3  . Multiple Vitamins-Minerals (MULTIVITAMIN) tablet Take 1 tablet by mouth daily. Petra Kuba made brand 30 tablet   . pantoprazole (PROTONIX) 40 MG tablet 1 po 30 mins prior to first meal 30 tablet 11  . Polyethyl Glycol-Propyl Glycol (SYSTANE) 0.4-0.3 % GEL ophthalmic gel Place 1 application into both eyes as needed. 10 mL 5  . potassium chloride SA (K-DUR,KLOR-CON) 20 MEQ tablet TAKE ONE TABLET BY MOUTH DAILY 90 tablet 1  . tiotropium (SPIRIVA HANDIHALER) 18 MCG inhalation capsule Place 1 capsule (18 mcg total) into inhaler and inhale daily. 30 capsule 12   No current facility-administered medications for this visit.     Allergies as of 05/17/2018  . (No Known Allergies)    Family History  Problem Relation Age of Onset  . Heart disease Mother 66  . Heart attack  Mother   . Asthma Father   . Cancer Father        pancreastic cancer  . Heart attack Brother 29  . Cancer Sister        unsure  . Colon cancer Neg Hx     Social History   Socioeconomic History  . Marital status: Single    Spouse name: Not on file  . Number of children: Not on file  . Years of education: Not on file  . Highest education level: Not on file  Occupational History  . Not on file  Social Needs  . Financial resource strain: Not on file  . Food insecurity:    Worry: Not on file    Inability: Not on file  . Transportation needs:    Medical: Not on file    Non-medical: Not on file  Tobacco Use  . Smoking status: Never Smoker  . Smokeless tobacco: Never Used  Substance and Sexual Activity  . Alcohol use:  No  . Drug use: No  . Sexual activity: Not on file  Lifestyle  . Physical activity:    Days per week: Not on file    Minutes per session: Not on file  . Stress: Not on file  Relationships  . Social connections:    Talks on phone: Not on file    Gets together: Not on file    Attends religious service: Not on file    Active member of club or organization: Not on file    Attends meetings of clubs or organizations: Not on file    Relationship status: Not on file  Other Topics Concern  . Not on file  Social History Narrative  . Not on file    Review of Systems: Complete ROS negative except as per HPI.   Physical Exam: BP (!) 180/87   Pulse (!) 58   Temp (!) 97.4 F (36.3 C) (Oral)   Ht 5' 5.5" (1.664 m)   Wt 254 lb 6.4 oz (115.4 kg)   BMI 41.69 kg/m  General:   Alert and oriented. Pleasant and cooperative. Well-nourished and well-developed.  Eyes:  Without icterus, sclera clear and conjunctiva pink.  Ears:  Normal auditory acuity. Cardiovascular:  S1, S2 present without murmurs appreciated. Extremities without clubbing or edema. Respiratory:  Clear to auscultation bilaterally. No wheezes, rales, or rhonchi. No distress.  Gastrointestinal:  +BS,  soft, non-tender and non-distended. No HSM noted. No guarding or rebound. No masses appreciated.  Rectal:  Deferred  Musculoskalatal:  Symmetrical without gross deformities. Neurologic:  Alert and oriented x4;  grossly normal neurologically. Psych:  Alert and cooperative. Normal mood and affect. Heme/Lymph/Immune: No excessive bruising noted.    05/17/2018 1:46 PM   Disclaimer: This note was dictated with voice recognition software. Similar sounding words can inadvertently be transcribed and may not be corrected upon review.

## 2018-07-21 DIAGNOSIS — M50322 Other cervical disc degeneration at C5-C6 level: Secondary | ICD-10-CM | POA: Diagnosis not present

## 2018-07-21 DIAGNOSIS — M549 Dorsalgia, unspecified: Secondary | ICD-10-CM | POA: Diagnosis not present

## 2018-07-21 DIAGNOSIS — Z299 Encounter for prophylactic measures, unspecified: Secondary | ICD-10-CM | POA: Diagnosis not present

## 2018-07-21 DIAGNOSIS — M50323 Other cervical disc degeneration at C6-C7 level: Secondary | ICD-10-CM | POA: Diagnosis not present

## 2018-07-21 DIAGNOSIS — M542 Cervicalgia: Secondary | ICD-10-CM | POA: Diagnosis not present

## 2018-07-21 DIAGNOSIS — M47812 Spondylosis without myelopathy or radiculopathy, cervical region: Secondary | ICD-10-CM | POA: Diagnosis not present

## 2018-07-21 DIAGNOSIS — M5136 Other intervertebral disc degeneration, lumbar region: Secondary | ICD-10-CM | POA: Diagnosis not present

## 2018-07-21 DIAGNOSIS — Z6841 Body Mass Index (BMI) 40.0 and over, adult: Secondary | ICD-10-CM | POA: Diagnosis not present

## 2018-07-21 DIAGNOSIS — M545 Low back pain: Secondary | ICD-10-CM | POA: Diagnosis not present

## 2018-07-21 DIAGNOSIS — R5383 Other fatigue: Secondary | ICD-10-CM | POA: Diagnosis not present

## 2018-07-21 DIAGNOSIS — I10 Essential (primary) hypertension: Secondary | ICD-10-CM | POA: Diagnosis not present

## 2018-07-25 ENCOUNTER — Other Ambulatory Visit: Payer: Self-pay | Admitting: Gastroenterology

## 2018-08-10 DIAGNOSIS — H35033 Hypertensive retinopathy, bilateral: Secondary | ICD-10-CM | POA: Diagnosis not present

## 2018-08-10 DIAGNOSIS — H25013 Cortical age-related cataract, bilateral: Secondary | ICD-10-CM | POA: Diagnosis not present

## 2018-08-10 DIAGNOSIS — H04123 Dry eye syndrome of bilateral lacrimal glands: Secondary | ICD-10-CM | POA: Diagnosis not present

## 2018-08-10 DIAGNOSIS — H524 Presbyopia: Secondary | ICD-10-CM | POA: Diagnosis not present

## 2018-08-10 DIAGNOSIS — H2513 Age-related nuclear cataract, bilateral: Secondary | ICD-10-CM | POA: Diagnosis not present

## 2018-08-29 ENCOUNTER — Other Ambulatory Visit: Payer: Self-pay | Admitting: Cardiology

## 2018-08-29 DIAGNOSIS — I480 Paroxysmal atrial fibrillation: Secondary | ICD-10-CM

## 2018-10-24 DIAGNOSIS — M539 Dorsopathy, unspecified: Secondary | ICD-10-CM | POA: Diagnosis not present

## 2018-10-24 DIAGNOSIS — Z789 Other specified health status: Secondary | ICD-10-CM | POA: Diagnosis not present

## 2018-10-24 DIAGNOSIS — I10 Essential (primary) hypertension: Secondary | ICD-10-CM | POA: Diagnosis not present

## 2018-10-24 DIAGNOSIS — M47819 Spondylosis without myelopathy or radiculopathy, site unspecified: Secondary | ICD-10-CM | POA: Diagnosis not present

## 2018-10-24 DIAGNOSIS — Z299 Encounter for prophylactic measures, unspecified: Secondary | ICD-10-CM | POA: Diagnosis not present

## 2018-10-24 DIAGNOSIS — Z6841 Body Mass Index (BMI) 40.0 and over, adult: Secondary | ICD-10-CM | POA: Diagnosis not present

## 2018-10-25 ENCOUNTER — Other Ambulatory Visit: Payer: Self-pay | Admitting: Cardiology

## 2018-10-25 DIAGNOSIS — Z23 Encounter for immunization: Secondary | ICD-10-CM | POA: Diagnosis not present

## 2018-11-06 ENCOUNTER — Other Ambulatory Visit: Payer: Self-pay | Admitting: Cardiology

## 2018-11-06 DIAGNOSIS — I480 Paroxysmal atrial fibrillation: Secondary | ICD-10-CM

## 2018-11-06 DIAGNOSIS — I2510 Atherosclerotic heart disease of native coronary artery without angina pectoris: Secondary | ICD-10-CM

## 2018-11-06 DIAGNOSIS — I5021 Acute systolic (congestive) heart failure: Secondary | ICD-10-CM

## 2018-11-08 ENCOUNTER — Encounter

## 2018-11-08 ENCOUNTER — Encounter: Payer: Self-pay | Admitting: Cardiology

## 2018-11-08 ENCOUNTER — Ambulatory Visit (INDEPENDENT_AMBULATORY_CARE_PROVIDER_SITE_OTHER): Payer: Medicare Other | Admitting: Cardiology

## 2018-11-08 VITALS — BP 100/68 | HR 65 | Ht 65.0 in | Wt 258.2 lb

## 2018-11-08 DIAGNOSIS — E782 Mixed hyperlipidemia: Secondary | ICD-10-CM

## 2018-11-08 DIAGNOSIS — I5022 Chronic systolic (congestive) heart failure: Secondary | ICD-10-CM | POA: Diagnosis not present

## 2018-11-08 DIAGNOSIS — I480 Paroxysmal atrial fibrillation: Secondary | ICD-10-CM | POA: Diagnosis not present

## 2018-11-08 DIAGNOSIS — I2510 Atherosclerotic heart disease of native coronary artery without angina pectoris: Secondary | ICD-10-CM

## 2018-11-08 NOTE — Patient Instructions (Signed)
Your physician recommends that you schedule a follow-up appointment in: 3-4 [place]  Your physician recommends that you continue on your current medications as directed. Please refer to the Current Medication list given to you today.  Thank you for choosing [facility]!!

## 2018-11-08 NOTE — Progress Notes (Signed)
Clinical Summary Pamela Soto is a 67 y.o.female  seen today for follow up of the following medical problems.   1. NICM/Chronic systolic HF/NSTEMI - admit Jan 2018 with CHF - echo at that time showed LVEF 35-40%, a new finding for the patient.  - referred for cath. Feb 2018 cath: distal LAD100%, Prox RCA 40%, OM3 40%. RHC mean PA 37, PCWP 26, CI 1.73. CAD medically managed.  - repeat echo 07/2017: LVEF 50-55%, abnormal diastolic function   - mild chest pain recently. + wheezing.  Pressing feeling on chest with activity. DOE with activities like walking at the store x few months. No recent edema. Symptoms can last a few hours, up to 3-4 hours. Not positional. Not related to food or eating - +orthopnea  2. COPD - 03/2017 PFTs: severe COPD - followed by Dr Luan Pulling  - +wheezing with temperature change. Uses spiriva daily, has not used nebulzier.    3. Afib - no recent palpitations - no recent bleeding on eliquis.    4. Hyperlipidemia - reports nonspecific neck and shoulder pains, unclear if related to statin.  - somewhat improved with lower statin dosing -07/2017 TC 131 TG 108 HDL 48 LDL 61 - atorvastatin recently increased to 40mg  daily. Since that time has had some stomach upset for few week, constiptation alternatingwith diarrhea.   - tolerating atorva 20mg  daily - reports most recent labs done by pcp   5. DM2 - followed by pcp   6. OSA - sleep study 03/2017 with severe OSA.On bipap, followed by Dr Luan Pulling   Past Medical History:  Diagnosis Date  . Acid reflux   . Hypertension   . Sleep apnea      No Known Allergies   Current Outpatient Medications  Medication Sig Dispense Refill  . acetaminophen (TYLENOL) 500 MG tablet Take 1 tablet (500 mg total) by mouth every 6 (six) hours as needed. (Patient taking differently: Take 500 mg by mouth every 6 (six) hours as needed for mild pain or moderate pain. ) 30 tablet 0  . albuterol (PROVENTIL) (2.5  MG/3ML) 0.083% nebulizer solution Take 3 mLs (2.5 mg total) by nebulization every 6 (six) hours as needed for wheezing or shortness of breath. 75 mL 12  . artificial tears (LACRILUBE) OINT ophthalmic ointment Use in left eye at night and tape eye shut 1 Tube 1  . atorvastatin (LIPITOR) 20 MG tablet TAKE ONE TABLET BY MOUTH EVERY DAY 30 tablet 6  . bisoprolol (ZEBETA) 5 MG tablet TAKE ONE TABLET BY MOUTH EVERY DAY 90 tablet 1  . busPIRone (BUSPAR) 7.5 MG tablet Take 1 tablet (7.5 mg total) by mouth every 8 (eight) hours as needed. 90 tablet 0  . Coenzyme Q10 (CO Q-10) 50 MG CAPS Take 1 capsule by mouth 2 (two) times daily after a meal. (Patient taking differently: Take 1 capsule by mouth as needed. ) 60 each 0  . ELIQUIS 5 MG TABS tablet TAKE ONE TABLET BY MOUTH TWICE DAILY. 60 tablet 0  . Eye Patch MISC 1 application by Does not apply route daily.    . furosemide (LASIX) 40 MG tablet Take 1 tablet (40 mg total) by mouth daily as needed (swelling). 30 tablet 3  . hydroxypropyl methylcellulose / hypromellose (ISOPTO TEARS / GONIOVISC) 2.5 % ophthalmic solution Use in left eye during the day every 2-3 hours as needed for dry eye 15 mL 12  . lisinopril (PRINIVIL,ZESTRIL) 5 MG tablet TAKE ONE TABLET BY MOUTH DAILY. [place]  tablet 3  . Multiple Vitamins-Minerals (MULTIVITAMIN) tablet Take 1 tablet by mouth daily. Petra Kuba made brand 30 tablet   . pantoprazole (PROTONIX) 40 MG tablet TAKE ONE TABLET BY MOUTH 30 MINUTES PRIOR TO FIRST MEAL 90 tablet 3  . Polyethyl Glycol-Propyl Glycol (SYSTANE) 0.4-0.3 % GEL ophthalmic gel Place 1 application into both eyes as needed. 10 mL 5  . potassium chloride SA (K-DUR,KLOR-CON) 20 MEQ tablet TAKE ONE TABLET BY MOUTH DAILY 90 tablet 1  . tiotropium (SPIRIVA HANDIHALER) 18 MCG inhalation capsule Place 1 capsule (18 mcg total) into inhaler and inhale daily. 30 capsule 12   No current facility-administered medications for this visit.      Past Surgical History:  Procedure  Laterality Date  . BIOPSY  09/13/2017   Procedure: BIOPSY;  Surgeon: Danie Binder, MD;  Location: [facility];  Service: Endoscopy;;  colon duodenum gastric  . BREAST BIOPSY Right    normal result  . CESAREAN SECTION    . CHOLECYSTECTOMY  11/06/2012   Procedure: LAPAROSCOPIC CHOLECYSTECTOMY WITH INTRAOPERATIVE CHOLANGIOGRAM;  Surgeon: Pedro Earls, MD;  Location: [facility];  Service: General;  Laterality: N/A;  . COLONOSCOPY N/A 09/13/2017   Procedure: COLONOSCOPY;  Surgeon: Danie Binder, MD;  [facility]: [facility];  Service: Endoscopy;  Laterality: N/A;  245  . ESOPHAGOGASTRODUODENOSCOPY N/A 09/13/2017   Procedure: ESOPHAGOGASTRODUODENOSCOPY (EGD);  Surgeon: Danie Binder, MD;  [facility]: [facility];  Service: Endoscopy;  Laterality: N/A;  . POLYPECTOMY  09/13/2017   Procedure: POLYPECTOMY;  Surgeon: Danie Binder, MD;  [facility]: [facility];  Service: Endoscopy;;  colon  . RIGHT/LEFT HEART CATH AND CORONARY ANGIOGRAPHY N/A 01/10/2017   Procedure: Right/Left Heart Cath and Coronary Angiography;  Surgeon: Belva Crome, MD;  Location: [place] [facility];  Service: Cardiovascular;  Laterality: N/A;  . UMBILICAL HERNIA REPAIR  11/06/2012   Procedure: HERNIA REPAIR UMBILICAL ADULT;  Surgeon: Pedro Earls, MD;  Location: [facility];  Service: General;;     No Known Allergies    Family History  Problem Relation Age of Onset  . Heart disease Mother 44  . Heart attack Mother   . Asthma Father   . Cancer Father        pancreastic cancer  . Heart attack Brother 47  . Cancer Sister        unsure  . Colon cancer Neg Hx      Social History Pamela Soto reports that she has never smoked. She has never used smokeless tobacco. Pamela Soto reports that she does not drink alcohol.   Review of Systems CONSTITUTIONAL: No weight loss, fever, chills, weakness or fatigue.  HEENT: Eyes: No visual loss, blurred vision, double vision or yellow sclerae.No hearing loss, sneezing,  congestion, runny nose or sore throat.  SKIN: No rash or itching.  CARDIOVASCULAR: per hpi RESPIRATORY: per hpi GASTROINTESTINAL: No anorexia, nausea, vomiting or diarrhea. No abdominal pain or blood.  GENITOURINARY: No burning on urination, no polyuria NEUROLOGICAL: No headache, dizziness, syncope, paralysis, ataxia, numbness or tingling in the extremities. No change in bowel or bladder control.  MUSCULOSKELETAL: No muscle, back pain, joint pain or stiffness.  LYMPHATICS: No enlarged nodes. No history of splenectomy.  PSYCHIATRIC: No history of depression or anxiety.  ENDOCRINOLOGIC: No reports of sweating, cold or heat intolerance. No polyuria or polydipsia.  [name]   Physical Examination Vitals:   11/08/18 0917  BP: 100/68  Pulse: 65  SpO2: 91%   Vitals:  11/08/18 0917  Weight: 258 lb 3.2 oz (117.1 kg)  Height: 5\' 5"  (1.651 m)    Gen: resting comfortably, no acute distress HEENT: no scleral icterus, pupils equal round and reactive, no palptable cervical adenopathy,  CV: RRR, no m/r/g, no jvd Resp: Clear to auscultation bilaterally GI: abdomen is soft, non-tender, non-distended, normal bowel sounds, no hepatosplenomegaly MSK: extremities are warm, no edema.  Skin: warm, no rash Neuro:  no focal deficits Psych: appropriate affect   Diagnostic Studies 01/2017 cath  Dist LAD lesion, 100 %stenosed.  Prox RCA lesion, 40 %stenosed.  3rd Mrg lesion, 40 %stenosed.  There is severe left ventricular systolic dysfunction.  The left ventricular ejection fraction is less than 25% by visual estimate.  LV end diastolic pressure is severely elevated.  Hemodynamic findings consistent with moderate pulmonary hypertension.   Acute atrial fibrillation developed on the cath table during right heart/right atrial pressure recording. Rate 118 bpm. Atrial fibrillation was treated with one bolus of IV amiodarone.  40% proximal RCA stenosis.  Tortuous but normal circumflex  100%  distal/apical LAD stenosis. This lesion is probably chronic but difficult to determine age. There is also a possibility that it is acute embolic occlusion from a catheter-based thrombus. For that reason additional heparin and then Aggrastat bolus was given as the patient complained of chest pressure post procedure.  Chest pressure post procedure is similar to prior episodes.  Severe LV dysfunction with EF less than 25%. Contraction pattern appears global.  Arterial blood gas demonstrated severe CO2 retention, pCO2 67.    01/2017 echo Study Conclusions  - Left ventricle: The cavity size was mildly dilated. Wall thickness was increased in a pattern of moderate LVH. Systolic function was moderately reduced. The estimated ejection fraction was in the range of 35% to 40%. Diffuse hypokinesis. - Aortic valve: Mildly calcified annulus. Trileaflet; mildly thickened leaflets. - Mitral valve: There was mild regurgitation. - Left atrium: The atrium was moderately dilated. - Atrial septum: The septum bowed from left to right, consistent with increased left atrial pressure. - Pulmonic valve: There was mild regurgitation.   07/2017 echo Study Conclusions  - Left ventricle: The cavity size was normal. Wall thickness was increased increased in a pattern of mild to moderate LVH. Systolic function was normal. The estimated ejection fraction was in the range of 50% to 55%. Diastolic function is abnormal, indeterminant grade. LVOT VTI and Vmax likely underestimated based on angle of acquisition. - Aortic valve: Valve area (VTI): 1.56 cm^2. Valve area (Vmax): 1.84 cm^2. - Technically difficult study.      Assessment and Plan  1. CAD/Chronic systolic HF -recent pressing feeling on chest that can last hours, wheezing and SOB. Symptoms more suggestive of her COPD as opposed to CHF or CAD. Her exam for fluid is limited by body habitus - she will try using her  nebulzizer treatments at home for current symptoms, if does not improve would consider trial of diuresis. At this time ischemia is lower on differential as cause of her symptoms.  EKG shows SR, no ischemic changes   2. Afib - CHADS2Vasc score is 5, she will continue eliquis - no recent symptoms. Continue current meds  3. Hyperlipidemia -continue low dose statin. Unclear if prior symptoms truly were related to higher statin dosing. Request labs from pcp, pending results would retry lipitor 40mg  daily.  .    F/u 3-4 weeks.     Arnoldo Lenis, M.D.

## 2018-11-09 ENCOUNTER — Encounter: Payer: Self-pay | Admitting: *Deleted

## 2018-11-14 DIAGNOSIS — Z299 Encounter for prophylactic measures, unspecified: Secondary | ICD-10-CM | POA: Diagnosis not present

## 2018-11-14 DIAGNOSIS — M25552 Pain in left hip: Secondary | ICD-10-CM | POA: Diagnosis not present

## 2018-11-14 DIAGNOSIS — Z6841 Body Mass Index (BMI) 40.0 and over, adult: Secondary | ICD-10-CM | POA: Diagnosis not present

## 2018-11-14 DIAGNOSIS — I10 Essential (primary) hypertension: Secondary | ICD-10-CM | POA: Diagnosis not present

## 2018-11-14 DIAGNOSIS — M7061 Trochanteric bursitis, right hip: Secondary | ICD-10-CM | POA: Diagnosis not present

## 2018-11-14 DIAGNOSIS — M19049 Primary osteoarthritis, unspecified hand: Secondary | ICD-10-CM | POA: Diagnosis not present

## 2018-11-14 DIAGNOSIS — M461 Sacroiliitis, not elsewhere classified: Secondary | ICD-10-CM | POA: Diagnosis not present

## 2018-11-14 DIAGNOSIS — M7062 Trochanteric bursitis, left hip: Secondary | ICD-10-CM | POA: Diagnosis not present

## 2018-11-16 ENCOUNTER — Other Ambulatory Visit: Payer: Self-pay | Admitting: Cardiology

## 2018-11-16 DIAGNOSIS — I5021 Acute systolic (congestive) heart failure: Secondary | ICD-10-CM

## 2018-11-16 DIAGNOSIS — I2510 Atherosclerotic heart disease of native coronary artery without angina pectoris: Secondary | ICD-10-CM

## 2018-11-28 ENCOUNTER — Other Ambulatory Visit: Payer: Self-pay | Admitting: *Deleted

## 2018-11-28 DIAGNOSIS — I5021 Acute systolic (congestive) heart failure: Secondary | ICD-10-CM

## 2018-11-28 DIAGNOSIS — I2510 Atherosclerotic heart disease of native coronary artery without angina pectoris: Secondary | ICD-10-CM

## 2018-11-28 DIAGNOSIS — I480 Paroxysmal atrial fibrillation: Secondary | ICD-10-CM

## 2018-11-28 MED ORDER — BISOPROLOL FUMARATE 5 MG PO TABS: 5.0000 mg | ORAL_TABLET | Freq: Every day | ORAL | 1 refills | Status: DC

## 2018-12-07 DIAGNOSIS — M62830 Muscle spasm of back: Secondary | ICD-10-CM | POA: Diagnosis not present

## 2018-12-07 DIAGNOSIS — Z6841 Body Mass Index (BMI) 40.0 and over, adult: Secondary | ICD-10-CM | POA: Diagnosis not present

## 2018-12-07 DIAGNOSIS — J449 Chronic obstructive pulmonary disease, unspecified: Secondary | ICD-10-CM | POA: Diagnosis not present

## 2018-12-07 DIAGNOSIS — M7062 Trochanteric bursitis, left hip: Secondary | ICD-10-CM | POA: Diagnosis not present

## 2018-12-07 DIAGNOSIS — I10 Essential (primary) hypertension: Secondary | ICD-10-CM | POA: Diagnosis not present

## 2018-12-07 DIAGNOSIS — Z299 Encounter for prophylactic measures, unspecified: Secondary | ICD-10-CM | POA: Diagnosis not present

## 2018-12-16 ENCOUNTER — Other Ambulatory Visit: Payer: Self-pay | Admitting: Cardiology

## 2018-12-16 DIAGNOSIS — I480 Paroxysmal atrial fibrillation: Secondary | ICD-10-CM

## 2018-12-18 ENCOUNTER — Encounter: Payer: Self-pay | Admitting: Cardiology

## 2018-12-18 ENCOUNTER — Ambulatory Visit (INDEPENDENT_AMBULATORY_CARE_PROVIDER_SITE_OTHER): Payer: Medicare Other | Admitting: Cardiology

## 2018-12-18 VITALS — BP 120/68 | HR 69 | Ht 65.0 in | Wt 252.8 lb

## 2018-12-18 DIAGNOSIS — E782 Mixed hyperlipidemia: Secondary | ICD-10-CM | POA: Diagnosis not present

## 2018-12-18 DIAGNOSIS — R0602 Shortness of breath: Secondary | ICD-10-CM

## 2018-12-18 MED ORDER — ATORVASTATIN CALCIUM 40 MG PO TABS: 40.0000 mg | ORAL_TABLET | Freq: Every day | ORAL | 3 refills | Status: DC

## 2018-12-18 NOTE — Patient Instructions (Addendum)
Medication Instructions:   Your physician has recommended you make the following change in your medication:   Increase atorvastatin to 40 mg by mouth daily. You may take (2) of your 20 mg tablets daily until they are finished.  Continue all other medications the same.  Labwork:  NONE  Testing/Procedures:  NONE  Follow-Up:  Your physician recommends that you schedule a follow-up appointment in: 6 months. You will receive a reminder letter in the mail in about 4 months reminding you to call and schedule your appointment. If you don't receive this letter, please contact our office.  Any Other Special Instructions Will Be Listed Below (If Applicable).  If you need a refill on your cardiac medications before your next appointment, please call your pharmacy.

## 2018-12-18 NOTE — Progress Notes (Signed)
Clinical Summary Pamela Soto is a 68 y.o.female seen today for follow up of the following medical problems. This is a focused visit on recent SOB.   1. SOB - mild chest pain recently. + wheezing.  Pressing feeling on chest with activity. DOE with activities like walking at the store x few months. No recent edema. Symptoms can last a few hours, up to 3-4 hours. Not positional. Not related to food or eating - +orthopnea  - breathing much improved since more regualar use of her breathing treatments at home, overall symptoms have resolved.  - takes lasix prn, has not required.      Past Medical History:  Diagnosis Date  . Acid reflux   . Hypertension   . Sleep apnea      No Known Allergies   Current Outpatient Medications  Medication Sig Dispense Refill  . acetaminophen (TYLENOL) 500 MG tablet Take 1 tablet (500 mg total) by mouth every 6 (six) hours as needed. (Patient taking differently: Take 500 mg by mouth every 6 (six) hours as needed for mild pain or moderate pain. ) 30 tablet 0  . albuterol (PROVENTIL) (2.5 MG/3ML) 0.083% nebulizer solution Take 3 mLs (2.5 mg total) by nebulization every 6 (six) hours as needed for wheezing or shortness of breath. 75 mL 12  . artificial tears (LACRILUBE) OINT ophthalmic ointment Use in left eye at night and tape eye shut 1 Tube 1  . atorvastatin (LIPITOR) 20 MG tablet TAKE ONE TABLET BY MOUTH EVERY DAY 30 tablet 6  . bisoprolol (ZEBETA) 5 MG tablet Take 1 tablet (5 mg total) by mouth daily. 90 tablet 1  . busPIRone (BUSPAR) 7.5 MG tablet Take 1 tablet (7.5 mg total) by mouth every 8 (eight) hours as needed. 90 tablet 0  . Coenzyme Q10 (CO Q-10) 50 MG CAPS Take 1 capsule by mouth 2 (two) times daily after a meal. (Patient taking differently: Take 1 capsule by mouth as needed. ) 60 each 0  . ELIQUIS 5 MG TABS tablet TAKE ONE TABLET BY MOUTH TWICE DAILY. 60 tablet 6  . Eye Patch MISC 1 application by Does not apply route daily.    .  furosemide (LASIX) 40 MG tablet Take 1 tablet (40 mg total) by mouth daily as needed (swelling). 30 tablet 3  . hydroxypropyl methylcellulose / hypromellose (ISOPTO TEARS / GONIOVISC) 2.5 % ophthalmic solution Use in left eye during the day every 2-3 hours as needed for dry eye 15 mL 12  . lisinopril (PRINIVIL,ZESTRIL) 5 MG tablet TAKE ONE TABLET BY MOUTH DAILY. 90 tablet 3  . Multiple Vitamins-Minerals (MULTIVITAMIN) tablet Take 1 tablet by mouth daily. Petra Kuba made brand 30 tablet   . pantoprazole (PROTONIX) 40 MG tablet TAKE ONE TABLET BY MOUTH 30 MINUTES PRIOR TO FIRST MEAL 90 tablet 3  . Polyethyl Glycol-Propyl Glycol (SYSTANE) 0.4-0.3 % GEL ophthalmic gel Place 1 application into both eyes as needed. 10 mL 5  . potassium chloride SA (K-DUR,KLOR-CON) 20 MEQ tablet TAKE ONE TABLET BY MOUTH DAILY 90 tablet 1  . tiotropium (SPIRIVA HANDIHALER) 18 MCG inhalation capsule Place 1 capsule (18 mcg total) into inhaler and inhale daily. 30 capsule 12   No current facility-administered medications for this visit.      Past Surgical History:  Procedure Laterality Date  . BIOPSY  09/13/2017   Procedure: BIOPSY;  Surgeon: Danie Binder, MD;  [facility];  Service: Endoscopy;;  colon duodenum gastric  . BREAST BIOPSY  Right    normal result  . CESAREAN SECTION    . CHOLECYSTECTOMY  11/06/2012   Procedure: LAPAROSCOPIC CHOLECYSTECTOMY WITH INTRAOPERATIVE CHOLANGIOGRAM;  Surgeon: Pedro Earls, MD;  Location: [facility];  Service: General;  Laterality: N/A;  . COLONOSCOPY N/A 09/13/2017   Procedure: COLONOSCOPY;  Surgeon: Danie Binder, MD;  [facility];  Service: Endoscopy;  Laterality: N/A;  245  . ESOPHAGOGASTRODUODENOSCOPY N/A 09/13/2017   Procedure: ESOPHAGOGASTRODUODENOSCOPY (EGD);  Surgeon: Danie Binder, MD;  [facility];  Service: Endoscopy;  Laterality: N/A;  . POLYPECTOMY  09/13/2017   Procedure: POLYPECTOMY;  Surgeon: Danie Binder, MD;  [facility];  Service: Endoscopy;;  colon  . RIGHT/LEFT HEART CATH AND CORONARY ANGIOGRAPHY N/A 01/10/2017   Procedure: Right/Left Heart Cath and Coronary Angiography;  Surgeon: Belva Crome, MD;  Location: [place] [facility];  Service: Cardiovascular;  Laterality: N/A;  . UMBILICAL HERNIA REPAIR  11/06/2012   Procedure: HERNIA REPAIR UMBILICAL ADULT;  Surgeon: Pedro Earls, MD;  Location: [facility];  Service: General;;     No Known Allergies    Family History  Problem Relation Age of Onset  . Heart disease Mother 42  . Heart attack Mother   . Asthma Father   . Cancer Father        pancreastic cancer  . Heart attack Brother 30  . Cancer Sister        unsure  . Colon cancer Neg Hx      Social History Ms. Lagace reports that she has never smoked. She has never used smokeless tobacco. [name] reports no history of alcohol use.   Review of Systems CONSTITUTIONAL: No weight loss, fever, chills, weakness or fatigue.  HEENT: Eyes: No visual loss, blurred vision, double vision or yellow sclerae.No hearing loss, sneezing, congestion, runny nose or sore throat.  SKIN: No rash or itching.  CARDIOVASCULAR: no chest pain, no palpitations.  RESPIRATORY: No shortness of breath, cough or sputum.  GASTROINTESTINAL: No anorexia, nausea, vomiting or diarrhea. No abdominal pain or blood.  GENITOURINARY: No burning on urination, no polyuria NEUROLOGICAL: No headache, dizziness, syncope, paralysis, ataxia, numbness or tingling in the extremities. No change in bowel or bladder control.  MUSCULOSKELETAL: No muscle, back pain, joint pain or stiffness.  LYMPHATICS: No enlarged nodes. No history of splenectomy.  PSYCHIATRIC: No history of depression or anxiety.  ENDOCRINOLOGIC: No reports of sweating, cold or heat intolerance. No polyuria or polydipsia.  [name]   Physical Examination Vitals:   12/18/18 1325  BP: 120/68  Pulse: 69  SpO2: 93%   Vitals:   12/18/18 1325  Weight: 252 lb 12.8 oz  (114.7 kg)  Height: 5\' 5"  (1.651 m)    Gen: resting comfortably, no acute distress HEENT: no scleral icterus, pupils equal round and reactive, no palptable cervical adenopathy,  CV: RRR, no m/r/g, no jvd Resp: Clear to auscultation bilaterally GI: abdomen is soft, non-tender, non-distended, normal bowel sounds, no hepatosplenomegaly MSK: extremities are warm, no edema.  Skin: warm, no rash Neuro:  no focal deficits Psych: appropriate affect   Diagnostic Studies 01/2017 cath  Dist LAD lesion, 100 %stenosed.  Prox RCA lesion, 40 %stenosed.  3rd Mrg lesion, 40 %stenosed.  There is severe left ventricular systolic dysfunction.  The left ventricular ejection fraction is less than 25% by visual estimate.  LV end diastolic pressure is severely elevated.  Hemodynamic findings consistent with moderate pulmonary hypertension.   Acute atrial fibrillation developed  on the cath table during right heart/right atrial pressure recording. Rate 118 bpm. Atrial fibrillation was treated with one bolus of IV amiodarone.  40% proximal RCA stenosis.  Tortuous but normal circumflex  100% distal/apical LAD stenosis. This lesion is probably chronic but difficult to determine age. There is also a possibility that it is acute embolic occlusion from a catheter-based thrombus. For that reason additional heparin and then Aggrastat bolus was given as the patient complained of chest pressure post procedure.  Chest pressure post procedure is similar to prior episodes.  Severe LV dysfunction with EF less than 25%. Contraction pattern appears global.  Arterial blood gas demonstrated severe CO2 retention, pCO2 67.    01/2017 echo Study Conclusions  - Left ventricle: The cavity size was mildly dilated. Wall thickness was increased in a pattern of moderate LVH. Systolic function was moderately reduced. The estimated ejection fraction was in the range of 35% to 40%. Diffuse hypokinesis. -  Aortic valve: Mildly calcified annulus. Trileaflet; mildly thickened leaflets. - Mitral valve: There was mild regurgitation. - Left atrium: The atrium was moderately dilated. - Atrial septum: The septum bowed from left to right, consistent with increased left atrial pressure. - Pulmonic valve: There was mild regurgitation.   07/2017 echo Study Conclusions  - Left ventricle: The cavity size was normal. Wall thickness was increased increased in a pattern of mild to moderate LVH. Systolic function was normal. The estimated ejection fraction was in the range of 50% to 55%. Diastolic function is abnormal, indeterminant grade. LVOT VTI and Vmax likely underestimated based on angle of acquisition. - Aortic valve: Valve area (VTI): 1.56 cm^2. Valve area (Vmax): 1.84 cm^2. - Technically difficult study.    Assessment and Plan  1. SOB - symptoms have resolved with more regular use of her breathing treatments, appears was all related to her COPD - continue current therapy  2. Hyperlipidemia - try increasing atorva to 40mg  daily. Prevously lowered to 20mg  due to GI symptoms but around that time was diagnosed with Hpylori gastritis and treated. If recurrent symptoms on atorva 40 would lower dose for good.    F/u 6 months  Arnoldo Lenis, M.D.

## 2018-12-22 DIAGNOSIS — R262 Difficulty in walking, not elsewhere classified: Secondary | ICD-10-CM | POA: Diagnosis not present

## 2018-12-22 DIAGNOSIS — M5416 Radiculopathy, lumbar region: Secondary | ICD-10-CM | POA: Diagnosis not present

## 2018-12-22 DIAGNOSIS — M545 Low back pain: Secondary | ICD-10-CM | POA: Diagnosis not present

## 2018-12-27 DIAGNOSIS — M5416 Radiculopathy, lumbar region: Secondary | ICD-10-CM | POA: Diagnosis not present

## 2018-12-27 DIAGNOSIS — R262 Difficulty in walking, not elsewhere classified: Secondary | ICD-10-CM | POA: Diagnosis not present

## 2018-12-27 DIAGNOSIS — M545 Low back pain: Secondary | ICD-10-CM | POA: Diagnosis not present

## 2018-12-28 DIAGNOSIS — M5416 Radiculopathy, lumbar region: Secondary | ICD-10-CM | POA: Diagnosis not present

## 2018-12-28 DIAGNOSIS — R262 Difficulty in walking, not elsewhere classified: Secondary | ICD-10-CM | POA: Diagnosis not present

## 2018-12-28 DIAGNOSIS — M545 Low back pain: Secondary | ICD-10-CM | POA: Diagnosis not present

## 2019-01-03 DIAGNOSIS — M545 Low back pain: Secondary | ICD-10-CM | POA: Diagnosis not present

## 2019-01-03 DIAGNOSIS — R262 Difficulty in walking, not elsewhere classified: Secondary | ICD-10-CM | POA: Diagnosis not present

## 2019-01-03 DIAGNOSIS — M5416 Radiculopathy, lumbar region: Secondary | ICD-10-CM | POA: Diagnosis not present

## 2019-01-04 DIAGNOSIS — R262 Difficulty in walking, not elsewhere classified: Secondary | ICD-10-CM | POA: Diagnosis not present

## 2019-01-04 DIAGNOSIS — M545 Low back pain: Secondary | ICD-10-CM | POA: Diagnosis not present

## 2019-01-04 DIAGNOSIS — M5416 Radiculopathy, lumbar region: Secondary | ICD-10-CM | POA: Diagnosis not present

## 2019-01-07 IMAGING — DX DG CHEST 2V
2 series · 2 of 2 positions shown · non-contrast
Comparison: Chest x-ray 10/13/2012

CLINICAL DATA: Shortness of breath for 2-3 days, some chest pain

EXAM:
CHEST  2 VIEW

[chest pa]
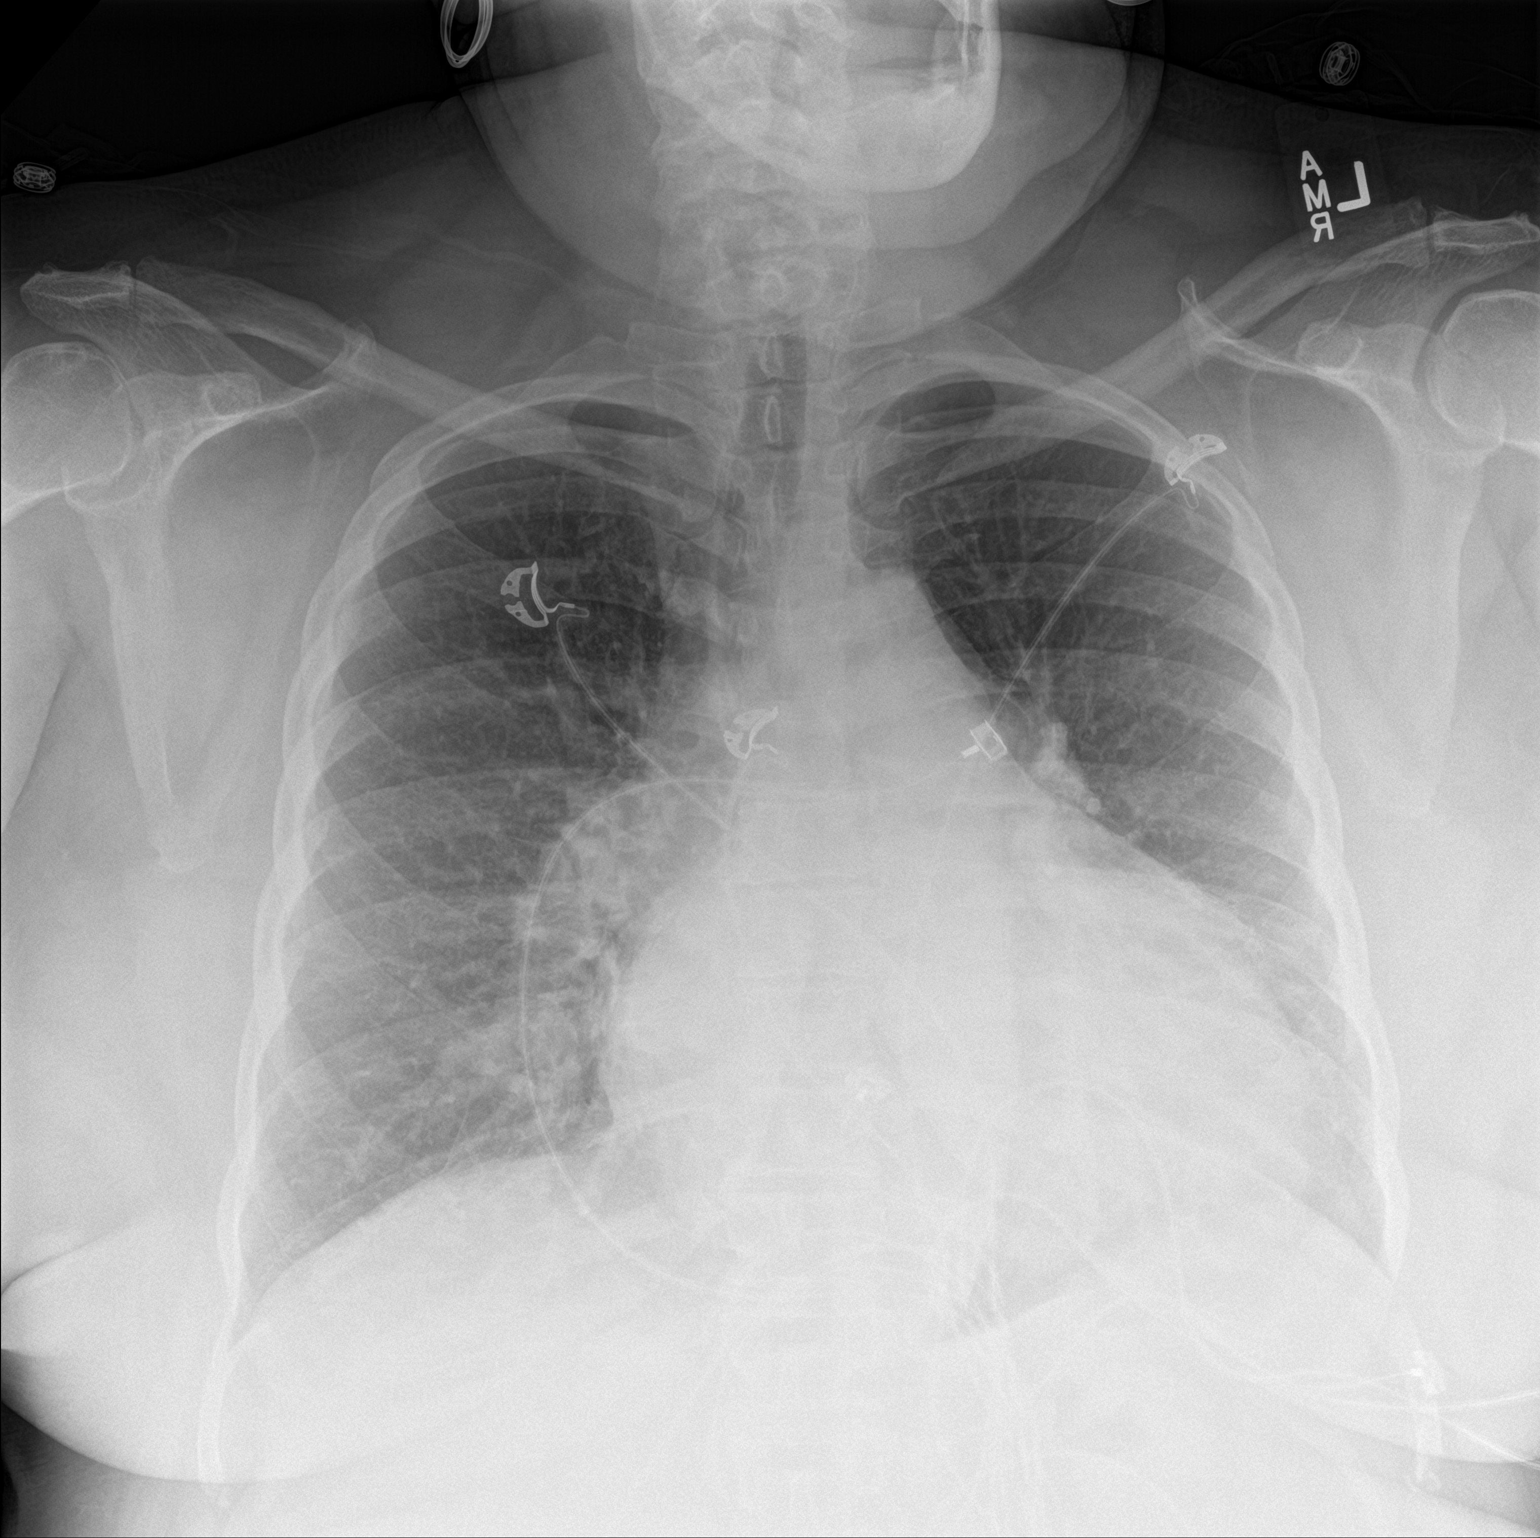

[chest lat]
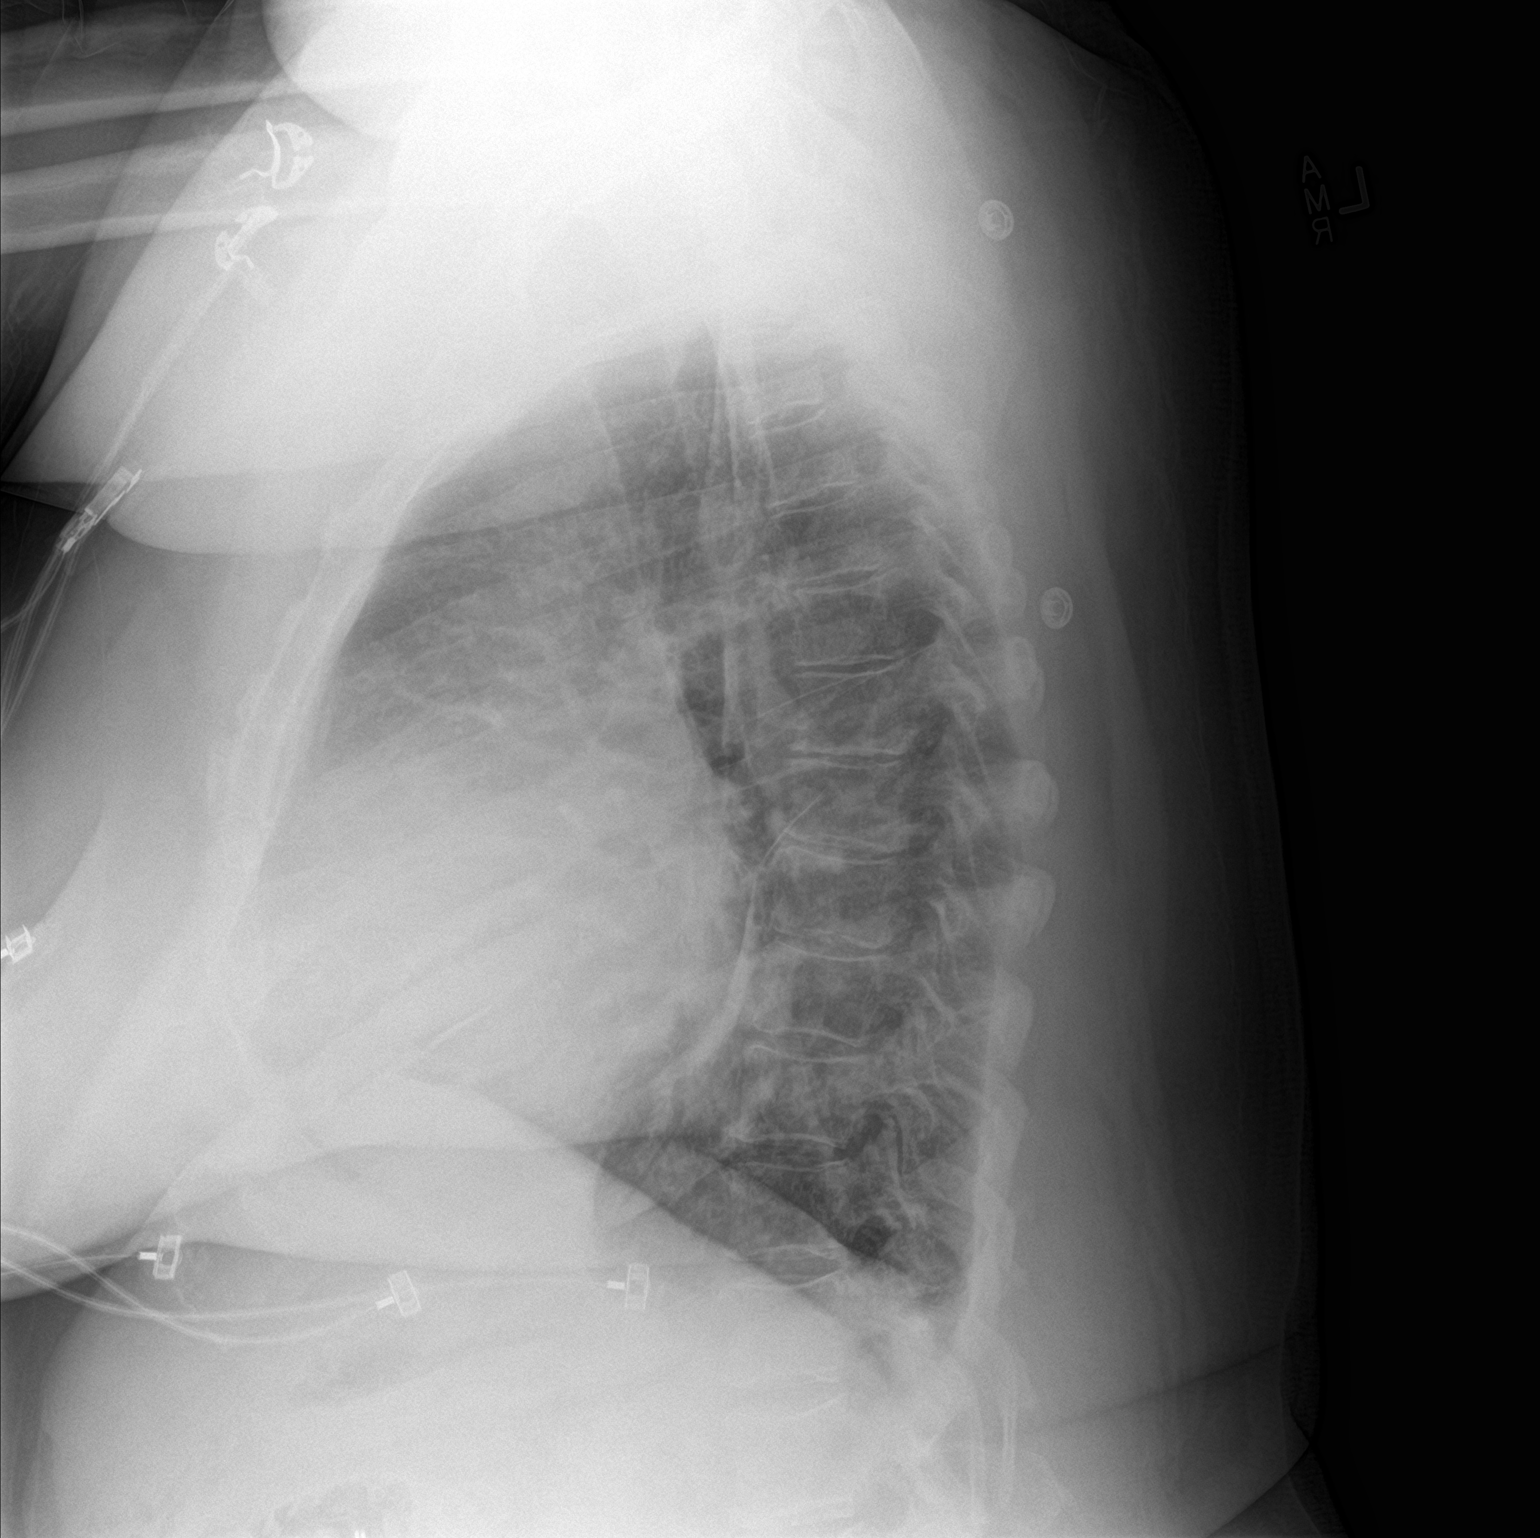

[2 of 2 positions shown; findings below may reference images not displayed]

FINDINGS: No active infiltrate or effusion is seen. Mediastinal and hilar
contours are unremarkable. Cardiomegaly is stable. No acute bony
abnormality is seen.
IMPRESSION: Stable cardiomegaly.  No active lung disease.

## 2019-01-08 DIAGNOSIS — R262 Difficulty in walking, not elsewhere classified: Secondary | ICD-10-CM | POA: Diagnosis not present

## 2019-01-08 DIAGNOSIS — M545 Low back pain: Secondary | ICD-10-CM | POA: Diagnosis not present

## 2019-01-08 DIAGNOSIS — M5416 Radiculopathy, lumbar region: Secondary | ICD-10-CM | POA: Diagnosis not present

## 2019-01-12 DIAGNOSIS — M5416 Radiculopathy, lumbar region: Secondary | ICD-10-CM | POA: Diagnosis not present

## 2019-01-12 DIAGNOSIS — R262 Difficulty in walking, not elsewhere classified: Secondary | ICD-10-CM | POA: Diagnosis not present

## 2019-01-12 DIAGNOSIS — M545 Low back pain: Secondary | ICD-10-CM | POA: Diagnosis not present

## 2019-01-16 DIAGNOSIS — R262 Difficulty in walking, not elsewhere classified: Secondary | ICD-10-CM | POA: Diagnosis not present

## 2019-01-16 DIAGNOSIS — M545 Low back pain: Secondary | ICD-10-CM | POA: Diagnosis not present

## 2019-01-16 DIAGNOSIS — M5416 Radiculopathy, lumbar region: Secondary | ICD-10-CM | POA: Diagnosis not present

## 2019-01-18 DIAGNOSIS — R262 Difficulty in walking, not elsewhere classified: Secondary | ICD-10-CM | POA: Diagnosis not present

## 2019-01-18 DIAGNOSIS — M545 Low back pain: Secondary | ICD-10-CM | POA: Diagnosis not present

## 2019-01-18 DIAGNOSIS — M5416 Radiculopathy, lumbar region: Secondary | ICD-10-CM | POA: Diagnosis not present

## 2019-01-23 DIAGNOSIS — M545 Low back pain: Secondary | ICD-10-CM | POA: Diagnosis not present

## 2019-01-23 DIAGNOSIS — R262 Difficulty in walking, not elsewhere classified: Secondary | ICD-10-CM | POA: Diagnosis not present

## 2019-01-23 DIAGNOSIS — M5416 Radiculopathy, lumbar region: Secondary | ICD-10-CM | POA: Diagnosis not present

## 2019-01-29 DIAGNOSIS — J449 Chronic obstructive pulmonary disease, unspecified: Secondary | ICD-10-CM | POA: Diagnosis not present

## 2019-01-29 DIAGNOSIS — Z299 Encounter for prophylactic measures, unspecified: Secondary | ICD-10-CM | POA: Diagnosis not present

## 2019-01-29 DIAGNOSIS — I10 Essential (primary) hypertension: Secondary | ICD-10-CM | POA: Diagnosis not present

## 2019-01-29 DIAGNOSIS — Z789 Other specified health status: Secondary | ICD-10-CM | POA: Diagnosis not present

## 2019-01-29 DIAGNOSIS — Z6841 Body Mass Index (BMI) 40.0 and over, adult: Secondary | ICD-10-CM | POA: Diagnosis not present

## 2019-05-02 DIAGNOSIS — Z6841 Body Mass Index (BMI) 40.0 and over, adult: Secondary | ICD-10-CM | POA: Diagnosis not present

## 2019-05-02 DIAGNOSIS — Z299 Encounter for prophylactic measures, unspecified: Secondary | ICD-10-CM | POA: Diagnosis not present

## 2019-05-02 DIAGNOSIS — Z0000 Encounter for general adult medical examination without abnormal findings: Secondary | ICD-10-CM | POA: Diagnosis not present

## 2019-05-02 DIAGNOSIS — I10 Essential (primary) hypertension: Secondary | ICD-10-CM | POA: Diagnosis not present

## 2019-05-02 DIAGNOSIS — J449 Chronic obstructive pulmonary disease, unspecified: Secondary | ICD-10-CM | POA: Diagnosis not present

## 2019-05-02 DIAGNOSIS — Z1331 Encounter for screening for depression: Secondary | ICD-10-CM | POA: Diagnosis not present

## 2019-05-02 DIAGNOSIS — Z7189 Other specified counseling: Secondary | ICD-10-CM | POA: Diagnosis not present

## 2019-05-02 DIAGNOSIS — R5383 Other fatigue: Secondary | ICD-10-CM | POA: Diagnosis not present

## 2019-05-02 DIAGNOSIS — Z1339 Encounter for screening examination for other mental health and behavioral disorders: Secondary | ICD-10-CM | POA: Diagnosis not present

## 2019-05-02 DIAGNOSIS — Z1211 Encounter for screening for malignant neoplasm of colon: Secondary | ICD-10-CM | POA: Diagnosis not present

## 2019-05-04 DIAGNOSIS — Z79899 Other long term (current) drug therapy: Secondary | ICD-10-CM | POA: Diagnosis not present

## 2019-05-04 DIAGNOSIS — I10 Essential (primary) hypertension: Secondary | ICD-10-CM | POA: Diagnosis not present

## 2019-05-04 DIAGNOSIS — R202 Paresthesia of skin: Secondary | ICD-10-CM | POA: Diagnosis not present

## 2019-05-09 ENCOUNTER — Other Ambulatory Visit: Payer: Self-pay | Admitting: Cardiology

## 2019-05-09 ENCOUNTER — Encounter: Payer: Self-pay | Admitting: Gastroenterology

## 2019-05-09 DIAGNOSIS — J449 Chronic obstructive pulmonary disease, unspecified: Secondary | ICD-10-CM | POA: Diagnosis not present

## 2019-05-09 DIAGNOSIS — Z6841 Body Mass Index (BMI) 40.0 and over, adult: Secondary | ICD-10-CM | POA: Diagnosis not present

## 2019-05-09 DIAGNOSIS — Z299 Encounter for prophylactic measures, unspecified: Secondary | ICD-10-CM | POA: Diagnosis not present

## 2019-05-09 DIAGNOSIS — E1165 Type 2 diabetes mellitus with hyperglycemia: Secondary | ICD-10-CM | POA: Diagnosis not present

## 2019-05-09 DIAGNOSIS — I10 Essential (primary) hypertension: Secondary | ICD-10-CM | POA: Diagnosis not present

## 2019-05-17 ENCOUNTER — Other Ambulatory Visit: Payer: Self-pay | Admitting: Cardiology

## 2019-05-17 DIAGNOSIS — I5021 Acute systolic (congestive) heart failure: Secondary | ICD-10-CM

## 2019-05-17 DIAGNOSIS — I2510 Atherosclerotic heart disease of native coronary artery without angina pectoris: Secondary | ICD-10-CM

## 2019-05-17 DIAGNOSIS — I480 Paroxysmal atrial fibrillation: Secondary | ICD-10-CM

## 2019-05-25 ENCOUNTER — Other Ambulatory Visit: Payer: Self-pay | Admitting: Gastroenterology

## 2019-06-06 DIAGNOSIS — Z6841 Body Mass Index (BMI) 40.0 and over, adult: Secondary | ICD-10-CM | POA: Diagnosis not present

## 2019-06-06 DIAGNOSIS — Z299 Encounter for prophylactic measures, unspecified: Secondary | ICD-10-CM | POA: Diagnosis not present

## 2019-06-06 DIAGNOSIS — E1165 Type 2 diabetes mellitus with hyperglycemia: Secondary | ICD-10-CM | POA: Diagnosis not present

## 2019-06-06 DIAGNOSIS — J449 Chronic obstructive pulmonary disease, unspecified: Secondary | ICD-10-CM | POA: Diagnosis not present

## 2019-06-06 DIAGNOSIS — I10 Essential (primary) hypertension: Secondary | ICD-10-CM | POA: Diagnosis not present

## 2019-07-04 ENCOUNTER — Other Ambulatory Visit: Payer: Self-pay

## 2019-08-16 DIAGNOSIS — Z789 Other specified health status: Secondary | ICD-10-CM | POA: Diagnosis not present

## 2019-08-16 DIAGNOSIS — I10 Essential (primary) hypertension: Secondary | ICD-10-CM | POA: Diagnosis not present

## 2019-08-16 DIAGNOSIS — Z299 Encounter for prophylactic measures, unspecified: Secondary | ICD-10-CM | POA: Diagnosis not present

## 2019-08-16 DIAGNOSIS — J449 Chronic obstructive pulmonary disease, unspecified: Secondary | ICD-10-CM | POA: Diagnosis not present

## 2019-08-16 DIAGNOSIS — E1165 Type 2 diabetes mellitus with hyperglycemia: Secondary | ICD-10-CM | POA: Diagnosis not present

## 2019-08-16 DIAGNOSIS — Z6841 Body Mass Index (BMI) 40.0 and over, adult: Secondary | ICD-10-CM | POA: Diagnosis not present

## 2019-10-05 ENCOUNTER — Other Ambulatory Visit: Payer: Self-pay | Admitting: Cardiology

## 2019-10-16 DIAGNOSIS — Z23 Encounter for immunization: Secondary | ICD-10-CM | POA: Diagnosis not present

## 2019-11-09 ENCOUNTER — Other Ambulatory Visit: Payer: Self-pay | Admitting: Cardiology

## 2019-11-22 ENCOUNTER — Other Ambulatory Visit: Payer: Self-pay | Admitting: Cardiology

## 2019-11-22 DIAGNOSIS — I5021 Acute systolic (congestive) heart failure: Secondary | ICD-10-CM

## 2019-11-22 DIAGNOSIS — I480 Paroxysmal atrial fibrillation: Secondary | ICD-10-CM

## 2019-11-22 DIAGNOSIS — I2510 Atherosclerotic heart disease of native coronary artery without angina pectoris: Secondary | ICD-10-CM

## 2019-11-27 ENCOUNTER — Other Ambulatory Visit: Payer: Self-pay | Admitting: Cardiology

## 2019-11-27 DIAGNOSIS — I480 Paroxysmal atrial fibrillation: Secondary | ICD-10-CM

## 2019-11-27 DIAGNOSIS — I2510 Atherosclerotic heart disease of native coronary artery without angina pectoris: Secondary | ICD-10-CM

## 2019-11-27 DIAGNOSIS — I5021 Acute systolic (congestive) heart failure: Secondary | ICD-10-CM

## 2019-12-05 ENCOUNTER — Telehealth: Payer: Self-pay | Admitting: Cardiology

## 2019-12-05 NOTE — Telephone Encounter (Signed)
Virtual Visit Pre-Appointment Phone Call  "(Name), I am calling you today to discuss your upcoming appointment. We are currently trying to limit exposure to the virus that causes COVID-19 by seeing patients at home rather than in the office."  1. "What is the BEST phone number to call the day of the visit?" -  [phone]  2.  3. "Do you have or have access to (through a family member/friend) a smartphone with video capability that we can use for your visit?" a. If yes - list this number in appt notes as "cell" (if different from BEST phone #) and list the appointment type as a VIDEO visit in appointment notes b. If no - list the appointment type as a PHONE visit in appointment notes  4. Confirm consent - "In the setting of the current Covid19 crisis, you are scheduled for a (phone or video) visit with your provider on (date) at (time).  Just as we do with many in-office visits, in order for you to participate in this visit, we must obtain consent.  If you'd like, I can send this to your mychart (if signed up) or email for you to review.  Otherwise, I can obtain your verbal consent now.  All virtual visits are billed to your insurance company just like a normal visit would be.  By agreeing to a virtual visit, we'd like you to understand that the technology does not allow for your provider to perform an examination, and thus may limit your provider's ability to fully assess your condition. If your provider identifies any concerns that need to be evaluated in person, we will make arrangements to do so.  Finally, though the technology is pretty good, we cannot assure that it will always work on either your or our end, and in the setting of a video visit, we may have to convert it to a phone-only visit.  In either situation, we cannot ensure that we have a secure connection.  Are you willing to proceed?" STAFF: Did the patient verbally acknowledge consent to telehealth visit? Document YES/NO here: YES     5. Advise patient to be prepared - "Two hours prior to your appointment, go ahead and check your blood pressure, pulse, oxygen saturation, and your weight (if you have the equipment to check those) and write them all down. When your visit starts, your provider will ask you for this information. If you have an Apple Watch or Kardia device, please plan to have heart rate information ready on the day of your appointment. Please have a pen and paper handy nearby the day of the visit as well."  6. Give patient instructions for MyChart download to smartphone OR Doximity/Doxy.me as below if video visit (depending on what platform provider is using)  7. Inform patient they will receive a phone call 15 minutes prior to their appointment time (may be from unknown caller ID) so they should be prepared to answer    TELEPHONE CALL NOTE  PACEY BRACE has been deemed a candidate for a follow-up tele-health visit to limit community exposure during the Covid-19 pandemic. I spoke with the patient via phone to ensure availability of phone/video source, confirm preferred email & phone number, and discuss instructions and expectations.  I reminded Pamela Soto to be prepared with any vital sign and/or heart rhythm information that could potentially be obtained via home monitoring, at the time of her visit. I reminded Pamela Soto to expect a phone call prior  to her visit.  Pamela Soto 12/05/2019 10:02 AM   INSTRUCTIONS FOR DOWNLOADING THE MYCHART APP TO SMARTPHONE  - The patient must first make sure to have activated MyChart and know their login information - If Apple, go to [employer] and type in MyChart in the search bar and download the app. If Android, ask patient to go to [employer] and type in [facility]ter in the search bar and download the app. The app is free but as with any other app downloads, their phone may require them to verify saved payment information or Apple/Android password.  -  The patient will need to then log into the app with their MyChart username and password, and select [facility] as their healthcare provider to link the account. When it is time for your visit, go to the MyChart app, find appointments, and click Begin Video Visit. Be sure to Select Allow for your device to access the Microphone and Camera for your visit. You will then be connected, and your provider will be with you shortly.  **If they have any issues connecting, or need assistance please contact MyChart service desk [phone])**  **If using a computer, in order to ensure the best quality for their visit they will need to use either of the following Internet Browsers: [employer], or Google Chrome**  IF USING DOXIMITY or DOXY.ME - The patient will receive a link just prior to their visit by text.     FULL LENGTH CONSENT FOR TELE-HEALTH VISIT   I hereby voluntarily request, consent and authorize [facility] and its employed or contracted physicians, physician assistants, nurse practitioners or other licensed health care professionals (the Practitioner), to provide me with telemedicine health care services (the "Services") as deemed necessary by the treating Practitioner. I acknowledge and consent to receive the Services by the Practitioner via telemedicine. I understand that the telemedicine visit will involve communicating with the Practitioner through live audiovisual communication technology and the disclosure of certain medical information by electronic transmission. I acknowledge that I have been given the opportunity to request an in-person assessment or other available alternative prior to the telemedicine visit and am voluntarily participating in the telemedicine visit.  I understand that I have the right to withhold or withdraw my consent to the use of telemedicine in the course of my care at any time, without affecting my right to future care or treatment, and that the  Practitioner or I may terminate the telemedicine visit at any time. I understand that I have the right to inspect all information obtained and/or recorded in the course of the telemedicine visit and may receive copies of available information for a reasonable fee.  I understand that some of the potential risks of receiving the Services via telemedicine include:  [name] Delay or interruption in medical evaluation due to technological equipment failure or disruption; . Information transmitted may not be sufficient (e.g. poor resolution of images) to allow for appropriate medical decision making by the Practitioner; and/or  . In rare instances, security protocols could fail, causing a breach of personal health information.  Furthermore, I acknowledge that it is my responsibility to provide information about my medical history, conditions and care that is complete and accurate to the best of my ability. I acknowledge that Practitioner's advice, recommendations, and/or decision may be based on factors not within their control, such as incomplete or inaccurate data provided by me or distortions of diagnostic images or specimens that may result from electronic transmissions.  I understand that the practice of medicine is not an exact science and that Practitioner makes no warranties or guarantees regarding treatment outcomes. I acknowledge that I will receive a copy of this consent concurrently upon execution via email to the email address I last provided but may also request a printed copy by calling the office of [facility].    I understand that my insurance will be billed for this visit.   I have read or had this consent read to me. . I understand the contents of this consent, which adequately explains the benefits and risks of the Services being provided via telemedicine.  . I have been provided ample opportunity to ask questions regarding this consent and the Services and have had my questions answered to my  satisfaction. . I give my informed consent for the services to be provided through the use of telemedicine in my medical care  By participating in this telemedicine visit I agree to the above.

## 2019-12-11 ENCOUNTER — Encounter: Payer: Self-pay | Admitting: Family Medicine

## 2019-12-11 ENCOUNTER — Telehealth (INDEPENDENT_AMBULATORY_CARE_PROVIDER_SITE_OTHER): Payer: Medicare Other | Admitting: Family Medicine

## 2019-12-11 ENCOUNTER — Other Ambulatory Visit: Payer: Self-pay

## 2019-12-11 VITALS — Ht 65.0 in | Wt 269.0 lb

## 2019-12-11 DIAGNOSIS — Z7984 Long term (current) use of oral hypoglycemic drugs: Secondary | ICD-10-CM | POA: Diagnosis not present

## 2019-12-11 DIAGNOSIS — I2510 Atherosclerotic heart disease of native coronary artery without angina pectoris: Secondary | ICD-10-CM | POA: Diagnosis not present

## 2019-12-11 DIAGNOSIS — E782 Mixed hyperlipidemia: Secondary | ICD-10-CM | POA: Diagnosis not present

## 2019-12-11 DIAGNOSIS — I5021 Acute systolic (congestive) heart failure: Secondary | ICD-10-CM | POA: Diagnosis not present

## 2019-12-11 DIAGNOSIS — I480 Paroxysmal atrial fibrillation: Secondary | ICD-10-CM | POA: Diagnosis not present

## 2019-12-11 DIAGNOSIS — Z79899 Other long term (current) drug therapy: Secondary | ICD-10-CM

## 2019-12-11 NOTE — Patient Instructions (Signed)

## 2019-12-11 NOTE — Progress Notes (Signed)
Virtual Visit via Telephone Note   This visit type was conducted due to national recommendations for restrictions regarding the COVID-19 Pandemic (e.g. social distancing) in an effort to limit this patient's exposure and mitigate transmission in our community.  Due to her co-morbid illnesses, this patient is at least at moderate risk for complications without adequate follow up.  This format is felt to be most appropriate for this patient at this time.  The patient did not have access to video technology/had technical difficulties with video requiring transitioning to audio format only (telephone).  All issues noted in this document were discussed and addressed.  No physical exam could be performed with this format.  Please refer to the patient's chart for her  consent to telehealth for [facility].   Date:  12/11/2019   ID:  Pamela Soto, DOB February 04, 1951, MRN [id number]  Patient Location: Home Provider Location: Office  PCP:  Berenice Primas  Cardiologist:  Carlyle Dolly, MD   Electrophysiologist:  None   Evaluation Performed:  Follow-Up Visit  Chief Complaint: Follow-up  History of Present Illness:    Pamela Soto is a 69 y.o. female last seen by Dr. Harl Bowie December 18, 2018 with complaints of shortness of breath, mild chest pain, and wheezing.  At that time she was describing pressing feeling in chest with activity and dyspnea on exertion while walking in the store for a few minutes.  At that time she complained of symptoms lasting up to 3 to 4 hours.  Experiencing no orthopnea at that time.  Breathing was improved by using more of her breathing treatments.  She had Lasix available but had not needed it.  Cardiac catheterization in February 2018 she was noted to have significant decreased LV function with an EF of less than 25%.[name]  LVEDP was significantly elevated.  Hemodynamic findings were consistent with moderate pulmonary hypertension.  Patient had an episode of acute atrial  fibrillation during the catheterization.  Subsequent echocardiogram in August 2018 showed an improvement in ejection fraction of 50 to 55%.  Diastolic function was abnormal.  Patient had mild to moderate LVH.  Patient denies any recent acute illnesses, hospitalizations, surgeries, or travels.  Denies any exposure to Covid nor any symptoms.  Patient states she is doing well.  Blood pressure is within normal limits per her statement.  Had a recent visit with PCP with lab work which she stated was all normal.  Denies any progressive anginal or exertional symptoms, denies any palpitations or arrhythmias, syncopal or near syncopal episodes or orthopnea.  The patient does not have symptoms concerning for COVID-19 infection (fever, chills, cough, or new shortness of breath).    Past Medical History:  Diagnosis Date  . Acid reflux   . Hypertension   . Sleep apnea    Past Surgical History:  Procedure Laterality Date  . BIOPSY  09/13/2017   Procedure: BIOPSY;  Surgeon: Danie Binder, MD;  Location: [facility];  Service: Endoscopy;;  colon duodenum gastric  . BREAST BIOPSY Right    normal result  . CESAREAN SECTION    . CHOLECYSTECTOMY  11/06/2012   Procedure: LAPAROSCOPIC CHOLECYSTECTOMY WITH INTRAOPERATIVE CHOLANGIOGRAM;  Surgeon: Pedro Earls, MD;  Location: [facility];  Service: General;  Laterality: N/A;  . COLONOSCOPY N/A 09/13/2017   Procedure: COLONOSCOPY;  Surgeon: Danie Binder, MD;  [facility]: [facility];  Service: Endoscopy;  Laterality: N/A;  245  . ESOPHAGOGASTRODUODENOSCOPY N/A 09/13/2017   Procedure: ESOPHAGOGASTRODUODENOSCOPY (EGD);  Surgeon: Danie Binder, MD;  Location: [facility];  Service: Endoscopy;  Laterality: N/A;  . POLYPECTOMY  09/13/2017   Procedure: POLYPECTOMY;  Surgeon: Danie Binder, MD;  [facility]: [facility];  Service: Endoscopy;;  colon  . RIGHT/LEFT HEART CATH AND CORONARY ANGIOGRAPHY N/A 01/10/2017   Procedure: Right/Left Heart Cath and Coronary  Angiography;  Surgeon: Belva Crome, MD;  Location: [facility];  Service: Cardiovascular;  Laterality: N/A;  . UMBILICAL HERNIA REPAIR  11/06/2012   Procedure: HERNIA REPAIR UMBILICAL ADULT;  Surgeon: Pedro Earls, MD;  Location: [facility];  Service: General;;     Current Meds  Medication Sig  . acetaminophen (TYLENOL) 500 MG tablet Take 1 tablet (500 mg total) by mouth every 6 (six) hours as needed. (Patient taking differently: Take 500 mg by mouth every 6 (six) hours as needed for mild pain or moderate pain. )  . albuterol (PROVENTIL) (2.5 MG/3ML) 0.083% nebulizer solution Take 3 mLs (2.5 mg total) by nebulization every 6 (six) hours as needed for wheezing or shortness of breath.  [name] artificial tears (LACRILUBE) OINT ophthalmic ointment Use in left eye at night and tape eye shut  . atorvastatin (LIPITOR) 40 MG tablet TAKE ONE TABLET BY MOUTH DAILY  . bisoprolol (ZEBETA) 5 MG tablet TAKE ONE TABLET BY MOUTH EVERY DAY.  . busPIRone (BUSPAR) 7.5 MG tablet Take 1 tablet (7.5 mg total) by mouth every 8 (eight) hours as needed.  . Coenzyme Q10 (CO Q-10) 50 MG CAPS Take 1 capsule by mouth 2 (two) times daily after a meal. (Patient taking differently: Take 1 capsule by mouth as needed. )  . ELIQUIS 5 MG TABS tablet TAKE ONE TABLET BY MOUTH TWICE DAILY.  [name] Eye Patch MISC 1 application by Does not apply route daily.  . furosemide (LASIX) 40 MG tablet Take 1 tablet (40 mg total) by mouth daily as needed (swelling).  . hydroxypropyl methylcellulose / hypromellose (ISOPTO TEARS / GONIOVISC) 2.5 % ophthalmic solution Use in left eye during the day every 2-3 hours as needed for dry eye  . lisinopril (ZESTRIL) 5 MG tablet TAKE ONE TABLET BY MOUTH DAILY.  . Multiple Vitamins-Minerals (MULTIVITAMIN) tablet Take 1 tablet by mouth daily. Nature made brand  . pantoprazole (PROTONIX) 40 MG tablet TAKE ONE TABLET BY MOUTH 30 MINUTES PRIOR TO FIRST MEAL  . Polyethyl Glycol-Propyl Glycol (SYSTANE) 0.4-0.3 %  GEL ophthalmic gel Place 1 application into both eyes as needed.  . potassium chloride SA (KLOR-CON) 20 MEQ tablet TAKE ONE TABLET BY MOUTH DAILY     Allergies:   Patient has no known allergies.   Social History   Tobacco Use  . Smoking status: Never Smoker  . Smokeless tobacco: Never Used  Substance Use Topics  . Alcohol use: No  . Drug use: No     Family Hx: The patient's family history includes Asthma in her father; Cancer in her father and sister; Heart attack in her mother; Heart attack (age of onset: 43) in her brother; Heart disease (age of onset: 87) in her mother. There is no history of Colon cancer.  ROS:   Please see the history of present illness.    All other systems reviewed and are negative.   Prior CV studies:   The following studies were reviewed today:  01/2017 cath  Dist LAD lesion, 100 %stenosed.  Prox RCA lesion, 40 %stenosed.  3rd Mrg lesion, 40 %stenosed.  There is severe left ventricular systolic dysfunction.  The left ventricular ejection fraction is less than 25% by visual estimate.  LV end diastolic pressure is severely elevated.  Hemodynamic findings consistent with moderate pulmonary hypertension.  Acute atrial fibrillation developed on the cath table during right heart/right atrial pressure recording. Rate 118 bpm. Atrial fibrillation was treated with one bolus of IV amiodarone.  40% proximal RCA stenosis.  Tortuous but normal circumflex  100% distal/apical LAD stenosis. This lesion is probably chronic but difficult to determine age. There is also a possibility that it is acute embolic occlusion from a catheter-based thrombus. For that reason additional heparin and then Aggrastat bolus was given as the patient complained of chest pressure post procedure.  Chest pressure post procedure is similar to prior episodes.  Severe LV dysfunction with EF less than 25%. Contraction pattern appears global.  Arterial blood gas demonstrated  severe CO2 retention, pCO2 67.    01/2017 echo Study Conclusions  - Left ventricle: The cavity size was mildly dilated. Wall thickness was increased in a pattern of moderate LVH. Systolic function was moderately reduced. The estimated ejection fraction was in the range of 35% to 40%. Diffuse hypokinesis. - Aortic valve: Mildly calcified annulus. Trileaflet; mildly thickened leaflets. - Mitral valve: There was mild regurgitation. - Left atrium: The atrium was moderately dilated. - Atrial septum: The septum bowed from left to right, consistent with increased left atrial pressure. - Pulmonic valve: There was mild regurgitation.   07/2017 echo Study Conclusions  - Left ventricle: The cavity size was normal. Wall thickness was increased increased in a pattern of mild to moderate LVH. Systolic function was normal. The estimated ejection fraction was in the range of 50% to 55%. Diastolic function is abnormal, indeterminant grade. LVOT VTI and Vmax likely underestimated based on angle of acquisition. - Aortic valve: Valve area (VTI): 1.56 cm^2. Valve area (Vmax): 1.84 cm^2. - Technically difficult study.    Labs/Other Tests and Data Reviewed:    EKG:  An ECG dated 11/08/2018 was personally reviewed today and demonstrated:  Normal sinus rhythm rate of 64 possible pulmonary disease  Recent Labs: No results found for requested labs within last 8760 hours.  Lab work at PCP office July 24, 2018 hemoglobin 13.4, hematocrit 41.4, platelets 267, glucose 131, BUN 14, creatinine 1.04, GFR 56, sodium 145, AST 16, ALT 15,  Recent Lipid Panel Lab Results  Component Value Date/Time   CHOL 131 07/13/2017 12:00 PM   TRIG 108 07/13/2017 12:00 PM   HDL 48 07/13/2017 12:00 PM   CHOLHDL 2.7 07/13/2017 12:00 PM   LDLCALC 61 07/13/2017 12:00 PM    Wt Readings from Last 3 Encounters:  12/11/19 269 lb (122 kg)  12/18/18 252 lb 12.8 oz (114.7 kg)  11/08/18 258 lb  3.2 oz (117.1 kg)     Objective:    Vital Signs:  Ht 5\' 5"  (1.651 m)   Wt 269 lb (122 kg)   BMI 44.76 kg/m    Patient speech pattern is normal.  No dyspnea, wheezing, or cough noted  ASSESSMENT & PLAN:    1. Acute systolic heart failure (HCC) EF significantly improved on last echocardiogram to 50 to 55%.  Patient had mild to moderate LVH, and diastolic function was abnormal.  Continue Lasix 40 mg as needed.  Patient states only occasionally uses Lasix.  2. Paroxysmal atrial fibrillation [facility]) Patient voices no episodes of irregular heart rates, fluttering, or racing heart since last visit.  Continue bisoprolol 5 mg daily.  Continue Eliquis 5 mg twice daily.  3. Coronary artery disease involving native coronary artery of native heart without angina pectoris Denies any progressive anginal or exertional symptoms.  Cath report as above in February 2018.  4. Mixed hyperlipidemia Patient on moderate intensity statin therapy.  Continue Lipitor 40 mg daily.  COVID-19 Education: The signs and symptoms of COVID-19 were discussed with the patient and how to seek care for testing (follow up with PCP or arrange E-visit).  The importance of social distancing was discussed today.  Time:   Today, I have spent 15 minutes with the patient with telehealth technology discussing the above problems.     Medication Adjustments/Labs and Tests Ordered: Current medicines are reviewed at length with the patient today.  Concerns regarding medicines are outlined above.   Tests Ordered: No orders of the defined types were placed in this encounter.   Medication Changes: No orders of the defined types were placed in this encounter.   Follow Up:  Either In Person or Virtual in 1 year(s)  Signed, Verta Ellen, NP  12/11/2019 8:27 AM    [place]

## 2019-12-25 DIAGNOSIS — E1140 Type 2 diabetes mellitus with diabetic neuropathy, unspecified: Secondary | ICD-10-CM | POA: Diagnosis not present

## 2019-12-25 DIAGNOSIS — E1159 Type 2 diabetes mellitus with other circulatory complications: Secondary | ICD-10-CM | POA: Diagnosis not present

## 2019-12-26 DIAGNOSIS — Z6841 Body Mass Index (BMI) 40.0 and over, adult: Secondary | ICD-10-CM | POA: Diagnosis not present

## 2019-12-26 DIAGNOSIS — Z789 Other specified health status: Secondary | ICD-10-CM | POA: Diagnosis not present

## 2019-12-26 DIAGNOSIS — I10 Essential (primary) hypertension: Secondary | ICD-10-CM | POA: Diagnosis not present

## 2019-12-26 DIAGNOSIS — Z299 Encounter for prophylactic measures, unspecified: Secondary | ICD-10-CM | POA: Diagnosis not present

## 2019-12-26 DIAGNOSIS — N907 Vulvar cyst: Secondary | ICD-10-CM | POA: Diagnosis not present

## 2019-12-26 DIAGNOSIS — J449 Chronic obstructive pulmonary disease, unspecified: Secondary | ICD-10-CM | POA: Diagnosis not present

## 2019-12-27 DIAGNOSIS — N907 Vulvar cyst: Secondary | ICD-10-CM | POA: Diagnosis not present

## 2019-12-28 DIAGNOSIS — Z7901 Long term (current) use of anticoagulants: Secondary | ICD-10-CM | POA: Diagnosis not present

## 2019-12-28 DIAGNOSIS — E7800 Pure hypercholesterolemia, unspecified: Secondary | ICD-10-CM | POA: Diagnosis not present

## 2019-12-28 DIAGNOSIS — K219 Gastro-esophageal reflux disease without esophagitis: Secondary | ICD-10-CM | POA: Diagnosis not present

## 2019-12-28 DIAGNOSIS — N907 Vulvar cyst: Secondary | ICD-10-CM | POA: Diagnosis not present

## 2019-12-28 DIAGNOSIS — I2510 Atherosclerotic heart disease of native coronary artery without angina pectoris: Secondary | ICD-10-CM | POA: Diagnosis not present

## 2019-12-28 DIAGNOSIS — I4891 Unspecified atrial fibrillation: Secondary | ICD-10-CM | POA: Diagnosis not present

## 2019-12-28 DIAGNOSIS — Z20822 Contact with and (suspected) exposure to covid-19: Secondary | ICD-10-CM | POA: Diagnosis not present

## 2019-12-28 DIAGNOSIS — L720 Epidermal cyst: Secondary | ICD-10-CM | POA: Diagnosis not present

## 2019-12-28 DIAGNOSIS — N762 Acute vulvitis: Secondary | ICD-10-CM | POA: Diagnosis not present

## 2019-12-28 DIAGNOSIS — Z7984 Long term (current) use of oral hypoglycemic drugs: Secondary | ICD-10-CM | POA: Diagnosis not present

## 2019-12-28 DIAGNOSIS — Z6841 Body Mass Index (BMI) 40.0 and over, adult: Secondary | ICD-10-CM | POA: Diagnosis not present

## 2019-12-28 DIAGNOSIS — E119 Type 2 diabetes mellitus without complications: Secondary | ICD-10-CM | POA: Diagnosis not present

## 2019-12-28 DIAGNOSIS — I110 Hypertensive heart disease with heart failure: Secondary | ICD-10-CM | POA: Diagnosis not present

## 2019-12-28 DIAGNOSIS — E669 Obesity, unspecified: Secondary | ICD-10-CM | POA: Diagnosis not present

## 2019-12-28 DIAGNOSIS — G4730 Sleep apnea, unspecified: Secondary | ICD-10-CM | POA: Diagnosis not present

## 2019-12-28 DIAGNOSIS — I5021 Acute systolic (congestive) heart failure: Secondary | ICD-10-CM | POA: Diagnosis not present

## 2019-12-28 DIAGNOSIS — G4733 Obstructive sleep apnea (adult) (pediatric): Secondary | ICD-10-CM | POA: Diagnosis not present

## 2019-12-28 DIAGNOSIS — Z79899 Other long term (current) drug therapy: Secondary | ICD-10-CM | POA: Diagnosis not present

## 2020-01-14 DIAGNOSIS — Z09 Encounter for follow-up examination after completed treatment for conditions other than malignant neoplasm: Secondary | ICD-10-CM | POA: Diagnosis not present

## 2020-01-15 DIAGNOSIS — I10 Essential (primary) hypertension: Secondary | ICD-10-CM | POA: Diagnosis not present

## 2020-01-15 DIAGNOSIS — Z299 Encounter for prophylactic measures, unspecified: Secondary | ICD-10-CM | POA: Diagnosis not present

## 2020-01-15 DIAGNOSIS — Z6841 Body Mass Index (BMI) 40.0 and over, adult: Secondary | ICD-10-CM | POA: Diagnosis not present

## 2020-01-15 DIAGNOSIS — E1165 Type 2 diabetes mellitus with hyperglycemia: Secondary | ICD-10-CM | POA: Diagnosis not present

## 2020-01-15 DIAGNOSIS — J449 Chronic obstructive pulmonary disease, unspecified: Secondary | ICD-10-CM | POA: Diagnosis not present

## 2020-01-30 DIAGNOSIS — Z23 Encounter for immunization: Secondary | ICD-10-CM | POA: Diagnosis not present

## 2020-02-13 DIAGNOSIS — J069 Acute upper respiratory infection, unspecified: Secondary | ICD-10-CM | POA: Diagnosis not present

## 2020-02-13 DIAGNOSIS — E1165 Type 2 diabetes mellitus with hyperglycemia: Secondary | ICD-10-CM | POA: Diagnosis not present

## 2020-02-13 DIAGNOSIS — J449 Chronic obstructive pulmonary disease, unspecified: Secondary | ICD-10-CM | POA: Diagnosis not present

## 2020-02-13 DIAGNOSIS — M461 Sacroiliitis, not elsewhere classified: Secondary | ICD-10-CM | POA: Diagnosis not present

## 2020-02-13 DIAGNOSIS — Z299 Encounter for prophylactic measures, unspecified: Secondary | ICD-10-CM | POA: Diagnosis not present

## 2020-02-27 DIAGNOSIS — Z23 Encounter for immunization: Secondary | ICD-10-CM | POA: Diagnosis not present

## 2020-02-28 DIAGNOSIS — E1165 Type 2 diabetes mellitus with hyperglycemia: Secondary | ICD-10-CM | POA: Diagnosis not present

## 2020-02-28 DIAGNOSIS — J449 Chronic obstructive pulmonary disease, unspecified: Secondary | ICD-10-CM | POA: Diagnosis not present

## 2020-02-28 DIAGNOSIS — Z6841 Body Mass Index (BMI) 40.0 and over, adult: Secondary | ICD-10-CM | POA: Diagnosis not present

## 2020-02-28 DIAGNOSIS — Z299 Encounter for prophylactic measures, unspecified: Secondary | ICD-10-CM | POA: Diagnosis not present

## 2020-02-28 DIAGNOSIS — I2510 Atherosclerotic heart disease of native coronary artery without angina pectoris: Secondary | ICD-10-CM | POA: Diagnosis not present

## 2020-02-28 DIAGNOSIS — I10 Essential (primary) hypertension: Secondary | ICD-10-CM | POA: Diagnosis not present

## 2020-03-12 ENCOUNTER — Other Ambulatory Visit: Payer: Self-pay | Admitting: *Deleted

## 2020-03-12 DIAGNOSIS — I2510 Atherosclerotic heart disease of native coronary artery without angina pectoris: Secondary | ICD-10-CM

## 2020-03-12 DIAGNOSIS — I5021 Acute systolic (congestive) heart failure: Secondary | ICD-10-CM

## 2020-03-12 DIAGNOSIS — I480 Paroxysmal atrial fibrillation: Secondary | ICD-10-CM

## 2020-03-12 MED ORDER — BISOPROLOL FUMARATE 5 MG PO TABS
5.0000 mg | ORAL_TABLET | Freq: Every day | ORAL | 1 refills | Status: DC
Start: 2020-03-12 — End: 2020-09-26

## 2020-05-07 DIAGNOSIS — Z7189 Other specified counseling: Secondary | ICD-10-CM | POA: Diagnosis not present

## 2020-05-07 DIAGNOSIS — E1165 Type 2 diabetes mellitus with hyperglycemia: Secondary | ICD-10-CM | POA: Diagnosis not present

## 2020-05-07 DIAGNOSIS — Z1331 Encounter for screening for depression: Secondary | ICD-10-CM | POA: Diagnosis not present

## 2020-05-07 DIAGNOSIS — Z0000 Encounter for general adult medical examination without abnormal findings: Secondary | ICD-10-CM | POA: Diagnosis not present

## 2020-05-07 DIAGNOSIS — Z1339 Encounter for screening examination for other mental health and behavioral disorders: Secondary | ICD-10-CM | POA: Diagnosis not present

## 2020-05-07 DIAGNOSIS — Z1211 Encounter for screening for malignant neoplasm of colon: Secondary | ICD-10-CM | POA: Diagnosis not present

## 2020-05-07 DIAGNOSIS — Z6841 Body Mass Index (BMI) 40.0 and over, adult: Secondary | ICD-10-CM | POA: Diagnosis not present

## 2020-05-07 DIAGNOSIS — I10 Essential (primary) hypertension: Secondary | ICD-10-CM | POA: Diagnosis not present

## 2020-05-07 DIAGNOSIS — R5383 Other fatigue: Secondary | ICD-10-CM | POA: Diagnosis not present

## 2020-05-07 DIAGNOSIS — Z299 Encounter for prophylactic measures, unspecified: Secondary | ICD-10-CM | POA: Diagnosis not present

## 2020-05-12 ENCOUNTER — Other Ambulatory Visit: Payer: Self-pay | Admitting: Cardiology

## 2020-07-17 ENCOUNTER — Encounter: Payer: Self-pay | Admitting: Internal Medicine

## 2020-07-17 ENCOUNTER — Other Ambulatory Visit: Payer: Self-pay

## 2020-07-17 ENCOUNTER — Ambulatory Visit (INDEPENDENT_AMBULATORY_CARE_PROVIDER_SITE_OTHER): Payer: Medicare Other | Admitting: Internal Medicine

## 2020-07-17 VITALS — BP 152/84 | HR 73 | Temp 99.0°F | Ht 65.5 in | Wt 245.4 lb

## 2020-07-17 DIAGNOSIS — J441 Chronic obstructive pulmonary disease with (acute) exacerbation: Secondary | ICD-10-CM

## 2020-07-17 DIAGNOSIS — I2510 Atherosclerotic heart disease of native coronary artery without angina pectoris: Secondary | ICD-10-CM

## 2020-07-17 DIAGNOSIS — R0609 Other forms of dyspnea: Secondary | ICD-10-CM

## 2020-07-17 DIAGNOSIS — J4530 Mild persistent asthma, uncomplicated: Secondary | ICD-10-CM | POA: Diagnosis not present

## 2020-07-17 DIAGNOSIS — R0600 Dyspnea, unspecified: Secondary | ICD-10-CM

## 2020-07-17 NOTE — Assessment & Plan Note (Signed)
Never smoker - PFT's  4/418 @ wt 254   FEV1 1.32 (62 % ) ratio 0.76  p 18 % improvement from saba p albuterol prior to study with DLCO  17.75 (67%) corrects to 6.3 (127%)  for alv volume and FV curve min concavity and ERV 34%   07/17/2020  After extensive coaching inhaler device,  effectiveness =    95% with DPI so ok to continue trelegy pending pfts before and saba but likely only needs Breo 100 for the asthmatic component

## 2020-07-17 NOTE — Patient Instructions (Signed)
Trelegy one click each am first thing and then blow out thru nose - stop it after a month if not convinced    Please schedule a follow up office visit in 6 - 8 weeks with PFTs and same day ov , call sooner if needed  - do not use the Trelegy that morning

## 2020-07-17 NOTE — Assessment & Plan Note (Addendum)
pfts restrictive with ERV 34%  03/2017   Body mass index is 40.22 kg/m.  -  trending downper pt  Lab Results  Component Value Date   TSH 2.186 07/13/2017     Contributing to gerd risk/ doe/reviewed the need and the process to achieve and maintain neg calorie balance > defer f/u primary care including intermittently monitoring thyroid status            Each maintenance medication was reviewed in detail including emphasizing most importantly the difference between maintenance and prns and under what circumstances the prns are to be triggered using an action plan format where appropriate.  Total time for H and P, chart review, counseling,  directly observing portions of ambulatory 02 saturation study/  and generating customized AVS unique to this office visit / charting = 48 min

## 2020-07-17 NOTE — Progress Notes (Signed)
Pamela Soto, female    DOB: 1951-03-23, 69 y.o.   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 Consult    shortness of breath with exertion  Dyspnea:  Started walking at  [employer] a few weeks ago p lost wt still struggle to do more than one aisle due to sob  Cough: none  Sleep: 2 pillows/ cpap and 02  SABA use: none now  02 hs ? Flow/ not using daytime   No obvious day to day or daytime variability or assoc excess/ purulent sputum or mucus plugs or hemoptysis or cp or chest tightness, subjective wheeze or overt sinus or hb symptoms.   Sleeping as above without nocturnal  or early am exacerbation  of respiratory  c/o's or need for noct saba. Also denies any obvious fluctuation of symptoms with weather or environmental changes or other aggravating or alleviating factors except as outlined above   No unusual exposure hx or h/o childhood pna/ asthma or knowledge of premature birth.  Current Allergies, Complete Past Medical History, Past Surgical History, Family History, and Social History were reviewed in [employer] record.  ROS  The following are not active complaints unless bolded Hoarseness, sore throat, dysphagia, dental problems, itching, sneezing,  nasal congestion or discharge of excess mucus or purulent secretions, ear ache,   fever, chills, sweats, unintended wt loss or wt gain, classically pleuritic or exertional cp,  orthopnea pnd or arm/hand swelling  or leg  swelling, presyncope, palpitations, abdominal pain, anorexia, nausea, vomiting, diarrhea  or change in bowel habits or change in bladder habits, change in stools or change in urine, dysuria, hematuria,  rash, arthralgias, visual complaints, headache, numbness, weakness or ataxia or problems with walking or coordination,  change in mood or  memory.           Past Medical History:  Diagnosis Date  . Acid reflux   . Hypertension   . Sleep apnea     Outpatient Medications Prior to Visit  Medication Sig Dispense Refill  .     12  . atorvastatin (LIPITOR) 40 MG tablet TAKE ONE TABLET BY MOUTH DAILY 90 tablet 3  . bisoprolol (ZEBETA) 5 MG tablet Take 1 tablet (5 mg total) by mouth daily. 90 tablet 1  . cyclobenzaprine (FLEXERIL) 10 MG tablet Take 10 mg by mouth at bedtime.    [name] ELIQUIS 5 MG TABS tablet TAKE ONE TABLET BY MOUTH TWICE DAILY. 60 tablet 6  . Fluticasone-Umeclidin-Vilant (TRELEGY ELLIPTA) 200-62.5-25 MCG/INH AEPB Inhale 1 puff into the lungs daily.    . furosemide (LASIX) 40 MG tablet Take 1 tablet (40 mg total) by mouth daily as needed (swelling). 30 tablet 3  . lisinopril (ZESTRIL) 5 MG tablet TAKE ONE TABLET BY MOUTH DAILY. 90 tablet 3  . metFORMIN (GLUCOPHAGE) 500 MG tablet Take by mouth 2 (two) times daily with a meal.    . Multiple Vitamins-Minerals (MULTIVITAMIN)  tablet Take 1 tablet by mouth daily. Petra Kuba made brand 30 tablet   . pantoprazole (PROTONIX) 40 MG tablet TAKE ONE TABLET BY MOUTH 30 MINUTES PRIOR TO FIRST MEAL 90 tablet 3  . potassium chloride SA (KLOR-CON) 20 MEQ tablet Take 1 tablet (20 mEq total) by mouth daily. 90 tablet 2  . acetaminophen (TYLENOL) 500 MG tablet Take 1 tablet (500 mg total) by mouth every 6 (six) hours as needed. (Patient not taking: Reported on 07/17/2020) 30 tablet 0  . artificial tears (LACRILUBE) OINT ophthalmic ointment Use in left eye at night and tape eye shut (Patient not taking: Reported on 07/17/2020) 1 Tube 1  . busPIRone (BUSPAR) 7.5  MG tablet Take 1 tablet (7.5 mg total) by mouth every 8 (eight) hours as needed. (Patient not taking: Reported on 07/17/2020) 90 tablet 0  . Coenzyme Q10 (CO Q-10) 50 MG CAPS Take 1 capsule by mouth 2 (two) times daily after a meal. (Patient not taking: Reported on 07/17/2020) 60 each 0  . Eye Patch MISC 1 application by Does not apply route daily. (Patient not taking: Reported on 07/17/2020)    . hydroxypropyl methylcellulose / hypromellose (ISOPTO TEARS / GONIOVISC) 2.5 % ophthalmic solution Use in left eye during the day every 2-3 hours as needed for dry eye (Patient not taking: Reported on 07/17/2020) 15 mL 12  . Polyethyl Glycol-Propyl Glycol (SYSTANE) 0.4-0.3 % GEL ophthalmic gel Place 1 application into both eyes as needed. (Patient not taking: Reported on 07/17/2020) 10 mL 5      Objective:     BP (!) 152/84 (BP Location: Left Arm, Cuff Size: Normal)   Pulse 73   Temp 99 F (37.2 C) (Oral)   Ht 5' 5.5" (1.664 m)   Wt 245 lb 6.4 oz (111.3 kg)   SpO2 93% Comment: Room air  BMI 40.22 kg/m   SpO2: 93 % (Room air)   Obese somber bf nad    HEENT : pt wearing mask not removed for exam due to covid -19 concerns.    NECK :  without JVD/Nodes/TM/ nl carotid upstrokes bilaterally   LUNGS: no acc muscle use,  Nl contour chest which is clear to A and P bilaterally without cough on insp or exp maneuvers   CV:  RRR  no s3 or murmur or increase in P2, and no edema   ABD:  soft and nontender with nl inspiratory excursion in the supine position. No bruits or organomegaly appreciated, bowel sounds nl  MS:  Nl gait/ ext warm without deformities, calf tenderness, cyanosis or clubbing No obvious joint restrictions   SKIN: warm and dry without lesions    NEURO:  alert, approp, nl sensorium with  no motor or cerebellar deficits apparent.       Assessment   Chronic asthma, mild persistent, uncomplicated Never smoker - PFT's  4/418 @ wt 254   FEV1 1.32 (62 % ) ratio 0.76  p 18 %  improvement from saba p albuterol prior to study with DLCO  17.75 (67%) corrects to 6.3 (127%)  for alv volume and FV curve min concavity and ERV 34%   07/17/2020  After extensive coaching inhaler device,  effectiveness =    95% with DPI so ok to continue trelegy pending pfts before and saba but likely only needs Breo 100 for the asthmatic component     DOE (dyspnea on exertion) Onset 2000 with pna/ chf/ wt gain - Echo 09/13/17  Mild / mod LVH with nl LA -  07/17/2020   Walked RA  approx   600 ft  @ moderte to fast pace  stopped due to  End of study though sob at end and sats 93%    - PFTs 03/09/17 restrictive with minimal asthma component and ERV 34%  Clearly not copd nor any form of chf that can't be treated.   >>> rec paced exercise/ wt loss and f/u with new set pfts     Morbid obesity due to excess calories ([place]) pfts restrictive with ERV 34%  03/2017   Body mass index is 40.22 kg/m.  -  trending downper pt  Lab Results  Component Value Date   TSH 2.186 07/13/2017     Contributing to gerd risk/ doe/reviewed the need and the process to achieve and maintain neg calorie balance > defer f/u primary care including intermittently monitoring thyroid status     Each maintenance medication was reviewed in detail including emphasizing most importantly the difference between maintenance and prns and under what circumstances the prns are to be triggered using an action plan format where appropriate.  Total time for H and P, chart review, counseling,  directly observing portions of ambulatory 02 saturation study/  and generating customized AVS unique to this office visit / charting = 48 min       Christinia Gully, MD 07/17/2020

## 2020-07-17 NOTE — Assessment & Plan Note (Signed)
Onset 2000 with pna/ chf/ wt gain - Echo 09/13/17  Mild / mod LVH with nl LA -  07/17/2020   Walked RA  approx   600 ft  @ moderte to fast pace  stopped due to  End of study though sob at end and sats 93%    - PFTs 03/09/17 restrictive with minimal asthma component and ERV 34%  Clearly not copd nor any form of chf that can't be treated.   >>> rec paced exercise/ wt loss and f/u with new set pfts

## 2020-07-29 ENCOUNTER — Other Ambulatory Visit: Payer: Self-pay | Admitting: Cardiology

## 2020-07-29 DIAGNOSIS — R609 Edema, unspecified: Secondary | ICD-10-CM

## 2020-07-29 DIAGNOSIS — I5022 Chronic systolic (congestive) heart failure: Secondary | ICD-10-CM

## 2020-09-04 ENCOUNTER — Other Ambulatory Visit: Payer: Self-pay | Admitting: Cardiology

## 2020-09-04 DIAGNOSIS — I2510 Atherosclerotic heart disease of native coronary artery without angina pectoris: Secondary | ICD-10-CM

## 2020-09-04 DIAGNOSIS — I5021 Acute systolic (congestive) heart failure: Secondary | ICD-10-CM

## 2020-09-05 ENCOUNTER — Other Ambulatory Visit (HOSPITAL_COMMUNITY)
Admission: RE | Admit: 2020-09-05 | Discharge: 2020-09-05 | Disposition: A | Discharge status: Home / Self Care | Payer: Medicare Other | Source: Ambulatory Visit | Attending: Internal Medicine | Admitting: Internal Medicine

## 2020-09-05 ENCOUNTER — Other Ambulatory Visit: Payer: Self-pay

## 2020-09-05 DIAGNOSIS — Z20822 Contact with and (suspected) exposure to covid-19: Secondary | ICD-10-CM | POA: Insufficient documentation

## 2020-09-05 DIAGNOSIS — Z01812 Encounter for preprocedural laboratory examination: Secondary | ICD-10-CM | POA: Insufficient documentation

## 2020-09-05 LAB — SARS CORONAVIRUS 2 (TAT 6-24 HRS): SARS Coronavirus 2: NEGATIVE

## 2020-09-09 ENCOUNTER — Other Ambulatory Visit: Payer: Self-pay

## 2020-09-09 ENCOUNTER — Ambulatory Visit (HOSPITAL_COMMUNITY)
Admission: RE | Admit: 2020-09-09 | Discharge: 2020-09-09 | Disposition: A | Discharge status: Home / Self Care | Payer: Medicare Other | Source: Ambulatory Visit | Attending: Internal Medicine | Admitting: Internal Medicine

## 2020-09-09 DIAGNOSIS — J441 Chronic obstructive pulmonary disease with (acute) exacerbation: Secondary | ICD-10-CM | POA: Diagnosis not present

## 2020-09-09 LAB — PULMONARY FUNCTION TEST
DL/VA % pred: 133 %
DL/VA: 5.48 ml/min/mmHg/L
DLCO unc % pred: 79 %
DLCO unc: 16.47 ml/min/mmHg
FEF 25-75 Post: 1.56 L/sec
FEF 25-75 Pre: 0.81 L/sec
FEF2575-%Change-Post: 91 %
FEF2575-%Pred-Post: 83 %
FEF2575-%Pred-Pre: 43 %
FEV1-%Change-Post: 21 %
FEV1-%Pred-Post: 65 %
FEV1-%Pred-Pre: 54 %
FEV1-Post: 1.32 L
FEV1-Pre: 1.09 L
FEV1FVC-%Change-Post: 1 %
FEV1FVC-%Pred-Pre: 96 %
FEV6-%Change-Post: 18 %
FEV6-%Pred-Post: 68 %
FEV6-%Pred-Pre: 58 %
FEV6-Post: 1.73 L
FEV6-Pre: 1.46 L
FEV6FVC-%Change-Post: 0 %
FEV6FVC-%Pred-Post: 103 %
FEV6FVC-%Pred-Pre: 103 %
FVC-%Change-Post: 18 %
FVC-%Pred-Post: 66 %
FVC-%Pred-Pre: 56 %
FVC-Post: 1.74 L
FVC-Pre: 1.46 L
Post FEV1/FVC ratio: 76 %
Post FEV6/FVC ratio: 99 %
Pre FEV1/FVC ratio: 75 %
Pre FEV6/FVC Ratio: 100 %
RV % pred: 125 %
RV: 2.79 L
TLC % pred: 84 %
TLC: 4.46 L

## 2020-09-09 MED ORDER — ALBUTEROL SULFATE (2.5 MG/3ML) 0.083% IN NEBU
2.5000 mg | INHALATION_SOLUTION | Freq: Once | RESPIRATORY_TRACT | Status: AC
  Administered 2020-09-09: 2.5 mg via RESPIRATORY_TRACT

## 2020-09-10 ENCOUNTER — Telehealth: Payer: Self-pay | Admitting: Internal Medicine

## 2020-09-10 NOTE — Telephone Encounter (Signed)
ATC patient to let her know that Dr. Melvyn Novas said that he is fine with patient coming in tomorrow for appointment per DPR left detailed message letting her know that she can come in for her appointment. Nothing further needed at this time

## 2020-09-11 ENCOUNTER — Other Ambulatory Visit: Payer: Self-pay

## 2020-09-11 ENCOUNTER — Encounter: Payer: Self-pay | Admitting: Internal Medicine

## 2020-09-11 ENCOUNTER — Ambulatory Visit (INDEPENDENT_AMBULATORY_CARE_PROVIDER_SITE_OTHER): Payer: Medicare Other | Admitting: Internal Medicine

## 2020-09-11 DIAGNOSIS — J449 Chronic obstructive pulmonary disease, unspecified: Secondary | ICD-10-CM | POA: Diagnosis not present

## 2020-09-11 DIAGNOSIS — R0600 Dyspnea, unspecified: Secondary | ICD-10-CM

## 2020-09-11 DIAGNOSIS — I2510 Atherosclerotic heart disease of native coronary artery without angina pectoris: Secondary | ICD-10-CM | POA: Diagnosis not present

## 2020-09-11 DIAGNOSIS — E1165 Type 2 diabetes mellitus with hyperglycemia: Secondary | ICD-10-CM | POA: Diagnosis not present

## 2020-09-11 DIAGNOSIS — Z299 Encounter for prophylactic measures, unspecified: Secondary | ICD-10-CM | POA: Diagnosis not present

## 2020-09-11 DIAGNOSIS — J4530 Mild persistent asthma, uncomplicated: Secondary | ICD-10-CM | POA: Diagnosis not present

## 2020-09-11 DIAGNOSIS — I10 Essential (primary) hypertension: Secondary | ICD-10-CM | POA: Diagnosis not present

## 2020-09-11 DIAGNOSIS — R0609 Other forms of dyspnea: Secondary | ICD-10-CM

## 2020-09-11 DIAGNOSIS — Z6841 Body Mass Index (BMI) 40.0 and over, adult: Secondary | ICD-10-CM | POA: Diagnosis not present

## 2020-09-11 DIAGNOSIS — J45909 Unspecified asthma, uncomplicated: Secondary | ICD-10-CM | POA: Diagnosis not present

## 2020-09-11 MED ORDER — TELMISARTAN 40 MG PO TABS: 40.0000 mg | ORAL_TABLET | Freq: Every day | ORAL | 11 refills | Status: DC

## 2020-09-11 NOTE — Patient Instructions (Addendum)
Stop lisinopril and your drainage, throat tickle, cough should gradually improve over the next couple of weeks   micardis (ibesartan)  40 mg  one half each am     To get the most out of exercise, you need to be continuously aware that you are short of breath, but never out of breath, for 30 minutes daily. As you improve, it will actually be easier for you to do the same amount of exercise  in  30 minutes so always push to the level where you are short of breath.      If you are satisfied with your treatment plan,  let your doctor know and he/she can either refill your medications or you can return here when your prescription runs out.     If in any way you are not 100% satisfied,  please tell us.  If 100% better, tell your friends!  Pulmonary follow up is as needed

## 2020-09-11 NOTE — Progress Notes (Signed)
Pamela Soto, female    DOB: August 21, 1951, 69 y.o.   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 Consult    shortness of breath with exertion  Dyspnea:  Started walking at  [employer] a few weeks ago p lost wt still struggle to do more than one aisle due to sob  Cough: none  Sleep: 2 pillows/ cpap and 02  SABA use: none now  02 hs ? Flow/ not using daytime at all rec Trelegy one click each am first thing and then blow out thru nose - stop it after a month if not convinced  Please schedule a follow up office visit in 6 - 8 weeks with PFTs and same day ov , call sooner if needed  - do not use the Trelegy that morning    09/11/2020  f/u ov/[place] office/Pamela Soto re:  Pamela Soto with minimal asthma / no change off trelegy  Chief Complaint  Patient presents with  . Follow-up    PFT's done 09/09/20.  Pt c/o PND and prod cough with clear sputum. She has not used her albuterol neb.     Dyspnea:  Shopping at [employer] now  Cough: urge to clear throat/ sense of pnds s other evidence of obvious sinus dz on ACi  Sleeping: sleeps on side/ bed is flat and one pillow SABA use: has neb, not recently used it  02: hs   No obvious day to day or daytime variability or assoc excess/ purulent sputum or mucus plugs or hemoptysis or cp or chest tightness, subjective wheeze or overt sinus or hb symptoms.   Sleeping as above without nocturnal  or early am exacerbation  of respiratory  c/o's or need for  noct saba. Also denies any obvious fluctuation of symptoms with weather or environmental changes or other aggravating or alleviating factors except as outlined above   No unusual exposure hx or h/o childhood pna/ asthma or knowledge of premature birth.  Current Allergies, Complete Past Medical History, Past Surgical History, Family History, and Social History were reviewed in [employer] record.  ROS  The following are not active complaints unless bolded Hoarseness, sore throat, dysphagia, dental problems, itching, sneezing,  nasal congestion or discharge of excess mucus or purulent secretions, ear ache,   fever, chills, sweats, unintended wt loss or wt gain, classically pleuritic or exertional cp,  orthopnea pnd or arm/hand swelling  or leg swelling, presyncope, palpitations, abdominal pain, anorexia, nausea, vomiting, diarrhea  or change in bowel habits or change in bladder habits, change in stools or change in urine, dysuria, hematuria,  rash, arthralgias, visual complaints, headache, numbness, weakness or ataxia or problems with walking or coordination,  change in mood or  memory.        Current Meds  Medication Sig  . albuterol (PROVENTIL) (2.5 MG/3ML) 0.083% nebulizer solution Take 3 mLs (2.5 mg total) by nebulization every 6 (six) hours  as needed for wheezing or shortness of breath.  [name] atorvastatin (LIPITOR) 40 MG tablet TAKE ONE TABLET BY MOUTH DAILY  . bisoprolol (ZEBETA) 5 MG tablet Take 1 tablet (5 mg total) by mouth daily.  . cyclobenzaprine (FLEXERIL) 10 MG tablet Take 10 mg by mouth at bedtime.  [name] ELIQUIS 5 MG TABS tablet TAKE ONE TABLET BY MOUTH TWICE DAILY.  [name] Fluticasone-Umeclidin-Vilant (TRELEGY ELLIPTA) 200-62.5-25 MCG/INH AEPB No longer taking   . furosemide (LASIX) 40 MG tablet TAKE ONE TABLET BY MOUTH DAILY AS NEEDED FOR SWELLING  . lisinopril (ZESTRIL) 5 MG tablet TAKE ONE TABLET BY MOUTH DAILY.  . metFORMIN (GLUCOPHAGE) 500 MG tablet Take by mouth 2  (two) times daily with a meal.  . Multiple Vitamins-Minerals (MULTIVITAMIN) tablet Take 1 tablet by mouth daily. Nature made brand  . pantoprazole (PROTONIX) 40 MG tablet TAKE ONE TABLET BY MOUTH 30 MINUTES PRIOR TO FIRST MEAL  . potassium chloride SA (KLOR-CON) 20 MEQ tablet Take 1 tablet (20 mEq total) by mouth daily.                  Past Medical History:  Diagnosis Date  . Acid reflux   . Hypertension   . Sleep apnea       Objective:      Wt Readings from Last 3 Encounters:  09/11/20 246 lb (111.6 kg)  07/17/20 245 lb 6.4 oz (111.3 kg)  12/11/19 269 lb (122 kg)    amb bf nad occ vigorous throat clearing   Vital signs reviewed - Note on arrival 09/11/2020  02 sats  94% on RA       HEENT : pt wearing mask not removed for exam due to covid -19 concerns.    NECK :  without JVD/Nodes/TM/ nl carotid upstrokes bilaterally   LUNGS: no acc muscle use,  Nl contour chest which is clear to A and P bilaterally without cough on insp or exp maneuvers   CV:  RRR  no s3 or murmur or increase in P2, and no edema   ABD:  Obese soft and nontender with nl inspiratory excursion in the supine position. No bruits or organomegaly appreciated, bowel sounds nl  MS:  Nl gait/ ext warm without deformities, calf tenderness, cyanosis or clubbing No obvious joint restrictions   SKIN: warm and dry without lesions    NEURO:  alert, approp, nl sensorium with  no motor or cerebellar deficits apparent.          Assessment

## 2020-09-12 ENCOUNTER — Encounter: Payer: Self-pay | Admitting: Internal Medicine

## 2020-09-12 NOTE — Assessment & Plan Note (Addendum)
Onset 2000 with pna/ chf/ wt gain - Echo 09/13/17  Mild / mod LVH with nl LA -  07/17/2020   Walked RA  approx   600 ft  @ [place] to fast pace  stopped due to  End of study though sob at end and sats 93%    - PFTs 03/09/17 restrictive with minimal asthma component and ERV 34%  - PFTs 105/21 unchanged  - 09/11/2020  Change the lisinopril to  ibesartan 40 mg one half daily (see separate a/p)   >>>> advised on submax ex for reconditioning/ wt loss

## 2020-09-12 NOTE — Assessment & Plan Note (Addendum)
Try off lisinopril for sob/ throat clearing 09/11/2020   ACE inhibitors are problematic in  pts with airway complaints because  even experienced pulmonologists can't always distinguish ace effects from copd/asthma.  By themselves they don't actually cause a problem, much like oxygen can't by itself start a fire, but they certainly serve as a powerful catalyst or enhancer for any "fire"  or inflammatory process in the upper airway, be it caused by an ET  tube or more commonly reflux (especially in the obese or pts with known GERD or who are on biphoshonates).    In the era of ARB near equivalency until we have a better handle on the reversibility of the airway problem, it just makes sense to avoid ACEI  entirely in the short run and then decide later, having established a level of airway control using a reasonable limited regimen, whether to add back ace but even then being very careful to observe the pt for worsening airway control and number of meds used/ needed to control symptoms.     >>> try micardis 40  Mg one half daily and f/u with PCP           Each maintenance medication was reviewed in detail including emphasizing most importantly the difference between maintenance and prns and under what circumstances the prns are to be triggered using an action plan format where appropriate.  Total time for H and P, chart review, counseling, teaching device and generating customized AVS unique to this final summary office visit / charting = 32 min

## 2020-09-12 NOTE — Assessment & Plan Note (Addendum)
Never smoker - PFT's  03/09/17 @ wt 254   FEV1 1.32 (62 % ) ratio 0.76  p 18 % improvement from saba p albuterol prior to study with DLCO  17.75 (67%) corrects to 6.3 (127%)  for alv volume and FV curve min concavity and ERV 34%  - PFT's  09/09/2020 @ wt 246   FEV1 1.32 (65 % ) ratio 0.76  p 21 % improvement from saba p 16.47 prior to study with DLCO  16.47 (79%) corrects to 5.49 (131%)  for alv volume and FV curve mild concavity and ERV 46%    No worse off trelegy though she clearly has an asthmatic component so advised to restart immediately for any flare as her main problem is restrictive, not obstructive, but may  be prone to exac and needs to keep saba handy in case needs it for rescue.

## 2020-09-26 ENCOUNTER — Other Ambulatory Visit: Payer: Self-pay | Admitting: Cardiology

## 2020-09-26 DIAGNOSIS — I2510 Atherosclerotic heart disease of native coronary artery without angina pectoris: Secondary | ICD-10-CM

## 2020-09-26 DIAGNOSIS — I5021 Acute systolic (congestive) heart failure: Secondary | ICD-10-CM

## 2020-09-26 DIAGNOSIS — I480 Paroxysmal atrial fibrillation: Secondary | ICD-10-CM

## 2020-10-01 DIAGNOSIS — Z6841 Body Mass Index (BMI) 40.0 and over, adult: Secondary | ICD-10-CM | POA: Diagnosis not present

## 2020-10-01 DIAGNOSIS — Z299 Encounter for prophylactic measures, unspecified: Secondary | ICD-10-CM | POA: Diagnosis not present

## 2020-10-01 DIAGNOSIS — Z23 Encounter for immunization: Secondary | ICD-10-CM | POA: Diagnosis not present

## 2020-10-01 DIAGNOSIS — E1165 Type 2 diabetes mellitus with hyperglycemia: Secondary | ICD-10-CM | POA: Diagnosis not present

## 2020-10-01 DIAGNOSIS — I2510 Atherosclerotic heart disease of native coronary artery without angina pectoris: Secondary | ICD-10-CM | POA: Diagnosis not present

## 2020-10-01 DIAGNOSIS — E119 Type 2 diabetes mellitus without complications: Secondary | ICD-10-CM | POA: Diagnosis not present

## 2020-10-01 DIAGNOSIS — I10 Essential (primary) hypertension: Secondary | ICD-10-CM | POA: Diagnosis not present

## 2020-10-10 ENCOUNTER — Other Ambulatory Visit: Payer: Self-pay | Admitting: Cardiology

## 2020-11-24 DIAGNOSIS — Z23 Encounter for immunization: Secondary | ICD-10-CM | POA: Diagnosis not present

## 2020-11-26 ENCOUNTER — Other Ambulatory Visit: Payer: Self-pay | Admitting: Cardiology

## 2020-12-22 ENCOUNTER — Encounter: Payer: Self-pay | Admitting: Cardiology

## 2020-12-22 ENCOUNTER — Encounter: Payer: Self-pay | Admitting: *Deleted

## 2020-12-22 ENCOUNTER — Telehealth (INDEPENDENT_AMBULATORY_CARE_PROVIDER_SITE_OTHER): Payer: Medicare Other | Admitting: Cardiology

## 2020-12-22 VITALS — Ht 65.0 in | Wt 248.0 lb

## 2020-12-22 DIAGNOSIS — I2510 Atherosclerotic heart disease of native coronary artery without angina pectoris: Secondary | ICD-10-CM | POA: Diagnosis not present

## 2020-12-22 DIAGNOSIS — E782 Mixed hyperlipidemia: Secondary | ICD-10-CM | POA: Diagnosis not present

## 2020-12-22 DIAGNOSIS — I4891 Unspecified atrial fibrillation: Secondary | ICD-10-CM

## 2020-12-22 MED ORDER — BISOPROLOL FUMARATE 5 MG PO TABS: 5.0000 mg | ORAL_TABLET | Freq: Every day | ORAL | 1 refills | Status: DC

## 2020-12-22 NOTE — Patient Instructions (Signed)
Your physician wants you to follow-up in: [place] will receive a reminder letter in the mail two months in advance. If you don't receive a letter, please call our office to schedule the follow-up appointment.  Your physician recommends that you continue on your current medications as directed. Please refer to the Current Medication list given to you today.  WE HAVE SENT REFILLS FOR BISOPROLOL 5 MG DAILY   Thank you for choosing [facility]!!

## 2020-12-22 NOTE — Addendum Note (Signed)
Addended by: Julian Hy T on: 12/22/2020 03:37 PM   Modules accepted: Orders

## 2020-12-22 NOTE — Progress Notes (Signed)
Virtual Visit via Telephone Note   This visit type was conducted due to national recommendations for restrictions regarding the COVID-19 Pandemic (e.g. social distancing) in an effort to limit this patient's exposure and mitigate transmission in our community.  Due to her co-morbid illnesses, this patient is at least at moderate risk for complications without adequate follow up.  This format is felt to be most appropriate for this patient at this time.  The patient did not have access to video technology/had technical difficulties with video requiring transitioning to audio format only (telephone).  All issues noted in this document were discussed and addressed.  No physical exam could be performed with this format.  Please refer to the patient's chart for her  consent to telehealth for [facility] [place].    Date:  12/22/2020   ID:  Pamela Soto, DOB Mar 19, 1951, MRN [id number] The patient was identified using 2 identifiers.  Patient Location: Home Provider Location: Office/Clinic  PCP:  Berenice Primas  Cardiologist:  Carlyle Dolly, MD  Electrophysiologist:  None   Evaluation Performed:  Follow-Up Visit  Chief Complaint:  Follow up  History of Present Illness:    Pamela Soto is a 70 y.o. female seen today for follow up of the following medical problems.    1. NICM/Chronic systolic HF/NSTEMI - admit Jan 2018 with CHF - echo at that time showed LVEF 35-40%, a new finding for the patient.  - referred for cath. Feb 2018 cath: distal LAD100%, Prox RCA 40%, OM3 40%. RHC mean PA 37, PCWP 26, CI 1.73. CAD medically managed.  - repeat echo 07/2017: LVEF 50-55%, abnormal diastolic function   - mild chest pain recently. + wheezing.  Pressing feeling on chest with activity. DOE with activities like walking at the store x few months. No recent edema. Symptoms can last a few hours, up to 3-4 hours. Not positional. Not related to food or eating - +orthopnea  - no SOB/DOE. No recent  SOb/DOE - compliatn with meds. Has lasix just prn, has not needed.     2. COPD - 03/2017 PFTs: severe COPD - followed by Dr Melvyn Novas    3. Afib - denies any palpitations - no bleeding on eliquis.    4. Hyperlipidemia - reports nonspecific neck and shoulder pains, unclear if related to statin.  - somewhat improved with lower statin dosing -07/2017 TC 131 TG 108 HDL 48 LDL 61 - atorvastatin recently increased to 40mg  daily. Since that time has had some stomach upset for few week, constiptation alternatingwith diarrhea.   - tolerating atorva 40mg  daily - reports most recent labs done by pcp   5. DM2 - followed by pcp   6. OSA - sleep study 03/2017 with severe OSA.On bipap - compliant with bipap    Has had covid vaccine x 3.   The patient does not have symptoms concerning for COVID-19 infection (fever, chills, cough, or new shortness of breath).    Past Medical History:  Diagnosis Date  . Acid reflux   . Hypertension   . Sleep apnea    Past Surgical History:  Procedure Laterality Date  . BIOPSY  09/13/2017   Procedure: BIOPSY;  Surgeon: Danie Binder, MD;  [facility];  Service: Endoscopy;;  colon duodenum gastric  . BREAST BIOPSY Right    normal result  . CESAREAN SECTION    . CHOLECYSTECTOMY  11/06/2012   Procedure: LAPAROSCOPIC CHOLECYSTECTOMY WITH INTRAOPERATIVE CHOLANGIOGRAM;  Surgeon: Pedro Earls, MD;  Location:  [facility];  Service: General;  Laterality: N/A;  . COLONOSCOPY N/A 09/13/2017   Procedure: COLONOSCOPY;  Surgeon: Danie Binder, MD;  [facility];  Service: Endoscopy;  Laterality: N/A;  245  . ESOPHAGOGASTRODUODENOSCOPY N/A 09/13/2017   Procedure: ESOPHAGOGASTRODUODENOSCOPY (EGD);  Surgeon: Danie Binder, MD;  [facility];  Service: Endoscopy;  Laterality: N/A;  . POLYPECTOMY  09/13/2017   Procedure: POLYPECTOMY;  Surgeon: Danie Binder, MD;  [facility];  Service: Endoscopy;;  colon   . RIGHT/LEFT HEART CATH AND CORONARY ANGIOGRAPHY N/A 01/10/2017   Procedure: Right/Left Heart Cath and Coronary Angiography;  Surgeon: Belva Crome, MD;  Location: [facility];  Service: Cardiovascular;  Laterality: N/A;  . UMBILICAL HERNIA REPAIR  11/06/2012   Procedure: HERNIA REPAIR UMBILICAL ADULT;  Surgeon: Pedro Earls, MD;  Location: [facility];  Service: General;;     No outpatient medications have been marked as taking for the 12/22/20 encounter (Appointment) with Arnoldo Lenis, MD.     Allergies:   Patient has no known allergies.   Social History   Tobacco Use  . Smoking status: Never Smoker  . Smokeless tobacco: Never Used  Vaping Use  . Vaping Use: Never used  Substance Use Topics  . Alcohol use: No  . Drug use: No     Family Hx: The patient's family history includes Asthma in her father; Cancer in her father and sister; Heart attack in her mother; Heart attack (age of onset: 83) in her brother; Heart disease (age of onset: 3) in her mother. There is no history of Colon cancer.  ROS:   Please see the history of present illness.     All other systems reviewed and are negative.   Prior CV studies:   The following studies were reviewed today:  01/2017 cath  Dist LAD lesion, 100 %stenosed.  Prox RCA lesion, 40 %stenosed.  3rd Mrg lesion, 40 %stenosed.  There is severe left ventricular systolic dysfunction.  The left ventricular ejection fraction is less than 25% by visual estimate.  LV end diastolic pressure is severely elevated.  Hemodynamic findings consistent with moderate pulmonary hypertension.   Acute atrial fibrillation developed on the cath table during right heart/right atrial pressure recording. Rate 118 bpm. Atrial fibrillation was treated with one bolus of IV amiodarone.  40% proximal RCA stenosis.  Tortuous but normal circumflex  100% distal/apical LAD stenosis. This lesion is probably chronic but difficult to determine age.  There is also a possibility that it is acute embolic occlusion from a catheter-based thrombus. For that reason additional heparin and then Aggrastat bolus was given as the patient complained of chest pressure post procedure.  Chest pressure post procedure is similar to prior episodes.  Severe LV dysfunction with EF less than 25%. Contraction pattern appears global.  Arterial blood gas demonstrated severe CO2 retention, pCO2 67.    01/2017 echo Study Conclusions  - Left ventricle: The cavity size was mildly dilated. Wall thickness was increased in a pattern of moderate LVH. Systolic function was moderately reduced. The estimated ejection fraction was in the range of 35% to 40%. Diffuse hypokinesis. - Aortic valve: Mildly calcified annulus. Trileaflet; mildly thickened leaflets. - Mitral valve: There was mild regurgitation. - Left atrium: The atrium was moderately dilated. - Atrial septum: The septum bowed from left to right, consistent with increased left atrial pressure. - Pulmonic valve: There was mild regurgitation.   07/2017 echo Study Conclusions  - Left  ventricle: The cavity size was normal. Wall thickness was increased increased in a pattern of mild to moderate LVH. Systolic function was normal. The estimated ejection fraction was in the range of 50% to 55%. Diastolic function is abnormal, indeterminant grade. LVOT VTI and Vmax likely underestimated based on angle of acquisition. - Aortic valve: Valve area (VTI): 1.56 cm^2. Valve area (Vmax): 1.84 cm^2. - Technically difficult study.  Labs/Other Tests and Data Reviewed:    EKG:  No ECG reviewed.  Recent Labs: No results found for requested labs within last 8760 hours.   Recent Lipid Panel Lab Results  Component Value Date/Time   CHOL 131 07/13/2017 12:00 PM   TRIG 108 07/13/2017 12:00 PM   HDL 48 07/13/2017 12:00 PM   CHOLHDL 2.7 07/13/2017 12:00 PM   LDLCALC 61 07/13/2017 12:00 PM     Wt Readings from Last 3 Encounters:  09/11/20 246 lb (111.6 kg)  07/17/20 245 lb 6.4 oz (111.3 kg)  12/11/19 269 lb (122 kg)     Risk Assessment/Calculations:      Objective:    Vital Signs:   Today's Vitals   12/22/20 1424  Weight: 248 lb (112.5 kg)  Height: 5\' 5"  (1.651 m)   Body mass index is 41.27 kg/m.  Normal affect. Normal speech pattern and tone. Comfortable, no apparent distress. No audible signs of sob or wheezing.   ASSESSMENT & PLAN:      1. CAD/Chronic systolic HF -no symptoms - unclear what happpened to her bisoprolol, will check again with patient and her pharmacy, I dont see where it was stopped   2. Afib - no symptoms, continue eliquis. Check on bisoprolol which no longer is on her list  3. Hyperlipidemia -continue statin, request pcp labs       COVID-19 Education: The signs and symptoms of COVID-19 were discussed with the patient and how to seek care for testing (follow up with PCP or arrange E-visit).  The importance of social distancing was discussed today.  Time:   Today, I have spent 16 minutes with the patient with telehealth technology discussing the above problems.     Medication Adjustments/Labs and Tests Ordered: Current medicines are reviewed at length with the patient today.  Concerns regarding medicines are outlined above.   Tests Ordered: No orders of the defined types were placed in this encounter.   Medication Changes: No orders of the defined types were placed in this encounter.   Follow Up:  In Person in 6 month(s)  Signed, Carlyle Dolly, MD  12/22/2020 11:30 AM    [place]

## 2020-12-24 ENCOUNTER — Other Ambulatory Visit: Payer: Self-pay | Admitting: Cardiology

## 2020-12-24 DIAGNOSIS — I480 Paroxysmal atrial fibrillation: Secondary | ICD-10-CM

## 2021-01-08 DIAGNOSIS — I4891 Unspecified atrial fibrillation: Secondary | ICD-10-CM | POA: Diagnosis not present

## 2021-01-08 DIAGNOSIS — I10 Essential (primary) hypertension: Secondary | ICD-10-CM | POA: Diagnosis not present

## 2021-01-08 DIAGNOSIS — E1165 Type 2 diabetes mellitus with hyperglycemia: Secondary | ICD-10-CM | POA: Diagnosis not present

## 2021-01-08 DIAGNOSIS — I5022 Chronic systolic (congestive) heart failure: Secondary | ICD-10-CM | POA: Diagnosis not present

## 2021-01-08 DIAGNOSIS — I2510 Atherosclerotic heart disease of native coronary artery without angina pectoris: Secondary | ICD-10-CM | POA: Diagnosis not present

## 2021-01-08 DIAGNOSIS — Z299 Encounter for prophylactic measures, unspecified: Secondary | ICD-10-CM | POA: Diagnosis not present

## 2021-01-19 ENCOUNTER — Other Ambulatory Visit: Payer: Self-pay | Admitting: Cardiology

## 2021-04-06 ENCOUNTER — Other Ambulatory Visit: Payer: Self-pay | Admitting: Cardiology

## 2021-04-14 DIAGNOSIS — I10 Essential (primary) hypertension: Secondary | ICD-10-CM | POA: Diagnosis not present

## 2021-04-14 DIAGNOSIS — E1165 Type 2 diabetes mellitus with hyperglycemia: Secondary | ICD-10-CM | POA: Diagnosis not present

## 2021-04-14 DIAGNOSIS — I4891 Unspecified atrial fibrillation: Secondary | ICD-10-CM | POA: Diagnosis not present

## 2021-04-14 DIAGNOSIS — Z789 Other specified health status: Secondary | ICD-10-CM | POA: Diagnosis not present

## 2021-04-14 DIAGNOSIS — Z6841 Body Mass Index (BMI) 40.0 and over, adult: Secondary | ICD-10-CM | POA: Diagnosis not present

## 2021-04-14 DIAGNOSIS — I5022 Chronic systolic (congestive) heart failure: Secondary | ICD-10-CM | POA: Diagnosis not present

## 2021-04-14 DIAGNOSIS — Z299 Encounter for prophylactic measures, unspecified: Secondary | ICD-10-CM | POA: Diagnosis not present

## 2021-05-20 ENCOUNTER — Encounter: Payer: Self-pay | Admitting: *Deleted

## 2021-05-20 ENCOUNTER — Telehealth: Payer: Self-pay | Admitting: Cardiology

## 2021-05-20 DIAGNOSIS — E1165 Type 2 diabetes mellitus with hyperglycemia: Secondary | ICD-10-CM | POA: Diagnosis not present

## 2021-05-20 DIAGNOSIS — Z7189 Other specified counseling: Secondary | ICD-10-CM | POA: Diagnosis not present

## 2021-05-20 DIAGNOSIS — Z0000 Encounter for general adult medical examination without abnormal findings: Secondary | ICD-10-CM | POA: Diagnosis not present

## 2021-05-20 DIAGNOSIS — Z789 Other specified health status: Secondary | ICD-10-CM | POA: Diagnosis not present

## 2021-05-20 DIAGNOSIS — Z79899 Other long term (current) drug therapy: Secondary | ICD-10-CM | POA: Diagnosis not present

## 2021-05-20 DIAGNOSIS — E7800 Pure hypercholesterolemia, unspecified: Secondary | ICD-10-CM | POA: Diagnosis not present

## 2021-05-20 DIAGNOSIS — Z299 Encounter for prophylactic measures, unspecified: Secondary | ICD-10-CM | POA: Diagnosis not present

## 2021-05-20 DIAGNOSIS — I5022 Chronic systolic (congestive) heart failure: Secondary | ICD-10-CM | POA: Diagnosis not present

## 2021-05-20 DIAGNOSIS — Z1331 Encounter for screening for depression: Secondary | ICD-10-CM | POA: Diagnosis not present

## 2021-05-20 DIAGNOSIS — Z1339 Encounter for screening examination for other mental health and behavioral disorders: Secondary | ICD-10-CM | POA: Diagnosis not present

## 2021-05-20 DIAGNOSIS — Z6841 Body Mass Index (BMI) 40.0 and over, adult: Secondary | ICD-10-CM | POA: Diagnosis not present

## 2021-05-20 DIAGNOSIS — R5383 Other fatigue: Secondary | ICD-10-CM | POA: Diagnosis not present

## 2021-05-20 DIAGNOSIS — I10 Essential (primary) hypertension: Secondary | ICD-10-CM | POA: Diagnosis not present

## 2021-05-20 DIAGNOSIS — I428 Other cardiomyopathies: Secondary | ICD-10-CM | POA: Insufficient documentation

## 2021-05-20 NOTE — Telephone Encounter (Signed)
Reports SOB and weight gain for the past month. Says SOB has gotten worse over the past 2 days. Reports chest pain for the past few days when taking a deep breath. Denies swelling in legs or other parts of body. Does not weigh self at home nor check vitals. Denies cough, congestion, fever, sore throat or n/v. Says she takes lasix as needed. Appointment given to patient to see Dr. Percival Spanish in the morning at 8:40 am with his permission. Advised if she develops worsening symptoms, to proceed to the ED for an evaluation. Verbalized understanding of plan.

## 2021-05-20 NOTE — Progress Notes (Signed)
Cardiology Office Note   Date:  05/21/2021   ID:  SHAMIR SEDLAR, DOB [birth year], MRN [id number]  PCP:  Berenice Primas  Cardiologist:   Carlyle Dolly, MD   Chief Complaint  Patient presents with   Shortness of Breath       History of Present Illness: Pamela Soto is a 70 y.o. female who presents for evaluation of SOB.  She sees Dr. Harl Bowie. She has a NICM with an EF of 35- 40% at one point but was 50 - 55% in 2018.  Feb 2018 cath: distal LAD 100%, Prox RCA 40%, OM3 40%. RHC mean PA 37, PCWP 26, CI 1.73. CAD medically managed.  She does have severe COPD in a previous diagnosis.  Her most recent PFTs suggested moderately severe obstructive disease.  She is seeing Dr. Melvyn Novas who told her she likely does not have COPD but might be having problems with ACE inhibitors.  However she did not improve with switching to myocarditis.  She says she has been chronically short of breath for 2 years but has been worse for 2 weeks.  She is not describing new PND or orthopnea.  She is not describing any swelling.  She is not having any new chest pressure, neck or arm discomfort.  Her weights have been stable.  She has not been measuring her oxygen level.  She just says she is short of breath doing mild activities.   Past Medical History:  Diagnosis Date   Acid reflux    Hypertension    Sleep apnea     Past Surgical History:  Procedure Laterality Date   BIOPSY  09/13/2017   Procedure: BIOPSY;  Surgeon: Danie Binder, MD;  [facility];  Service: Endoscopy;;  colon duodenum gastric   BREAST BIOPSY Right    normal result   CESAREAN SECTION     CHOLECYSTECTOMY  11/06/2012   Procedure: LAPAROSCOPIC CHOLECYSTECTOMY WITH INTRAOPERATIVE CHOLANGIOGRAM;  Surgeon: Pedro Earls, MD;  [facility]: [facility];  Service: General;  Laterality: N/A;   COLONOSCOPY N/A 09/13/2017   Procedure: COLONOSCOPY;  Surgeon: Danie Binder, MD;  [facility];  Service: Endoscopy;  Laterality:  N/A;  245   ESOPHAGOGASTRODUODENOSCOPY N/A 09/13/2017   Procedure: ESOPHAGOGASTRODUODENOSCOPY (EGD);  Surgeon: Danie Binder, MD;  [facility];  Service: Endoscopy;  Laterality: N/A;   POLYPECTOMY  09/13/2017   Procedure: POLYPECTOMY;  Surgeon: Danie Binder, MD;  [facility];  Service: Endoscopy;;  colon   RIGHT/LEFT HEART CATH AND CORONARY ANGIOGRAPHY N/A 01/10/2017   Procedure: Right/Left Heart Cath and Coronary Angiography;  Surgeon: Belva Crome, MD;  Location: [place] [facility];  Service: Cardiovascular;  Laterality: N/A;   UMBILICAL HERNIA REPAIR  11/06/2012   Procedure: HERNIA REPAIR UMBILICAL ADULT;  Surgeon: Pedro Earls, MD;  Location: [facility];  Service: General;;     Current Outpatient Medications  Medication Sig Dispense Refill   atorvastatin (LIPITOR) 40 MG tablet TAKE ONE TABLET BY MOUTH DAILY 90 tablet 3   bisoprolol (ZEBETA) 5 MG tablet TAKE ONE TABLET BY MOUTH DAILY. 90 tablet 1   cyclobenzaprine (FLEXERIL) 10 MG tablet Take 10 mg by mouth at bedtime.     ELIQUIS 5 MG TABS tablet TAKE ONE TABLET BY MOUTH TWICE DAILY. 60 tablet 6   furosemide (LASIX) 40 MG tablet TAKE ONE TABLET BY MOUTH DAILY AS NEEDED FOR SWELLING 30 tablet 1   metFORMIN (GLUCOPHAGE) 500 MG  tablet Take 500 mg by mouth daily with breakfast.     Multiple Vitamins-Minerals (MULTIVITAMIN) tablet Take 1 tablet by mouth daily. Nature made brand 30 tablet    pantoprazole (PROTONIX) 40 MG tablet TAKE ONE TABLET BY MOUTH 30 MINUTES PRIOR TO FIRST MEAL 90 tablet 3   potassium chloride SA (KLOR-CON) 20 MEQ tablet TAKE ONE TABLET BY MOUTH DAILY 90 tablet 1   telmisartan (MICARDIS) 40 MG tablet Take 1 tablet (40 mg total) by mouth daily. 30 tablet 11   No current facility-administered medications for this visit.    Allergies:   Patient has no known allergies.    ROS:  Please see the history of present illness.   Otherwise, review of systems are positive for none.   All other systems  are reviewed and negative.    PHYSICAL EXAM: VS:  BP 132/76   Pulse (!) 59   Ht 5\' 5"  (1.651 m)   Wt 250 lb (113.4 kg)   SpO2 (!) 89% Comment: after walking 3 minutes @0900   BMI 41.60 kg/m  , BMI Body mass index is 41.6 kg/m. GENERAL:  Well appearing NECK:  No jugular venous distention, waveform within normal limits, carotid upstroke brisk and symmetric, no bruits, no thyromegaly LUNGS:  Clear to auscultation bilaterally CHEST:  Unremarkable HEART:  PMI not displaced or sustained,S1 and S2 within normal limits, no S3, no S4, no clicks, no rubs, no murmurs ABD:  Flat, positive bowel sounds normal in frequency in pitch, no bruits, no rebound, no guarding, no midline pulsatile mass, no hepatomegaly, no splenomegaly EXT:  2 plus pulses throughout, no edema, no cyanosis no clubbing    EKG:  EKG is ordered today. The ekg ordered today demonstrates sinus rhythm, rate 59, axis within normal limits, intervals within normal limits, no acute ST-T wave changes.   Recent Labs: No results found for requested labs within last 8760 hours.    Lipid Panel    Component Value Date/Time   CHOL 131 07/13/2017 1200   TRIG 108 07/13/2017 1200   HDL 48 07/13/2017 1200   CHOLHDL 2.7 07/13/2017 1200   VLDL 22 07/13/2017 1200   LDLCALC 61 07/13/2017 1200      Wt Readings from Last 3 Encounters:  05/21/21 250 lb (113.4 kg)  12/22/20 248 lb (112.5 kg)  09/11/20 246 lb (111.6 kg)      Other studies Reviewed: Additional studies/ records that were reviewed today include: PFTs and pulmonary notes. Review of the above records demonstrates:  Please see elsewhere in the note.   ASSESSMENT AND PLAN:   NICM/Chronic systolic HF/NSTEMI:    The patient was added to my clinic today because of shortness of breath.  She was subjectively short of breath walking around the office but her oxygen saturations dropped to 89% with some wheezing.  She has not been taking the Lasix as needed so she is going to do  this for 3 days.  I will check a BNP level, basic metabolic profile and repeat an echocardiogram.  I will check a chest x-ray as well.  We will have her come back after this to see how she is doing and to make further adjustments.  She does not restrict salt and we talked about this.   COPD:   I did ask her to follow-up with Dr. Melvyn Novas to clarify the results of her most recent pulmonary function test.    Afib: He has been tolerating anticoagulation.  Has had no symptomatic paroxysms.  Hyperlipidemia: LDL is 84 with an HDL of 51.  No change in therapy.   DM2: A1c was 7.3.  I will defer to Nicanor Bake C    OSA : She uses BiPAP.  Current medicines are reviewed at length with the patient today.  The patient does not have concerns regarding medicines.  The following changes have been made:  As above  Labs/ tests ordered today include:   Orders Placed This Encounter  Procedures   DG Chest 2 View   Basic metabolic panel   Basic metabolic panel   EKG 84-TXMI   ECHOCARDIOGRAM COMPLETE     Disposition:   FU with Dr. Harl Bowie after the above studies.      Signed, Minus Breeding, MD  05/21/2021 9:18 AM    [place]

## 2021-05-20 NOTE — Telephone Encounter (Signed)
Pt's PCP office called requesting an apt for the patient to be seen due to gaining 10lbs in 1 month, having difficulty breathing, CHF

## 2021-05-21 ENCOUNTER — Other Ambulatory Visit: Payer: Self-pay

## 2021-05-21 ENCOUNTER — Ambulatory Visit (INDEPENDENT_AMBULATORY_CARE_PROVIDER_SITE_OTHER): Payer: Medicare Other | Admitting: Cardiology

## 2021-05-21 ENCOUNTER — Encounter: Payer: Self-pay | Admitting: Cardiology

## 2021-05-21 ENCOUNTER — Ambulatory Visit (INDEPENDENT_AMBULATORY_CARE_PROVIDER_SITE_OTHER): Payer: Medicare Other

## 2021-05-21 ENCOUNTER — Ambulatory Visit (HOSPITAL_COMMUNITY)
Admission: RE | Admit: 2021-05-21 | Discharge: 2021-05-21 | Disposition: A | Discharge status: Home / Self Care | Payer: Medicare Other | Source: Ambulatory Visit | Attending: Cardiology | Admitting: Cardiology

## 2021-05-21 ENCOUNTER — Other Ambulatory Visit (HOSPITAL_COMMUNITY)
Admission: RE | Admit: 2021-05-21 | Discharge: 2021-05-21 | Disposition: A | Discharge status: Home / Self Care | Payer: Medicare Other | Source: Ambulatory Visit | Attending: Cardiology | Admitting: Cardiology

## 2021-05-21 VITALS — BP 132/76 | HR 84 | Ht 65.0 in | Wt 250.0 lb

## 2021-05-21 DIAGNOSIS — I517 Cardiomegaly: Secondary | ICD-10-CM | POA: Diagnosis not present

## 2021-05-21 DIAGNOSIS — I4891 Unspecified atrial fibrillation: Secondary | ICD-10-CM | POA: Diagnosis not present

## 2021-05-21 DIAGNOSIS — I428 Other cardiomyopathies: Secondary | ICD-10-CM

## 2021-05-21 DIAGNOSIS — I2510 Atherosclerotic heart disease of native coronary artery without angina pectoris: Secondary | ICD-10-CM

## 2021-05-21 DIAGNOSIS — R0602 Shortness of breath: Secondary | ICD-10-CM

## 2021-05-21 DIAGNOSIS — I10 Essential (primary) hypertension: Secondary | ICD-10-CM | POA: Diagnosis not present

## 2021-05-21 DIAGNOSIS — Z79899 Other long term (current) drug therapy: Secondary | ICD-10-CM | POA: Diagnosis not present

## 2021-05-21 LAB — ECHOCARDIOGRAM COMPLETE
AR max vel: 1.16 cm2
AV Area VTI: 1.14 cm2
AV Area mean vel: 1.22 cm2
AV Mean grad: 6 mmHg
AV Peak grad: 12 mmHg
Ao pk vel: 1.73 m/s
Area-P 1/2: 2.66 cm2
Calc EF: 66.1 %
Height: 65 in
S' Lateral: 2.54 cm
Single Plane A2C EF: 61.7 %
Single Plane A4C EF: 69.1 %
Weight: 4000 oz

## 2021-05-21 LAB — BASIC METABOLIC PANEL
Anion gap: 6 (ref 5–15)
BUN: 15 mg/dL (ref 8–23)
CO2: 32 mmol/L (ref 22–32)
Calcium: 8.7 mg/dL — ABNORMAL LOW (ref 8.9–10.3)
Chloride: 100 mmol/L (ref 98–111)
Creatinine, Ser: 0.93 mg/dL (ref 0.44–1.00)
GFR, Estimated: >60 mL/min (ref >60)
Glucose, Bld: 152 mg/dL — ABNORMAL HIGH (ref 70–99)
Potassium: 4.3 mmol/L (ref 3.5–5.1)
Sodium: 138 mmol/L (ref 135–145)

## 2021-05-21 NOTE — Patient Instructions (Addendum)
Medication Instructions:  Your physician has recommended you make the following change in your medication:  Take furosemide 40 mg daily for 3 days Continue other medications the same  Labwork: BMET today at [employer] in 7-10 days at [facility]  Testing/Procedures: A chest x-ray takes a picture of the organs and structures inside the chest, including the heart, lungs, and blood vessels. This test can show several things, including, whether the heart is enlarges; whether fluid is building up in the lungs; and whether pacemaker / defibrillator leads are still in place. Your physician has requested that you have an echocardiogram. Echocardiography is a painless test that uses sound waves to create images of your heart. It provides your doctor with information about the size and shape of your heart and how well your heart's chambers and valves are working. This procedure takes approximately one hour. There are no restrictions for this procedure.  Follow-Up: Your physician recommends that you schedule a follow-up appointment in: as planned with Dr. Harl Bowie  Any Other Special Instructions Will Be Listed Below (If Applicable).  If you need a refill on your cardiac medications before your next appointment, please call your pharmacy.

## 2021-05-28 ENCOUNTER — Encounter: Payer: Self-pay | Admitting: *Deleted

## 2021-06-22 DIAGNOSIS — Z20822 Contact with and (suspected) exposure to covid-19: Secondary | ICD-10-CM | POA: Diagnosis not present

## 2021-06-24 ENCOUNTER — Ambulatory Visit (INDEPENDENT_AMBULATORY_CARE_PROVIDER_SITE_OTHER): Payer: Medicare Other | Admitting: Cardiology

## 2021-06-24 ENCOUNTER — Encounter: Payer: Self-pay | Admitting: Cardiology

## 2021-06-24 VITALS — BP 128/56 | HR 62 | Ht 65.0 in | Wt 257.2 lb

## 2021-06-24 DIAGNOSIS — R609 Edema, unspecified: Secondary | ICD-10-CM

## 2021-06-24 DIAGNOSIS — I5023 Acute on chronic systolic (congestive) heart failure: Secondary | ICD-10-CM

## 2021-06-24 DIAGNOSIS — I10 Essential (primary) hypertension: Secondary | ICD-10-CM | POA: Diagnosis not present

## 2021-06-24 DIAGNOSIS — I2510 Atherosclerotic heart disease of native coronary artery without angina pectoris: Secondary | ICD-10-CM

## 2021-06-24 MED ORDER — FUROSEMIDE 20 MG PO TABS: 20.0000 mg | ORAL_TABLET | Freq: Every day | ORAL | 6 refills | Status: DC

## 2021-06-24 NOTE — Progress Notes (Signed)
Clinical Summary Ms. Payeur is a 70 y.o.femaleseen today for follow up of the following medical problems.      1. NICM/Chronic systolic HF/NSTEMI - admit Jan 2018 with CHF - echo at that time showed LVEF 35-40%, a new finding for the patient. - referred for cath. Feb 2018 cath: distal LAD 100%, Prox RCA 40%, OM3 40%. RHC mean PA 37, PCWP 26, CI 1.73. CAD medically managed.  - repeat echo 07/2017: LVEF 50-55%, abnormal diastolic function       - some recent SOB/DOE - she took lasix x 3 days, has not since. - ongoign SOB. No LE edema. +orthopnea x 1 month,  - some mild wheezing, no cough     2.SOB - seen by Dr Percival Spanish 05/2021 in clinic for SOB - CXR no acute process - had not been taking her lasix at the time, was to take more regularly.   From last pulm note:  "No worse off trelegy though she clearly has an asthmatic component so advised to restart immediately for any flare as her main problem is restrictive, not obstructive, but may  be prone to exac and needs to keep saba handy in case needs it for rescue".   3. COPD - 03/2017 PFTs: severe COPD - followed by Dr Melvyn Novas       4. Afib - no recent palpitations      5. Hyperlipidemia - reports nonspecific neck and shoulder pains, unclear if related to statin.  - somewhat improved with lower statin dosing - 07/2017 TC 131 TG 108 HDL 48 LDL 61 - atorvastatin recently increased to 40mg  daily. Since that time has had some stomach upset for few week, constiptation alternating with diarrhea.   - tolerating atorva 40mg  daily - reports most recent labs done by pcp     6. DM2 - followed by pcp     7. OSA - sleep study 03/2017 with severe OSA. On bipap      Past Medical History:  Diagnosis Date   Acid reflux    Hypertension    Sleep apnea      No Known Allergies   Current Outpatient Medications  Medication Sig Dispense Refill   atorvastatin (LIPITOR) 40 MG tablet TAKE ONE TABLET BY MOUTH DAILY 90 tablet 3    bisoprolol (ZEBETA) 5 MG tablet TAKE ONE TABLET BY MOUTH DAILY. 90 tablet 1   cyclobenzaprine (FLEXERIL) 10 MG tablet Take 10 mg by mouth at bedtime.     ELIQUIS 5 MG TABS tablet TAKE ONE TABLET BY MOUTH TWICE DAILY. 60 tablet 6   furosemide (LASIX) 40 MG tablet TAKE ONE TABLET BY MOUTH DAILY AS NEEDED FOR SWELLING 30 tablet 1   metFORMIN (GLUCOPHAGE) 500 MG tablet Take 500 mg by mouth daily with breakfast.     Multiple Vitamins-Minerals (MULTIVITAMIN) tablet Take 1 tablet by mouth daily. Nature made brand 30 tablet    pantoprazole (PROTONIX) 40 MG tablet TAKE ONE TABLET BY MOUTH 30 MINUTES PRIOR TO FIRST MEAL 90 tablet 3   potassium chloride SA (KLOR-CON) 20 MEQ tablet TAKE ONE TABLET BY MOUTH DAILY 90 tablet 1   telmisartan (MICARDIS) 40 MG tablet Take 1 tablet (40 mg total) by mouth daily. 30 tablet 11   No current facility-administered medications for this visit.     Past Surgical History:  Procedure Laterality Date   BIOPSY  09/13/2017   Procedure: BIOPSY;  Surgeon: Danie Binder, MD;  [facility];  Service: Endoscopy;;  colon duodenum gastric   BREAST BIOPSY Right    normal result   CESAREAN SECTION     CHOLECYSTECTOMY  11/06/2012   Procedure: LAPAROSCOPIC CHOLECYSTECTOMY WITH INTRAOPERATIVE CHOLANGIOGRAM;  Surgeon: Pedro Earls, MD;  Location: [facility];  Service: General;  Laterality: N/A;   COLONOSCOPY N/A 09/13/2017   Procedure: COLONOSCOPY;  Surgeon: Danie Binder, MD;  [facility];  Service: Endoscopy;  Laterality: N/A;  245   ESOPHAGOGASTRODUODENOSCOPY N/A 09/13/2017   Procedure: ESOPHAGOGASTRODUODENOSCOPY (EGD);  Surgeon: Danie Binder, MD;  [facility];  Service: Endoscopy;  Laterality: N/A;   POLYPECTOMY  09/13/2017   Procedure: POLYPECTOMY;  Surgeon: Danie Binder, MD;  [facility];  Service: Endoscopy;;  colon   RIGHT/LEFT HEART CATH AND CORONARY ANGIOGRAPHY N/A 01/10/2017   Procedure: Right/Left Heart Cath and  Coronary Angiography;  Surgeon: Belva Crome, MD;  Location: [facility];  Service: Cardiovascular;  Laterality: N/A;   UMBILICAL HERNIA REPAIR  11/06/2012   Procedure: HERNIA REPAIR UMBILICAL ADULT;  Surgeon: Pedro Earls, MD;  Location: [facility];  Service: General;;     No Known Allergies    Family History  Problem Relation Age of Onset   Heart disease Mother 69   Heart attack Mother    Asthma Father    Cancer Father        pancreastic cancer   Heart attack Brother 21   Cancer Sister        unsure   Colon cancer Neg Hx      Social History Ms. Kindel reports that she has never smoked. She has never used smokeless tobacco. Ms. Bottari reports no history of alcohol use.   Review of Systems CONSTITUTIONAL: No weight loss, fever, chills, weakness or fatigue.  HEENT: Eyes: No visual loss, blurred vision, double vision or yellow sclerae.No hearing loss, sneezing, congestion, runny nose or sore throat.  SKIN: No rash or itching.  CARDIOVASCULAR: per hpi RESPIRATORY: per hpi GASTROINTESTINAL: No anorexia, nausea, vomiting or diarrhea. No abdominal pain or blood.  GENITOURINARY: No burning on urination, no polyuria NEUROLOGICAL: No headache, dizziness, syncope, paralysis, ataxia, numbness or tingling in the extremities. No change in bowel or bladder control.  MUSCULOSKELETAL: No muscle, back pain, joint pain or stiffness.  LYMPHATICS: No enlarged nodes. No history of splenectomy.  PSYCHIATRIC: No history of depression or anxiety.  ENDOCRINOLOGIC: No reports of sweating, cold or heat intolerance. No polyuria or polydipsia.  [name]   Physical Examination Today's Vitals   06/24/21 1100  BP: (!) 128/56  Pulse: 62  SpO2: 94%  Weight: 257 lb 3.2 oz (116.7 kg)  Height: 5\' 5"  (1.651 m)   Body mass index is 42.8 kg/m.   Gen: resting comfortably, no acute distress HEENT: no scleral icterus, pupils equal round and reactive, no palptable cervical adenopathy,  CV:RRR, no  mr/g, no jvd Resp: Clear to auscultation bilaterally GI: abdomen is soft, non-tender, non-distended, normal bowel sounds, no hepatosplenomegaly MSK: extremities are warm,  Skin: warm, no rash Neuro:  no focal deficits Psych: appropriate affect   Diagnostic Studies  01/2017 cath Dist LAD lesion, 100 %stenosed. Prox RCA lesion, 40 %stenosed. 3rd Mrg lesion, 40 %stenosed. There is severe left ventricular systolic dysfunction. The left ventricular ejection fraction is less than 25% by visual estimate. LV end diastolic pressure is severely elevated. Hemodynamic findings consistent with moderate pulmonary hypertension.   Acute atrial fibrillation developed on the cath table during right heart/right atrial pressure recording.  Rate 118 bpm. Atrial fibrillation was treated with one bolus of IV amiodarone. 40% proximal RCA stenosis. Tortuous but normal circumflex 100% distal/apical LAD stenosis. This lesion is probably chronic but difficult to determine age. There is also a possibility that it is acute embolic occlusion from a catheter-based thrombus. For that reason additional heparin and then Aggrastat bolus was given as the patient complained of chest pressure post procedure. Chest pressure post procedure is similar to prior episodes. Severe LV dysfunction with EF less than 25%. Contraction pattern appears global. Arterial blood gas demonstrated severe CO2 retention, pCO2 67.       01/2017 echo Study Conclusions   - Left ventricle: The cavity size was mildly dilated. Wall   thickness was increased in a pattern of moderate LVH. Systolic   function was moderately reduced. The estimated ejection fraction   was in the range of 35% to 40%. Diffuse hypokinesis. - Aortic valve: Mildly calcified annulus. Trileaflet; mildly   thickened leaflets. - Mitral valve: There was mild regurgitation. - Left atrium: The atrium was moderately dilated. - Atrial septum: The septum bowed from left to right,  consistent   with increased left atrial pressure. - Pulmonic valve: There was mild regurgitation.     07/2017 echo Study Conclusions   - Left ventricle: The cavity size was normal. Wall thickness was   increased increased in a pattern of mild to moderate LVH.   Systolic function was normal. The estimated ejection fraction was   in the range of 50% to 55%. Diastolic function is abnormal,   indeterminant grade. LVOT VTI and Vmax likely underestimated   based on angle of acquisition. - Aortic valve: Valve area (VTI): 1.56 cm^2. Valve area (Vmax):   1.84 cm^2. - Technically difficult study.   Assessment and Plan  1. CAD/acute on Chronic systolic HF - recent SOB, orthopnea - by clinic weights up 11 lbs since 09/2020. Exam for fluid status difficult due to body habitus, does have some mild LE edema - diuretic dosing limited by leg cramps. Will try lasix 20mg  daily, in 2 weeks check bmet/mg/bnp - if ongoing SOB after diuresis would need to f/u with pulmonary to reassess her lung disease.              Arnoldo Lenis, M.D.

## 2021-06-24 NOTE — Patient Instructions (Addendum)
Medication Instructions:  Change your Lasix to 20mg  daily.  Continue all other medications.    Labwork: BMET, BNP, Mg - orders given today.  Please do in 2 weeks (around 07/08/2021). Office will contact with results via phone or letter.    Testing/Procedures: none  Follow-Up: 3 weeks   Any Other Special Instructions Will Be Listed Below (If Applicable).  If you need a refill on your cardiac medications before your next appointment, please call your pharmacy.

## 2021-07-02 ENCOUNTER — Other Ambulatory Visit: Payer: Self-pay | Admitting: Cardiology

## 2021-07-08 ENCOUNTER — Other Ambulatory Visit (HOSPITAL_COMMUNITY)
Admission: RE | Admit: 2021-07-08 | Discharge: 2021-07-08 | Disposition: A | Discharge status: Home / Self Care | Payer: Medicare Other | Source: Ambulatory Visit | Attending: Cardiology | Admitting: Cardiology

## 2021-07-08 ENCOUNTER — Other Ambulatory Visit: Payer: Self-pay

## 2021-07-08 DIAGNOSIS — I10 Essential (primary) hypertension: Secondary | ICD-10-CM | POA: Insufficient documentation

## 2021-07-08 DIAGNOSIS — I5023 Acute on chronic systolic (congestive) heart failure: Secondary | ICD-10-CM | POA: Diagnosis not present

## 2021-07-08 DIAGNOSIS — R609 Edema, unspecified: Secondary | ICD-10-CM | POA: Insufficient documentation

## 2021-07-08 LAB — BRAIN NATRIURETIC PEPTIDE: B Natriuretic Peptide: 12 pg/mL (ref 0.0–100.0)

## 2021-07-08 LAB — MAGNESIUM: Magnesium: 2 mg/dL (ref 1.7–2.4)

## 2021-07-08 LAB — BASIC METABOLIC PANEL
Anion gap: 4 — ABNORMAL LOW (ref 5–15)
BUN: 14 mg/dL (ref 8–23)
CO2: 31 mmol/L (ref 22–32)
Calcium: 8.4 mg/dL — ABNORMAL LOW (ref 8.9–10.3)
Chloride: 105 mmol/L (ref 98–111)
Creatinine, Ser: 1.1 mg/dL — ABNORMAL HIGH (ref 0.44–1.00)
GFR, Estimated: 54 mL/min — ABNORMAL LOW (ref >60)
Glucose, Bld: 148 mg/dL — ABNORMAL HIGH (ref 70–99)
Potassium: 4.6 mmol/L (ref 3.5–5.1)
Sodium: 140 mmol/L (ref 135–145)

## 2021-07-16 NOTE — Progress Notes (Signed)
Cardiology Office Note  Date: 07/17/2021   ID: DAYA MALLARI, DOB 1951/05/18, MRN [id number]  PCP:  Berenice Primas  Cardiologist:  Carlyle Dolly, MD Electrophysiologist:  None   Chief Complaint: 3-week follow-up  History of Present Illness: JAZZMYN BYES is a 70 y.o. female with a history of systolic heart failure, nonischemic cardiomyopathy, PAF, HTN, CAD, COPD, sleep apnea, morbid obesity, HLD.  She was last seen by Dr. Harl Bowie on 06/24/2021.  She was having SOB/DOE and orthopnea.  Her weight was up 11 pounds since October 2021.  Exam for fluid status was difficult due to body habitus.  She did have some mild lower extremity edema.  Her diuretic dosing was limited by leg cramps.  Plan was to trial Lasix 20 mg daily and in 2 weeks check a basic metabolic panel, magnesium and BNP.  If ongoing shortness of breath after diuresis will need to follow-up with pulmonary to reassess lung disease.  She was following Dr. Melvyn Novas for her severe COPD.   She is here for follow-up after starting Lasix at last visit.  He states her breathing is a little better and lower extremity edema has improved.  Her weight has not changed since based on our scales today.  Today's weight is 257 and at last visit was the same.  She continues with shortness of breath.  Blood pressure is elevated today.  She states she does not measure her blood pressures at home.  Blood pressure today is 146/64. Recent echocardiogram 05/21/2021 showed EF of 60 to 65%.  No WMA's, moderate LVH.  She has chronic shortness of breath was PFTs previously demonstrating significant restrictive and disease and severe diffusion defect.  She has seen Dr. Melvyn Novas in the past.  She states she is compliant with her CPAP therapy.   Past Medical History:  Diagnosis Date   Acid reflux    Hypertension    Sleep apnea     Past Surgical History:  Procedure Laterality Date   BIOPSY  09/13/2017   Procedure: BIOPSY;  Surgeon: Danie Binder, MD;   [facility];  Service: Endoscopy;;  colon duodenum gastric   BREAST BIOPSY Right    normal result   CESAREAN SECTION     CHOLECYSTECTOMY  11/06/2012   Procedure: LAPAROSCOPIC CHOLECYSTECTOMY WITH INTRAOPERATIVE CHOLANGIOGRAM;  Surgeon: Pedro Earls, MD;  [facility];  Service: General;  Laterality: N/A;   COLONOSCOPY N/A 09/13/2017   Procedure: COLONOSCOPY;  Surgeon: Danie Binder, MD;  [facility];  Service: Endoscopy;  Laterality: N/A;  245   ESOPHAGOGASTRODUODENOSCOPY N/A 09/13/2017   Procedure: ESOPHAGOGASTRODUODENOSCOPY (EGD);  Surgeon: Danie Binder, MD;  [facility];  Service: Endoscopy;  Laterality: N/A;   POLYPECTOMY  09/13/2017   Procedure: POLYPECTOMY;  Surgeon: Danie Binder, MD;  [facility];  Service: Endoscopy;;  colon   RIGHT/LEFT HEART CATH AND CORONARY ANGIOGRAPHY N/A 01/10/2017   Procedure: Right/Left Heart Cath and Coronary Angiography;  Surgeon: Belva Crome, MD;  Location: [facility];  Service: Cardiovascular;  Laterality: N/A;   UMBILICAL HERNIA REPAIR  11/06/2012   Procedure: HERNIA REPAIR UMBILICAL ADULT;  Surgeon: Pedro Earls, MD;  [facility];  Service: General;;    Current Outpatient Medications  Medication Sig Dispense Refill   atorvastatin (LIPITOR) 40 MG tablet TAKE ONE TABLET BY MOUTH DAILY 90 tablet 3   bisoprolol (ZEBETA) 5 MG tablet TAKE ONE TABLET BY MOUTH DAILY. [place]  tablet 1   cyclobenzaprine (FLEXERIL) 10 MG tablet Take 10 mg by mouth at bedtime.     ELIQUIS 5 MG TABS tablet TAKE ONE TABLET BY MOUTH TWICE DAILY. 60 tablet 6   hydrALAZINE (APRESOLINE) 25 MG tablet Take 1 tablet (25 mg total) by mouth 2 (two) times daily. 60 tablet 3   metFORMIN (GLUCOPHAGE) 500 MG tablet Take 500 mg by mouth daily with breakfast.     Multiple Vitamins-Minerals (MULTIVITAMIN) tablet Take 1 tablet by mouth daily. Nature made brand 30 tablet    pantoprazole (PROTONIX) 40 MG tablet TAKE ONE TABLET  BY MOUTH 30 MINUTES PRIOR TO FIRST MEAL 90 tablet 3   potassium chloride SA (KLOR-CON) 20 MEQ tablet TAKE ONE TABLET BY MOUTH DAILY 90 tablet 3   telmisartan (MICARDIS) 40 MG tablet Take 1 tablet (40 mg total) by mouth daily. 30 tablet 11   furosemide (LASIX) 20 MG tablet Take 0.5 tablets (10 mg total) by mouth daily. 15 tablet 6   No current facility-administered medications for this visit.   Allergies:  Patient has no known allergies.   Social History: The patient  reports that she has never smoked. She has never used smokeless tobacco. She reports that she does not drink alcohol and does not use drugs.   Family History: The patient's family history includes Asthma in her father; Cancer in her father and sister; Heart attack in her mother; Heart attack (age of onset: 17) in her brother; Heart disease (age of onset: 29) in her mother.   ROS:  Please see the history of present illness. Otherwise, complete review of systems is positive for none.  All other systems are reviewed and negative.   Physical Exam: VS:  BP (!) 146/64   Pulse 62   Ht '5\' 5"'$  (1.651 m)   Wt 257 lb 3.2 oz (116.7 kg)   SpO2 93%   BMI 42.80 kg/m , BMI Body mass index is 42.8 kg/m.  Wt Readings from Last 3 Encounters:  07/17/21 257 lb 3.2 oz (116.7 kg)  06/24/21 257 lb 3.2 oz (116.7 kg)  05/21/21 250 lb (113.4 kg)    General: Morbidly obese patient appears comfortable at rest. Neck: Supple, no elevated JVP or carotid bruits, no thyromegaly. Lungs: Clear to auscultation, nonlabored breathing at rest. Cardiac: Regular rate and rhythm, no S3 or significant systolic murmur, no pericardial rub. Extremities: No pitting edema, distal pulses 2+. Skin: Warm and dry. Musculoskeletal: No kyphosis. Neuropsychiatric: Alert and oriented x3, affect grossly appropriate.  ECG:  EKG 05/21/2021 sinus bradycardia with a rate of 59.  Recent Labwork: 07/08/2021: B Natriuretic Peptide 12.0; BUN 14; Creatinine, Ser 1.10; Magnesium  2.0; Potassium 4.6; Sodium 140     Component Value Date/Time   CHOL 131 07/13/2017 1200   TRIG 108 07/13/2017 1200   HDL 48 07/13/2017 1200   CHOLHDL 2.7 07/13/2017 1200   VLDL 22 07/13/2017 1200   LDLCALC 61 07/13/2017 1200    Other Studies Reviewed Today:  Echocardiogram 05/21/2021  1. Left ventricular ejection fraction, by estimation, is 60 to 65%. The left ventricle has normal function. The left ventricle has no regional wall motion abnormalities. There is moderate left ventricular hypertrophy. Left ventricular diastolic parameters are indeterminate. The average left ventricular global longitudinal strain is -17.2 %. The global longitudinal strain is normal. 2. Right ventricular systolic function is normal. The right ventricular size is normal. 3. Left atrial size was mildly dilated. 4. The mitral valve is normal in structure. No evidence  of mitral valve regurgitation. No evidence of mitral stenosis. 5. The aortic valve was not well visualized. Aortic valve regurgitation is not visualized. No aortic stenosis is present. 6. The inferior vena cava is normal in size with greater than 50% respiratory variability, suggesting right atrial pressure of 3 mmHg. Comparison(s): Echocardiogram done 07/06/17 showed an EF of 50-55%.     01/2017 cath Dist LAD lesion, 100 %stenosed. Prox RCA lesion, 40 %stenosed. 3rd Mrg lesion, 40 %stenosed. There is severe left ventricular systolic dysfunction. The left ventricular ejection fraction is less than 25% by visual estimate. LV end diastolic pressure is severely elevated. Hemodynamic findings consistent with moderate pulmonary hypertension.   Acute atrial fibrillation developed on the cath table during right heart/right atrial pressure recording. Rate 118 bpm. Atrial fibrillation was treated with one bolus of IV amiodarone. 40% proximal RCA stenosis. Tortuous but normal circumflex 100% distal/apical LAD stenosis. This lesion is probably  chronic but difficult to determine age. There is also a possibility that it is acute embolic occlusion from a catheter-based thrombus. For that reason additional heparin and then Aggrastat bolus was given as the patient complained of chest pressure post procedure. Chest pressure post procedure is similar to prior episodes. Severe LV dysfunction with EF less than 25%. Contraction pattern appears global. Arterial blood gas demonstrated severe CO2 retention, pCO2 67.       01/2017 echo Study Conclusions   - Left ventricle: The cavity size was mildly dilated. Wall   thickness was increased in a pattern of moderate LVH. Systolic   function was moderately reduced. The estimated ejection fraction   was in the range of 35% to 40%. Diffuse hypokinesis. - Aortic valve: Mildly calcified annulus. Trileaflet; mildly   thickened leaflets. - Mitral valve: There was mild regurgitation. - Left atrium: The atrium was moderately dilated. - Atrial septum: The septum bowed from left to right, consistent   with increased left atrial pressure. - Pulmonic valve: There was mild regurgitation.     07/2017 echo Study Conclusions   - Left ventricle: The cavity size was normal. Wall thickness was   increased increased in a pattern of mild to moderate LVH.   Systolic function was normal. The estimated ejection fraction was   in the range of 50% to 55%. Diastolic function is abnormal,   indeterminant grade. LVOT VTI and Vmax likely underestimated   based on angle of acquisition. - Aortic valve: Valve area (VTI): 1.56 cm^2. Valve area (Vmax):   1.84 cm^2. - Technically difficult study.    Assessment and Plan:  1. Chronic combined systolic (congestive) and diastolic (congestive) heart failure ([facility])   2. Essential hypertension   3. Edema, unspecified type   4. Acute on chronic systolic (congestive) heart failure ([facility])   5. SOB (shortness of breath)   6. Medication management    1. Chronic combined systolic  (congestive) and diastolic (congestive) heart failure (H[facility]) She states taking the Lasix helped her breathing some but she still having shortness of breath.  She states the edema in her legs has decreased since starting the Lasix.  Recent basic metabolic panel showed creatinine of 1.10 and GFR of 54.  This was a decrease from 05/21/2021 when her creatinine was 0.93 and GFR was greater than 60.  Please decrease Lasix to 10 mg daily and check basic metabolic panel and magnesium in 2 weeks.  Continue bisoprolol 5 mg daily.  Continue telmisartan 40 mg daily.  Patient weights have been unaffected by Lasix.  Today's weight  is 257 versus 257 at last check.  Please refer to pulmonology Dr. Melvyn Novas to reassess lung disease.  2. Essential hypertension Blood pressure is elevated today at 146/64.  Continue bisoprolol 5 mg daily.  Continue telmisartan 40 mg daily.  Start hydralazine 25 mg p.o. twice daily.  Advised patient goal of blood pressure is 130/80 or less.  Advised patient to start checking her blood pressure to assess trends.  3. Edema, unspecified type Lower extremity edema has decreased per patient.  We are decreasing Lasix to 10 mg daily due to decreased renal function.  Plan is to reassess renal function/electrolytes in 2 weeks with basic metabolic panel and magnesium  Medication Adjustments/Labs and Tests Ordered: Current medicines are reviewed at length with the patient today.  Concerns regarding medicines are outlined above.   Disposition: Follow-up with Dr. Harl Bowie or APP 3 months  Signed, Levell July, NP 07/17/2021 11:00 AM    [facility], [place], [place] [address] Phone: [phone]; Fax: [phone]

## 2021-07-17 ENCOUNTER — Ambulatory Visit (INDEPENDENT_AMBULATORY_CARE_PROVIDER_SITE_OTHER): Payer: Medicare Other | Admitting: Family Medicine

## 2021-07-17 ENCOUNTER — Encounter: Payer: Self-pay | Admitting: Family Medicine

## 2021-07-17 ENCOUNTER — Other Ambulatory Visit: Payer: Self-pay

## 2021-07-17 VITALS — BP 146/64 | HR 62 | Ht 65.0 in | Wt 257.2 lb

## 2021-07-17 DIAGNOSIS — I5042 Chronic combined systolic (congestive) and diastolic (congestive) heart failure: Secondary | ICD-10-CM

## 2021-07-17 DIAGNOSIS — I5023 Acute on chronic systolic (congestive) heart failure: Secondary | ICD-10-CM | POA: Diagnosis not present

## 2021-07-17 DIAGNOSIS — I10 Essential (primary) hypertension: Secondary | ICD-10-CM | POA: Diagnosis not present

## 2021-07-17 DIAGNOSIS — R609 Edema, unspecified: Secondary | ICD-10-CM

## 2021-07-17 DIAGNOSIS — R0602 Shortness of breath: Secondary | ICD-10-CM

## 2021-07-17 DIAGNOSIS — Z79899 Other long term (current) drug therapy: Secondary | ICD-10-CM | POA: Diagnosis not present

## 2021-07-17 MED ORDER — HYDRALAZINE HCL 25 MG PO TABS: 25.0000 mg | ORAL_TABLET | Freq: Two times a day (BID) | ORAL | 3 refills | Status: DC

## 2021-07-17 MED ORDER — FUROSEMIDE 20 MG PO TABS: 10.0000 mg | ORAL_TABLET | Freq: Every day | ORAL | 6 refills | Status: DC

## 2021-07-17 NOTE — Patient Instructions (Addendum)
Medication Instructions:  Your physician has recommended you make the following change in your medication:  Decrease furosemide to 10 mg daily (break your 20 mg tablet in 1/2) Start hydralazine 25 mg twice daily Continue other medications the same  Labwork: BMET & Mg in 2 weeks at [facility] (07/31/2021) Non-fasting No appointment needed  Testing/Procedures: none  Follow-Up: Your physician recommends that you schedule a follow-up appointment in: 3 months  Any Other Special Instructions Will Be Listed Below (If Applicable). You have been referred to [facility] Pulmonology  If you need a refill on your cardiac medications before your next appointment, please call your pharmacy.

## 2021-07-23 DIAGNOSIS — Z299 Encounter for prophylactic measures, unspecified: Secondary | ICD-10-CM | POA: Diagnosis not present

## 2021-07-23 DIAGNOSIS — E1165 Type 2 diabetes mellitus with hyperglycemia: Secondary | ICD-10-CM | POA: Diagnosis not present

## 2021-07-23 DIAGNOSIS — I4891 Unspecified atrial fibrillation: Secondary | ICD-10-CM | POA: Diagnosis not present

## 2021-07-23 DIAGNOSIS — I10 Essential (primary) hypertension: Secondary | ICD-10-CM | POA: Diagnosis not present

## 2021-07-23 DIAGNOSIS — I5022 Chronic systolic (congestive) heart failure: Secondary | ICD-10-CM | POA: Diagnosis not present

## 2021-07-31 ENCOUNTER — Other Ambulatory Visit (HOSPITAL_COMMUNITY)
Admission: RE | Admit: 2021-07-31 | Discharge: 2021-07-31 | Disposition: A | Discharge status: Home / Self Care | Payer: Medicare Other | Source: Ambulatory Visit | Attending: Family Medicine | Admitting: Family Medicine

## 2021-07-31 DIAGNOSIS — R0602 Shortness of breath: Secondary | ICD-10-CM

## 2021-07-31 DIAGNOSIS — I5042 Chronic combined systolic (congestive) and diastolic (congestive) heart failure: Secondary | ICD-10-CM

## 2021-07-31 DIAGNOSIS — Z79899 Other long term (current) drug therapy: Secondary | ICD-10-CM

## 2021-07-31 DIAGNOSIS — I10 Essential (primary) hypertension: Secondary | ICD-10-CM | POA: Diagnosis not present

## 2021-07-31 LAB — BASIC METABOLIC PANEL
Anion gap: 7 (ref 5–15)
BUN: 15 mg/dL (ref 8–23)
CO2: 28 mmol/L (ref 22–32)
Calcium: 8.8 mg/dL — ABNORMAL LOW (ref 8.9–10.3)
Chloride: 104 mmol/L (ref 98–111)
Creatinine, Ser: 1.08 mg/dL — ABNORMAL HIGH (ref 0.44–1.00)
GFR, Estimated: 56 mL/min — ABNORMAL LOW (ref >60)
Glucose, Bld: 152 mg/dL — ABNORMAL HIGH (ref 70–99)
Potassium: 4.2 mmol/L (ref 3.5–5.1)
Sodium: 139 mmol/L (ref 135–145)

## 2021-07-31 LAB — MAGNESIUM: Magnesium: 2 mg/dL (ref 1.7–2.4)

## 2021-08-03 ENCOUNTER — Telehealth: Payer: Self-pay | Admitting: *Deleted

## 2021-08-03 NOTE — Telephone Encounter (Signed)
-----   Message from Merlene Laughter, RN sent at 08/03/2021 11:51 AM EDT -----  ----- Message ----- From: Laurine Blazer, LPN Sent: [id number]  11:07 AM EDT To: Merlene Laughter, RN   ----- Message ----- From: Verta Ellen., NP Sent: 07/31/2021   1:27 PM EDT To: Laurine Blazer, LPN  Please call patient and let her know the lab work shows her kidney function is improving getting close to normal.  Her blood sugar is still up a little at 152.  Thank you  Verta Ellen, NP  07/31/2021 1:27 PM

## 2021-08-03 NOTE — Telephone Encounter (Signed)
Patient notified   Copy sent to PCP

## 2021-08-06 ENCOUNTER — Other Ambulatory Visit: Payer: Self-pay | Admitting: Internal Medicine

## 2021-08-06 DIAGNOSIS — Z139 Encounter for screening, unspecified: Secondary | ICD-10-CM

## 2021-08-07 ENCOUNTER — Ambulatory Visit
Admission: RE | Admit: 2021-08-07 | Discharge: 2021-08-07 | Disposition: A | Discharge status: Home / Self Care | Payer: Medicare Other | Source: Ambulatory Visit | Attending: Internal Medicine | Admitting: Internal Medicine

## 2021-08-07 ENCOUNTER — Other Ambulatory Visit: Payer: Self-pay

## 2021-08-07 DIAGNOSIS — Z139 Encounter for screening, unspecified: Secondary | ICD-10-CM

## 2021-08-07 DIAGNOSIS — Z1231 Encounter for screening mammogram for malignant neoplasm of breast: Secondary | ICD-10-CM | POA: Diagnosis not present

## 2021-09-15 ENCOUNTER — Telehealth: Payer: Self-pay

## 2021-09-15 ENCOUNTER — Other Ambulatory Visit: Payer: Self-pay

## 2021-09-15 ENCOUNTER — Ambulatory Visit (INDEPENDENT_AMBULATORY_CARE_PROVIDER_SITE_OTHER): Payer: Medicare Other | Admitting: Internal Medicine

## 2021-09-15 ENCOUNTER — Encounter: Payer: Self-pay | Admitting: Internal Medicine

## 2021-09-15 DIAGNOSIS — Z9989 Dependence on other enabling machines and devices: Secondary | ICD-10-CM | POA: Diagnosis not present

## 2021-09-15 DIAGNOSIS — I2510 Atherosclerotic heart disease of native coronary artery without angina pectoris: Secondary | ICD-10-CM

## 2021-09-15 DIAGNOSIS — R0609 Other forms of dyspnea: Secondary | ICD-10-CM

## 2021-09-15 DIAGNOSIS — J4530 Mild persistent asthma, uncomplicated: Secondary | ICD-10-CM | POA: Diagnosis not present

## 2021-09-15 DIAGNOSIS — G4733 Obstructive sleep apnea (adult) (pediatric): Secondary | ICD-10-CM

## 2021-09-15 MED ORDER — TRELEGY ELLIPTA 100-62.5-25 MCG/INH IN AEPB: 1.0000 | INHALATION_SPRAY | Freq: Every day | RESPIRATORY_TRACT | 0 refills | Status: DC

## 2021-09-15 NOTE — Progress Notes (Signed)
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 no problem thru childhood or childbirth with last child born around 1980 and back to 150 lb baseline  and doing great until 2000 admitted with Pneumonia to [facility] with dx of chf and since then downhill with mild asthma on pfts 2018 and restictive changes  with low ERV so referred to pulmonary clinic in [facility]  07/17/2020 by Liz Beach NP     History of Present Illness  07/17/2020  Pulmonary/ 1st office eval/ Pamela Soto / [facility] / trelegy  Chief Complaint  Patient presents with   Consult    shortness of breath with exertion  Dyspnea:  Started walking at  [employer] a few weeks ago p lost wt still struggle to do more than one aisle due to sob  Cough: none  Sleep: 2 pillows/ cpap and 02  SABA use: none now  02 hs ? Flow/ not using daytime at all rec Trelegy one click each am first thing and then blow out thru nose - stop it after a month if not convinced  Please schedule a follow up office visit in 6 - 8 weeks with PFTs and same day ov , call sooner if needed  - do not use the Trelegy that morning    09/11/2020  f/u ov/[place] office/Pamela Soto re:  Doe with minimal asthma / no change off trelegy  Chief Complaint  Patient presents with   Follow-up    PFT's done 09/09/20.  Pt c/o PND and prod cough with clear sputum. She has not used her albuterol neb.     Dyspnea:  Shopping at [employer] now  Cough: urge to clear throat/ sense of pnds s other evidence of obvious sinus dz on ACi  Sleeping: sleeps on side/ bed is flat and one pillow SABA use: has neb, not recently used it  02: hs  Rec Stop lisinopril and your drainage, throat tickle, cough should gradually improve over the next couple of weeks  micardis (ibesartan)  40 mg  one half each am  To get the most out of exercise, you need to be continuously aware that you are short of breath, but never out of breath, for 30  minutes daily. Pulmonary follow up is as needed      09/15/2021  f/u ov/Pamela Soto re: min asthma  maint on no maint rx and off acei   Chief Complaint  Patient presents with   Follow-up    SOB has worsened since last OV Oct 2021. Notices the SOB most after exertion   Dyspnea:  not shopping as much due to breathing, no longer doing mb x month x 50 ft falt Cough: tickle in throat not as bad now, still occ has it off acei but not nearly as freq Sleeping: flat bed one pillow on side no noct resp cc SABA use: has trelegy but hasn't started using 02:2lpm hs cpap/02  per [facility] / not using it as much, doesn't think it's working well  Covid status:  covid vax x 3  Chest pain x sev months  of midline ant  below L cavicle  x 5-10 min every changes with shoulder position esp noct not worse with ex or breathing    No obvious day to day or daytime variability or assoc excess/ purulent sputum or mucus plugs or hemoptysis or cp or chest tightness, subjective wheeze or overt sinus  or hb symptoms.   Sleeping on c without nocturnal  or early am exacerbation  of respiratory  c/o's or need for noct saba. Also denies any obvious fluctuation of symptoms with weather or environmental changes or other aggravating or alleviating factors except as outlined above   No unusual exposure hx or h/o childhood pna/ asthma or knowledge of premature birth.  Current Allergies, Complete Past Medical History, Past Surgical History, Family History, and Social History were reviewed in [employer] record.  ROS  The following are not active complaints unless bolded Hoarseness, sore throat, dysphagia, dental problems, itching, sneezing,  nasal congestion or discharge of excess mucus or purulent secretions, ear ache,   fever, chills, sweats, unintended wt loss or wt gain, classically pleuritic or exertional cp,  orthopnea pnd or arm/hand swelling  or leg swelling, presyncope, palpitations, abdominal pain, anorexia,  nausea, vomiting, diarrhea  or change in bowel habits or change in bladder habits, change in stools or change in urine, dysuria, hematuria,  rash, arthralgias L shoulder, visual complaints, headache, numbness, weakness or ataxia or problems with walking or coordination,  change in mood or  memory.        Current Meds  Medication Sig   atorvastatin (LIPITOR) 40 MG tablet TAKE ONE TABLET BY MOUTH DAILY   bisoprolol (ZEBETA) 5 MG tablet TAKE ONE TABLET BY MOUTH DAILY.   ELIQUIS 5 MG TABS tablet TAKE ONE TABLET BY MOUTH TWICE DAILY.   furosemide (LASIX) 20 MG tablet Take 0.5 tablets (10 mg total) by mouth daily.   hydrALAZINE (APRESOLINE) 25 MG tablet Take 1 tablet (25 mg total) by mouth 2 (two) times daily.   potassium chloride SA (KLOR-CON) 20 MEQ tablet TAKE ONE TABLET BY MOUTH DAILY   telmisartan (MICARDIS) 40 MG tablet Take 1 tablet (40 mg total) by mouth daily.               Past Medical History:  Diagnosis Date   Acid reflux    Hypertension    Sleep apnea       Objective:      09/15/2021     268    09/11/20 246 lb (111.6 kg)  07/17/20 245 lb 6.4 oz (111.3 kg)  12/11/19 269 lb (122 kg)     Vital signs reviewed  09/15/2021  - Note at rest 02 sats  94% on RA    General appearance:    obese somber wf nad   HEENT : pt wearing mask not removed for exam due to covid -19 concerns.    NECK :  without JVD/Nodes/TM/ nl carotid upstrokes bilaterally   LUNGS: no acc muscle use,  Nl contour chest which is clear to A and P bilaterally without cough on insp or exp maneuvers   CV:  RRR  no s3 or murmur or increase in P2, and no edema   ABD:  obese soft and nontender with nl inspiratory excursion in the supine position. No bruits or organomegaly appreciated, bowel sounds nl  MS:  Nl gait/ ext warm without deformities, calf tenderness, cyanosis or clubbing No obvious joint restrictions   SKIN: warm and dry without lesions    NEURO:  alert, approp, nl sensorium with  no motor  or cerebellar deficits apparent.        CXR PA and Lateral:   09/15/2021 :    I personally reviewed images and agree with radiology impression as follows:    Ordered but not done  Assessment

## 2021-09-15 NOTE — Telephone Encounter (Signed)
Called patient to set up sleep consult appt in [place] office with VS or Ra per MW as discussed with patient. LMTCB

## 2021-09-15 NOTE — Assessment & Plan Note (Addendum)
pfts restrictive with ERV 34%  03/2017  C/w effects of obesity on lung function   Body mass index is 43.95 kg/m.  -  trending up  Lab Results  Component Value Date   TSH 2.186 07/13/2017      Contributing to doe and risk of GERD >>>   reviewed the need and the process to achieve and maintain neg calorie balance > defer f/u primary care including intermittently monitoring thyroid status      Each maintenance medication was reviewed in detail including emphasizing most importantly the difference between maintenance and prns and under what circumstances the prns are to be triggered using an action plan format where appropriate.  Total time for H and P, chart review, counseling, reviewing dpi/elipta device(s) , directly observing portions of ambulatory 02 saturation study/ and generating customized AVS unique to this office visit / same day charting  > 40 min

## 2021-09-15 NOTE — Patient Instructions (Addendum)
Trelegy one click each am  x at least a week to see if helps breathing   To get the most out of exercise, you need to be continuously aware that you are short of breath, but never out of breath, for at least 30 minutes daily. As you improve, it will actually be easier for you to do the same amount of exercise  in  30 minutes so always push to the level where you are short of breath.    Make sure your 02 saturations stay above 90% at peak exercise    Please remember to go to the  x-ray department  for your tests - we will call you with the results when they are available    Follow up with our sleep medicine doctors here - next available appt 100

## 2021-09-15 NOTE — Assessment & Plan Note (Signed)
Onset 2000 with pna/ chf/ wt gain - Echo 09/13/17  Mild / mod LVH with nl LA -  07/17/2020   Walked RA  approx   600 ft  @ moderte to fast pace  stopped due to  End of study though sob at end and sats 93%    - PFTs 03/09/17 restrictive with minimal asthma component and ERV 34%  - PFTs 09/09/20 unchanged though 21% better saba  - 09/11/2020  Change the lisinopril to  ibesartan 40 mg one half daily  - Echo 05/21/21 Mod  LVH with mild LAE  - 09/15/2021 worse doe assoc with another 22 lb wt gain over prior eval = 268 lb - 09/15/2021   Walked on RA x  3  lap(s) =  approx 450  @ fast pace, stopped due to end study but sob  with lowest 02 sats 91%    Already on eliquis so unlikely PE and clearly feeling the effects of additonal wt gain/ deconditioning and likely diastolic dysfunction    rx : reconditioning with submaximal

## 2021-09-15 NOTE — Assessment & Plan Note (Signed)
Use of CPAP per pulmonology Pamela Soto) - Sleep study done 04/2017 - referred [facility] pulmonary/sleep medicine 09/15/2021

## 2021-09-15 NOTE — Assessment & Plan Note (Signed)
Never smoker - PFT's  03/09/17 @ wt 254   FEV1 1.32 (62 % ) ratio 0.76  p 18 % improvement from saba p albuterol prior to study with DLCO  17.75 (67%) corrects to 6.3 (127%)  for alv volume and FV curve min concavity and ERV 34%  - PFT's  09/09/2020 @ wt 246   FEV1 1.32 (65 % ) ratio 0.76  p 21 % improvement from saba p 16.47 prior to study with DLCO  16.47 (79%) corrects to 5.49 (131%)  for alv volume and FV curve mild concavity and ERV 46%   - 09/15/2021  After extensive coaching inhaler device,  effectiveness =    90% with dpi > try trelegy 388 one click each am and d/c p sample if no better doe   She does have a small reversible component so likely asthma but not sure it explains any of her symptoms (see doe )    Pulmonary f/u can be prn

## 2021-09-16 ENCOUNTER — Ambulatory Visit (HOSPITAL_COMMUNITY)
Admission: RE | Admit: 2021-09-16 | Discharge: 2021-09-16 | Disposition: A | Discharge status: Home / Self Care | Payer: Medicare Other | Source: Ambulatory Visit | Attending: Internal Medicine | Admitting: Internal Medicine

## 2021-09-16 DIAGNOSIS — R0600 Dyspnea, unspecified: Secondary | ICD-10-CM | POA: Diagnosis not present

## 2021-09-16 DIAGNOSIS — R0609 Other forms of dyspnea: Secondary | ICD-10-CM | POA: Insufficient documentation

## 2021-09-28 ENCOUNTER — Other Ambulatory Visit: Payer: Self-pay | Admitting: Internal Medicine

## 2021-09-29 ENCOUNTER — Other Ambulatory Visit: Payer: Self-pay | Admitting: Cardiology

## 2021-10-19 NOTE — Progress Notes (Signed)
Cardiology Office Note  Date: 10/20/2021   ID: Pamela Soto, DOB 26-May-1951, MRN [id number]  PCP:  Berenice Primas  Cardiologist:  Carlyle Dolly, MD Electrophysiologist:  None   Chief Complaint: 3-week follow-up  History of Present Illness: Pamela Soto is a 70 y.o. female with a history of systolic heart failure, nonischemic cardiomyopathy, PAF, HTN, CAD, COPD, sleep apnea, morbid obesity, HLD.  She was last seen by Dr. Harl Bowie on 06/24/2021.  She was having SOB/DOE and orthopnea.  Her weight was up 11 pounds since October 2021.  Exam for fluid status was difficult due to body habitus.  She did have some mild lower extremity edema.  Her diuretic dosing was limited by leg cramps.  Plan was to trial Lasix 20 mg daily and in 2 weeks check a basic metabolic panel, magnesium and BNP.  If ongoing shortness of breath after diuresis will need to follow-up with pulmonary to reassess lung disease.  She was following Dr. Melvyn Novas for her severe COPD.   She was last here ollow-up after starting Lasix at last visit.  She states her breathing is a little better and lower extremity edema has improved.  Her weight had not changed since based on our scales today.  Weight at visit was 257 and at last visit was the same.  She continues with shortness of breath.  Blood pressure was elevated.  She did not measure her blood pressures at home.  Blood pressure was 146/64.  Recent echocardiogram 05/21/2021 showed EF of 60 to 65%.  No WMA's, moderate LVH.  She has history of chronic shortness of breath with PFTs previously demonstrating significant restrictive and disease and severe diffusion defect.  She has seen Dr. Melvyn Novas in the past.  She states she is compliant with her CPAP therapy.   She is here for 99-month follow-up.  She admits she is not taking her medicines as she should.  She sometimes does not take her antihypertensive medications.  She was surprised to hear she was supposed to take her hydralazine  twice daily instead of once daily.  She recently saw Dr. Melvyn Novas for her shortness of breath he stated she needed to lose some weight and increase exercise.  She was to continue her Trelegy inhaler.  He mentioned her morbid obesity was contributing to her shortness of breath.  She continues to have shortness of breath and some chest discomfort.  She does have a history of CAD.  She had a cardiac catheterization back in February 2018. with evidence of distal LAD lesion 100%, proximal RCA 40%, third marginal 40%.  There was severe global LV systolic dysfunction with LV EF 25%.  Hemodynamic findings consistent with moderate pulmonary hypertension.[name]  Her EF improved by August 2018 to 50 to 55%.  She had a subsequent echocardiogram on 05/21/2021 with EF of 60 to 65%.  No WMA's, moderate LVH,    Past Medical History:  Diagnosis Date   Acid reflux    Hypertension    Sleep apnea     Past Surgical History:  Procedure Laterality Date   BIOPSY  09/13/2017   Procedure: BIOPSY;  Surgeon: Danie Binder, MD;  Location: [facility];  Service: Endoscopy;;  colon duodenum gastric   BREAST BIOPSY Right    normal result   CESAREAN SECTION     CHOLECYSTECTOMY  11/06/2012   Procedure: LAPAROSCOPIC CHOLECYSTECTOMY WITH INTRAOPERATIVE CHOLANGIOGRAM;  Surgeon: Pedro Earls, MD;  Location: [facility];  Service: General;  Laterality: N/A;  COLONOSCOPY N/A 09/13/2017   Procedure: COLONOSCOPY;  Surgeon: Danie Binder, MD;  Location: [facility];  Service: Endoscopy;  Laterality: N/A;  245   ESOPHAGOGASTRODUODENOSCOPY N/A 09/13/2017   Procedure: ESOPHAGOGASTRODUODENOSCOPY (EGD);  Surgeon: Danie Binder, MD;  Location: [facility];  Service: Endoscopy;  Laterality: N/A;   POLYPECTOMY  09/13/2017   Procedure: POLYPECTOMY;  Surgeon: Danie Binder, MD;  Location: [facility];  Service: Endoscopy;;  colon   RIGHT/LEFT HEART CATH AND CORONARY ANGIOGRAPHY N/A 01/10/2017   Procedure: Right/Left Heart Cath and Coronary  Angiography;  Surgeon: Belva Crome, MD;  Location: [facility];  Service: Cardiovascular;  Laterality: N/A;   UMBILICAL HERNIA REPAIR  11/06/2012   Procedure: HERNIA REPAIR UMBILICAL ADULT;  Surgeon: Pedro Earls, MD;  Location: [facility];  Service: General;;    Current Outpatient Medications  Medication Sig Dispense Refill   atorvastatin (LIPITOR) 40 MG tablet TAKE ONE TABLET BY MOUTH DAILY 90 tablet 3   bisoprolol (ZEBETA) 5 MG tablet TAKE ONE TABLET BY MOUTH DAILY. 90 tablet 1   ELIQUIS 5 MG TABS tablet TAKE ONE TABLET BY MOUTH TWICE DAILY. 60 tablet 6   Fluticasone-Umeclidin-Vilant (TRELEGY ELLIPTA) 100-62.5-25 MCG/INH AEPB Inhale 1 puff into the lungs daily. 60 each 0   furosemide (LASIX) 20 MG tablet Take 0.5 tablets (10 mg total) by mouth daily. 15 tablet 6   hydrALAZINE (APRESOLINE) 25 MG tablet Take 1 tablet (25 mg total) by mouth 2 (two) times daily. 60 tablet 3   potassium chloride SA (KLOR-CON) 20 MEQ tablet TAKE ONE TABLET BY MOUTH DAILY 90 tablet 3   telmisartan (MICARDIS) 40 MG tablet TAKE ONE TABLET BY MOUTH ONCE DAILY 30 tablet 11   No current facility-administered medications for this visit.   Allergies:  Patient has no known allergies.   Social History: The patient  reports that she has never smoked. She has never used smokeless tobacco. She reports that she does not drink alcohol and does not use drugs.   Family History: The patient's family history includes Asthma in her father; Breast cancer (age of onset: 33) in her sister; Cancer in her father and sister; Heart attack in her mother; Heart attack (age of onset: 69) in her brother; Heart disease (age of onset: 24) in her mother.   ROS:  Please see the history of present illness. Otherwise, complete review of systems is positive for none.  All other systems are reviewed and negative.   Physical Exam: VS:  BP (!) 144/70   Pulse 68   Ht 5\' 5"  (1.651 m)   Wt 262 lb 12.8 oz (119.2 kg)   SpO2 97%   BMI 43.73  kg/m , BMI Body mass index is 43.73 kg/m.  Wt Readings from Last 3 Encounters:  10/20/21 262 lb 12.8 oz (119.2 kg)  09/15/21 268 lb 3.2 oz (121.7 kg)  07/17/21 257 lb 3.2 oz (116.7 kg)    General: Morbidly obese patient appears comfortable at rest. Neck: Supple, no elevated JVP or carotid bruits, no thyromegaly. Lungs: Clear to auscultation, nonlabored breathing at rest. Cardiac: Regular rate and rhythm, no S3 or significant systolic murmur, no pericardial rub. Extremities: No pitting edema, distal pulses 2+. Skin: Warm and dry. Musculoskeletal: No kyphosis. Neuropsychiatric: Alert and oriented x3, affect grossly appropriate.  ECG:  EKG 05/21/2021 sinus bradycardia with a rate of 59.  Recent Labwork: 07/08/2021: B Natriuretic Peptide 12.0 07/31/2021: BUN 15; Creatinine, Ser 1.08; Magnesium 2.0; Potassium 4.2; Sodium  139     Component Value Date/Time   CHOL 131 07/13/2017 1200   TRIG 108 07/13/2017 1200   HDL 48 07/13/2017 1200   CHOLHDL 2.7 07/13/2017 1200   VLDL 22 07/13/2017 1200   LDLCALC 61 07/13/2017 1200    Other Studies Reviewed Today:  Echocardiogram 05/21/2021  1. Left ventricular ejection fraction, by estimation, is 60 to 65%. The left ventricle has normal function. The left ventricle has no regional wall motion abnormalities. There is moderate left ventricular hypertrophy. Left ventricular diastolic parameters are indeterminate. The average left ventricular global longitudinal strain is -17.2 %. The global longitudinal strain is normal. 2. Right ventricular systolic function is normal. The right ventricular size is normal. 3. Left atrial size was mildly dilated. 4. The mitral valve is normal in structure. No evidence of mitral valve regurgitation. No evidence of mitral stenosis. 5. The aortic valve was not well visualized. Aortic valve regurgitation is not visualized. No aortic stenosis is present. 6. The inferior vena cava is normal in size with greater than 50%  respiratory variability, suggesting right atrial pressure of 3 mmHg. Comparison(s): Echocardiogram done 07/06/17 showed an EF of 50-55%.     01/2017 cath Dist LAD lesion, 100 %stenosed. Prox RCA lesion, 40 %stenosed. 3rd Mrg lesion, 40 %stenosed. There is severe left ventricular systolic dysfunction. The left ventricular ejection fraction is less than 25% by visual estimate. LV end diastolic pressure is severely elevated. Hemodynamic findings consistent with moderate pulmonary hypertension.   Acute atrial fibrillation developed on the cath table during right heart/right atrial pressure recording. Rate 118 bpm. Atrial fibrillation was treated with one bolus of IV amiodarone. 40% proximal RCA stenosis. Tortuous but normal circumflex 100% distal/apical LAD stenosis. This lesion is probably chronic but difficult to determine age. There is also a possibility that it is acute embolic occlusion from a catheter-based thrombus. For that reason additional heparin and then Aggrastat bolus was given as the patient complained of chest pressure post procedure. Chest pressure post procedure is similar to prior episodes. Severe LV dysfunction with EF less than 25%. Contraction pattern appears global. Arterial blood gas demonstrated severe CO2 retention, pCO2 67.       01/2017 echo Study Conclusions   - Left ventricle: The cavity size was mildly dilated. Wall   thickness was increased in a pattern of moderate LVH. Systolic   function was moderately reduced. The estimated ejection fraction   was in the range of 35% to 40%. Diffuse hypokinesis. - Aortic valve: Mildly calcified annulus. Trileaflet; mildly   thickened leaflets. - Mitral valve: There was mild regurgitation. - Left atrium: The atrium was moderately dilated. - Atrial septum: The septum bowed from left to right, consistent   with increased left atrial pressure. - Pulmonic valve: There was mild regurgitation.     07/2017 echo Study  Conclusions   - Left ventricle: The cavity size was normal. Wall thickness was   increased increased in a pattern of mild to moderate LVH.   Systolic function was normal. The estimated ejection fraction was   in the range of 50% to 55%. Diastolic function is abnormal,   indeterminant grade. LVOT VTI and Vmax likely underestimated   based on angle of acquisition. - Aortic valve: Valve area (VTI): 1.56 cm^2. Valve area (Vmax):   1.84 cm^2. - Technically difficult study.    Assessment and Plan:  1. Chronic combined systolic (congestive) and diastolic (congestive) heart failure ([place])   2. Essential hypertension   3. Edema, unspecified type  4. Paroxysmal atrial fibrillation (HCC)   5. SOB (shortness of breath)   6. Chest pain, unspecified type     1. Chronic combined systolic (congestive) and diastolic (congestive) heart failure (HCC) Continues with shortness of breath.  Her repeat echo and June 2022 showed normalized EF  of 60 to 65%.  No WMA's, moderate LVH.  She has not been totally compliant with some of her medications per her statement.  I advised her all of these medications were important.  Continue telmisartan 40 mg daily, continue furosemide 10 mg daily.  Continue hydralazine 25 mg p.o. twice daily, continue potassium supplementation 20 mEq daily.  Continue bisoprolol 5 mg daily.  2. Essential hypertension Blood pressure is elevated today at 146/64.  Continue bisoprolol 5 mg daily.  Continue telmisartan 40 mg daily.  Start hydralazine 25 mg p.o. twice daily.  Advised patient goal of blood pressure is 130/80 or less.  Advised patient to start checking her blood pressure to assess trends.  3. Edema, unspecified type Lower extremity edema has decreased per patient.  Continue Lasix to 10 mg daily   4.  PAF Heart rate regular at 68 today.  Continue bisoprolol 5 mg p.o. daily.  Continue Eliquis 5 mg p.o. twice daily.  No bleeding on Eliquis.  5.  Chest discomfort, shortness of  breath Continues with shortness of breath and some chest discomfort.  She does have a history of CAD.  See cardiac cath report above from February 2018.  Please get a Lexiscan stress test to assess for ischemic etiology for chest discomfort and shortness of breath.  Medication Adjustments/Labs and Tests Ordered: Current medicines are reviewed at length with the patient today.  Concerns regarding medicines are outlined above.   Disposition: Follow-up with Dr. Harl Bowie or APP 2 months  Signed, Levell July, NP 10/20/2021 2:16 PM    [facility] [address], [place], [place] [address] Phone: [phone]; Fax: [phone] Failure

## 2021-10-20 ENCOUNTER — Telehealth: Payer: Self-pay | Admitting: Family Medicine

## 2021-10-20 ENCOUNTER — Encounter: Payer: Self-pay | Admitting: *Deleted

## 2021-10-20 ENCOUNTER — Encounter: Payer: Self-pay | Admitting: Family Medicine

## 2021-10-20 ENCOUNTER — Ambulatory Visit (INDEPENDENT_AMBULATORY_CARE_PROVIDER_SITE_OTHER): Payer: Medicare Other | Admitting: Family Medicine

## 2021-10-20 VITALS — BP 144/70 | HR 68 | Ht 65.0 in | Wt 262.8 lb

## 2021-10-20 DIAGNOSIS — R609 Edema, unspecified: Secondary | ICD-10-CM

## 2021-10-20 DIAGNOSIS — I480 Paroxysmal atrial fibrillation: Secondary | ICD-10-CM | POA: Diagnosis not present

## 2021-10-20 DIAGNOSIS — R079 Chest pain, unspecified: Secondary | ICD-10-CM | POA: Diagnosis not present

## 2021-10-20 DIAGNOSIS — R0602 Shortness of breath: Secondary | ICD-10-CM | POA: Diagnosis not present

## 2021-10-20 DIAGNOSIS — I10 Essential (primary) hypertension: Secondary | ICD-10-CM | POA: Diagnosis not present

## 2021-10-20 DIAGNOSIS — I5042 Chronic combined systolic (congestive) and diastolic (congestive) heart failure: Secondary | ICD-10-CM

## 2021-10-20 NOTE — Patient Instructions (Addendum)
Medication Instructions:  Your physician recommends that you continue on your current medications as directed. Please refer to the Current Medication list given to you today.  Labwork: none  Testing/Procedures: Your physician has requested that you have a lexiscan myoview. For further information please visit [email or web address]. Please follow instruction sheet, as given.  Follow-Up: Your physician recommends that you schedule a follow-up appointment in: 2 months  Any Other Special Instructions Will Be Listed Below (If Applicable).  If you need a refill on your cardiac medications before your next appointment, please call your pharmacy.

## 2021-10-20 NOTE — Telephone Encounter (Signed)
Checking percert on the following patient for testing scheduled at [facility].     LEXISCAN  10/27/2021

## 2021-10-27 ENCOUNTER — Other Ambulatory Visit: Payer: Self-pay

## 2021-10-27 ENCOUNTER — Ambulatory Visit (HOSPITAL_COMMUNITY)
Admission: RE | Admit: 2021-10-27 | Discharge: 2021-10-27 | Disposition: A | Discharge status: Home / Self Care | Payer: Medicare Other | Source: Ambulatory Visit | Attending: Family Medicine | Admitting: Family Medicine

## 2021-10-27 ENCOUNTER — Encounter (HOSPITAL_COMMUNITY)
Admission: RE | Admit: 2021-10-27 | Discharge: 2021-10-27 | Disposition: A | Discharge status: Home / Self Care | Payer: Medicare Other | Source: Ambulatory Visit | Attending: Family Medicine | Admitting: Family Medicine

## 2021-10-27 DIAGNOSIS — R0602 Shortness of breath: Secondary | ICD-10-CM | POA: Diagnosis not present

## 2021-10-27 DIAGNOSIS — R079 Chest pain, unspecified: Secondary | ICD-10-CM | POA: Diagnosis not present

## 2021-10-27 LAB — NM MYOCAR MULTI W/SPECT W/WALL MOTION / EF
LV dias vol: 87 mL (ref 46–106)
LV sys vol: 33 mL
Nuc Stress EF: 62 %
Peak HR: 90 {beats}/min
RATE: 0.4
Rest HR: 63 {beats}/min
Rest Nuclear Isotope Dose: 8 mCi
SDS: 1
SRS: 1
SSS: 2
ST Depression (mm): 0 mm
Stress Nuclear Isotope Dose: 26.2 mCi
TID: 1.87

## 2021-10-27 MED ORDER — REGADENOSON 0.4 MG/5ML IV SOLN
INTRAVENOUS | Status: AC
  Administered 2021-10-27: 0.4 mg via INTRAVENOUS
  Filled 2021-10-27: qty 5

## 2021-10-27 MED ORDER — TECHNETIUM TC 99M TETROFOSMIN IV KIT
30.0000 | PACK | Freq: Once | INTRAVENOUS | Status: AC | PRN
  Administered 2021-10-27: 26 via INTRAVENOUS

## 2021-10-27 MED ORDER — SODIUM CHLORIDE FLUSH 0.9 % IV SOLN
INTRAVENOUS | Status: AC
  Administered 2021-10-27: 10 mL via INTRAVENOUS
  Filled 2021-10-27: qty 10

## 2021-10-27 MED ORDER — TECHNETIUM TC 99M TETROFOSMIN IV KIT
10.0000 | PACK | Freq: Once | INTRAVENOUS | Status: AC | PRN
  Administered 2021-10-27: 8 via INTRAVENOUS

## 2021-10-28 ENCOUNTER — Telehealth: Payer: Self-pay | Admitting: *Deleted

## 2021-10-28 NOTE — Telephone Encounter (Signed)
Patient informed. Copy sent to PCP °

## 2021-10-28 NOTE — Telephone Encounter (Signed)
-----   Message from Merlene Laughter, RN sent at 10/28/2021  7:18 AM EST -----  ----- Message ----- From: Verta Ellen., NP Sent: 10/27/2021   9:33 PM EST To: Laurine Blazer, LPN  Please call patient let her know the stress test was determined to be low risk by the physician interpreting the test.  Verta Ellen, NP  10/27/2021 9:32 PM

## 2021-10-28 NOTE — Telephone Encounter (Signed)
Patient is returning call to discuss stress test results. 

## 2021-11-03 DIAGNOSIS — E1165 Type 2 diabetes mellitus with hyperglycemia: Secondary | ICD-10-CM | POA: Diagnosis not present

## 2021-11-03 DIAGNOSIS — Z299 Encounter for prophylactic measures, unspecified: Secondary | ICD-10-CM | POA: Diagnosis not present

## 2021-11-03 DIAGNOSIS — R0602 Shortness of breath: Secondary | ICD-10-CM | POA: Diagnosis not present

## 2021-11-03 DIAGNOSIS — I10 Essential (primary) hypertension: Secondary | ICD-10-CM | POA: Diagnosis not present

## 2021-11-04 ENCOUNTER — Other Ambulatory Visit: Payer: Self-pay | Admitting: Cardiology

## 2021-11-04 DIAGNOSIS — Z23 Encounter for immunization: Secondary | ICD-10-CM | POA: Diagnosis not present

## 2021-11-16 DIAGNOSIS — J069 Acute upper respiratory infection, unspecified: Secondary | ICD-10-CM | POA: Diagnosis not present

## 2021-11-16 DIAGNOSIS — I10 Essential (primary) hypertension: Secondary | ICD-10-CM | POA: Diagnosis not present

## 2021-11-16 DIAGNOSIS — Z6841 Body Mass Index (BMI) 40.0 and over, adult: Secondary | ICD-10-CM | POA: Diagnosis not present

## 2021-11-16 DIAGNOSIS — N907 Vulvar cyst: Secondary | ICD-10-CM | POA: Diagnosis not present

## 2021-11-16 DIAGNOSIS — Z299 Encounter for prophylactic measures, unspecified: Secondary | ICD-10-CM | POA: Diagnosis not present

## 2021-11-23 DIAGNOSIS — J069 Acute upper respiratory infection, unspecified: Secondary | ICD-10-CM | POA: Diagnosis not present

## 2021-11-23 DIAGNOSIS — R5383 Other fatigue: Secondary | ICD-10-CM | POA: Diagnosis not present

## 2021-11-23 DIAGNOSIS — Z299 Encounter for prophylactic measures, unspecified: Secondary | ICD-10-CM | POA: Diagnosis not present

## 2021-11-23 DIAGNOSIS — M7062 Trochanteric bursitis, left hip: Secondary | ICD-10-CM | POA: Diagnosis not present

## 2021-11-23 DIAGNOSIS — Z6841 Body Mass Index (BMI) 40.0 and over, adult: Secondary | ICD-10-CM | POA: Diagnosis not present

## 2022-01-04 ENCOUNTER — Encounter: Payer: Self-pay | Admitting: *Deleted

## 2022-01-04 ENCOUNTER — Encounter: Payer: Self-pay | Admitting: Cardiology

## 2022-01-04 ENCOUNTER — Ambulatory Visit (INDEPENDENT_AMBULATORY_CARE_PROVIDER_SITE_OTHER): Payer: Medicare Other | Admitting: Cardiology

## 2022-01-04 VITALS — BP 124/60 | HR 74 | Ht 65.5 in | Wt 260.0 lb

## 2022-01-04 DIAGNOSIS — I5032 Chronic diastolic (congestive) heart failure: Secondary | ICD-10-CM

## 2022-01-04 DIAGNOSIS — I2510 Atherosclerotic heart disease of native coronary artery without angina pectoris: Secondary | ICD-10-CM | POA: Diagnosis not present

## 2022-01-04 NOTE — Patient Instructions (Addendum)

## 2022-01-04 NOTE — Progress Notes (Signed)
Clinical Summary Pamela Soto is a 71 y.o.female seen today for follow up of the following medical problems.      1. NICM/Chronic systolic HF/NSTEMI - admit Jan 2018 with CHF - echo at that time showed LVEF 35-40%, a new finding for the patient. - referred for cath. Feb 2018 cath: distal LAD 100%, Prox RCA 40%, OM3 40%. RHC mean PA 37, PCWP 26, CI 1.73. CAD medically managed.  - repeat echo 07/2017: LVEF 50-55%, abnormal diastolic function    - chronic SOB unchanged. No LE edema. Home weight is stable - taking lasix 10mg  daily just prn, has not used. Weights 268 -->262-->260 - labs 07/2021 mild elevation in Cr, BNP 12 10/2021 nuclear stress no ischemia.   2.SOB - seen by Dr Percival Spanish 05/2021 in clinic for SOB - CXR no acute process - had not been taking her lasix at the time, was to take more regularly.    From last pulm note:  "No worse off trelegy though she clearly has an asthmatic component so advised to restart immediately for any flare as her main problem is restrictive, not obstructive, but may  be prone to exac and needs to keep saba handy in case needs it for rescue".  10/2021 nuclear stress no ischemia.      3. COPD - 03/2017 PFTs: severe COPD - followed by Dr Melvyn Novas       4. Afib - no significant palpitations - no bleeding on eliquis.        5. Hyperlipidemia - reports nonspecific neck and shoulder pains, unclear if related to statin.  - somewhat improved with lower statin dosing - 07/2017 TC 131 TG 108 HDL 48 LDL 61 - atorvastatin recently increased to 40mg  daily. Since that time has had some stomach upset for few week, constiptation alternating with diarrhea.   - tolerating atorva 40mg  daily - reports most recent labs done by pcp     6. DM2 - followed by pcp     7. OSA - sleep study 03/2017 with severe OSA. On bipap     Past Medical History:  Diagnosis Date   Acid reflux    Hypertension    Sleep apnea      No Known Allergies   Current  Outpatient Medications  Medication Sig Dispense Refill   atorvastatin (LIPITOR) 40 MG tablet TAKE ONE TABLET BY MOUTH DAILY 90 tablet 3   bisoprolol (ZEBETA) 5 MG tablet TAKE ONE TABLET BY MOUTH DAILY. 90 tablet 1   ELIQUIS 5 MG TABS tablet TAKE ONE TABLET BY MOUTH TWICE DAILY. 60 tablet 6   Fluticasone-Umeclidin-Vilant (TRELEGY ELLIPTA) 100-62.5-25 MCG/INH AEPB Inhale 1 puff into the lungs daily. 60 each 0   furosemide (LASIX) 20 MG tablet Take 0.5 tablets (10 mg total) by mouth daily. 15 tablet 6   hydrALAZINE (APRESOLINE) 25 MG tablet Take 1 tablet (25 mg total) by mouth 2 (two) times daily. 60 tablet 3   potassium chloride SA (KLOR-CON) 20 MEQ tablet TAKE ONE TABLET BY MOUTH DAILY 90 tablet 3   telmisartan (MICARDIS) 40 MG tablet TAKE ONE TABLET BY MOUTH ONCE DAILY 30 tablet 11   No current facility-administered medications for this visit.     Past Surgical History:  Procedure Laterality Date   BIOPSY  09/13/2017   Procedure: BIOPSY;  Surgeon: Danie Binder, MD;  [facility]: [facility];  Service: Endoscopy;;  colon duodenum gastric   BREAST BIOPSY Right    normal result   CESAREAN  SECTION     CHOLECYSTECTOMY  11/06/2012   Procedure: LAPAROSCOPIC CHOLECYSTECTOMY WITH INTRAOPERATIVE CHOLANGIOGRAM;  Surgeon: Pedro Earls, MD;  Location: [facility];  Service: General;  Laterality: N/A;   COLONOSCOPY N/A 09/13/2017   Procedure: COLONOSCOPY;  Surgeon: Danie Binder, MD;  [facility];  Service: Endoscopy;  Laterality: N/A;  245   ESOPHAGOGASTRODUODENOSCOPY N/A 09/13/2017   Procedure: ESOPHAGOGASTRODUODENOSCOPY (EGD);  Surgeon: Danie Binder, MD;  [facility]: [facility];  Service: Endoscopy;  Laterality: N/A;   POLYPECTOMY  09/13/2017   Procedure: POLYPECTOMY;  Surgeon: Danie Binder, MD;  [facility];  Service: Endoscopy;;  colon   RIGHT/LEFT HEART CATH AND CORONARY ANGIOGRAPHY N/A 01/10/2017   Procedure: Right/Left Heart Cath and Coronary Angiography;   Surgeon: Belva Crome, MD;  Location: [place] [facility];  Service: Cardiovascular;  Laterality: N/A;   UMBILICAL HERNIA REPAIR  11/06/2012   Procedure: HERNIA REPAIR UMBILICAL ADULT;  Surgeon: Pedro Earls, MD;  Location: [facility];  Service: General;;     No Known Allergies    Family History  Problem Relation Age of Onset   Heart disease Mother 53   Heart attack Mother    Asthma Father    Cancer Father        pancreastic cancer   Breast cancer Sister 65   Cancer Sister        unsure   Heart attack Brother 13   Colon cancer Neg Hx      Social History Ms. Schuman reports that she has never smoked. She has never used smokeless tobacco. Ms. Raimer reports no history of alcohol use.   Review of Systems CONSTITUTIONAL: No weight loss, fever, chills, weakness or fatigue.  HEENT: Eyes: No visual loss, blurred vision, double vision or yellow sclerae.No hearing loss, sneezing, congestion, runny nose or sore throat.  SKIN: No rash or itching.  CARDIOVASCULAR: per hpi RESPIRATORY: No shortness of breath, cough or sputum.  GASTROINTESTINAL: No anorexia, nausea, vomiting or diarrhea. No abdominal pain or blood.  GENITOURINARY: No burning on urination, no polyuria NEUROLOGICAL: No headache, dizziness, syncope, paralysis, ataxia, numbness or tingling in the extremities. No change in bowel or bladder control.  MUSCULOSKELETAL: No muscle, back pain, joint pain or stiffness.  LYMPHATICS: No enlarged nodes. No history of splenectomy.  PSYCHIATRIC: No history of depression or anxiety.  ENDOCRINOLOGIC: No reports of sweating, cold or heat intolerance. No polyuria or polydipsia.  [name]   Physical Examination Today's Vitals   01/04/22 1349  BP: 124/60  Pulse: 74  SpO2: 92%  Weight: 260 lb (117.9 kg)  Height: 5' 5.5" (1.664 m)   Body mass index is 42.61 kg/m.  Gen: resting comfortably, no acute distress HEENT: no scleral icterus, pupils equal round and reactive, no palptable cervical  adenopathy,  CV:RRR, no mr/g no jvd Resp: Clear to auscultation bilaterally GI: abdomen is soft, non-tender, non-distended, normal bowel sounds, no hepatosplenomegaly MSK: extremities are warm, no edema.  Skin: warm, no rash Neuro:  no focal deficits Psych: appropriate affect   Diagnostic Studies 01/2017 cath Dist LAD lesion, 100 %stenosed. Prox RCA lesion, 40 %stenosed. 3rd Mrg lesion, 40 %stenosed. There is severe left ventricular systolic dysfunction. The left ventricular ejection fraction is less than 25% by visual estimate. LV end diastolic pressure is severely elevated. Hemodynamic findings consistent with moderate pulmonary hypertension.   Acute atrial fibrillation developed on the cath table during right heart/right atrial pressure recording. Rate 118 bpm. Atrial fibrillation was treated  with one bolus of IV amiodarone. 40% proximal RCA stenosis. Tortuous but normal circumflex 100% distal/apical LAD stenosis. This lesion is probably chronic but difficult to determine age. There is also a possibility that it is acute embolic occlusion from a catheter-based thrombus. For that reason additional heparin and then Aggrastat bolus was given as the patient complained of chest pressure post procedure. Chest pressure post procedure is similar to prior episodes. Severe LV dysfunction with EF less than 25%. Contraction pattern appears global. Arterial blood gas demonstrated severe CO2 retention, pCO2 67.       01/2017 echo Study Conclusions   - Left ventricle: The cavity size was mildly dilated. Wall   thickness was increased in a pattern of moderate LVH. Systolic   function was moderately reduced. The estimated ejection fraction   was in the range of 35% to 40%. Diffuse hypokinesis. - Aortic valve: Mildly calcified annulus. Trileaflet; mildly   thickened leaflets. - Mitral valve: There was mild regurgitation. - Left atrium: The atrium was moderately dilated. - Atrial septum: The  septum bowed from left to right, consistent   with increased left atrial pressure. - Pulmonic valve: There was mild regurgitation.     07/2017 echo Study Conclusions   - Left ventricle: The cavity size was normal. Wall thickness was   increased increased in a pattern of mild to moderate LVH.   Systolic function was normal. The estimated ejection fraction was   in the range of 50% to 55%. Diastolic function is abnormal,   indeterminant grade. LVOT VTI and Vmax likely underestimated   based on angle of acquisition. - Aortic valve: Valve area (VTI): 1.56 cm^2. Valve area (Vmax):   1.84 cm^2. - Technically difficult study.    Assessment and Plan  1. CAD/Chronic diastolic HF - recent cardiac testing overall benign, does have some chronic diastolic dysfunction - volume status appears normal, a recent BNP was actually 12 and had mild trend up in her Cr. Taking lasix just prn currently.  - nothing cardiac would appear to be causing her SOB, likely due to deconditioning and obesity given extensive cardaic and pulmonary testing. Encouragted more regular exercise and weight loss.   2. HTN - at goal, continue current meds  3. Hyperlipidemia - request labs from pcp, continue current meds      Arnoldo Lenis, M.D.

## 2022-02-02 DIAGNOSIS — Z299 Encounter for prophylactic measures, unspecified: Secondary | ICD-10-CM | POA: Diagnosis not present

## 2022-02-02 DIAGNOSIS — I4891 Unspecified atrial fibrillation: Secondary | ICD-10-CM | POA: Diagnosis not present

## 2022-02-02 DIAGNOSIS — I10 Essential (primary) hypertension: Secondary | ICD-10-CM | POA: Diagnosis not present

## 2022-02-02 DIAGNOSIS — J45909 Unspecified asthma, uncomplicated: Secondary | ICD-10-CM | POA: Diagnosis not present

## 2022-02-02 DIAGNOSIS — I5023 Acute on chronic systolic (congestive) heart failure: Secondary | ICD-10-CM | POA: Diagnosis not present

## 2022-02-04 DIAGNOSIS — R0602 Shortness of breath: Secondary | ICD-10-CM | POA: Diagnosis not present

## 2022-02-04 DIAGNOSIS — R059 Cough, unspecified: Secondary | ICD-10-CM | POA: Diagnosis not present

## 2022-02-05 DIAGNOSIS — Z20822 Contact with and (suspected) exposure to covid-19: Secondary | ICD-10-CM | POA: Diagnosis not present

## 2022-02-08 DIAGNOSIS — R0602 Shortness of breath: Secondary | ICD-10-CM | POA: Diagnosis not present

## 2022-02-09 DIAGNOSIS — I5023 Acute on chronic systolic (congestive) heart failure: Secondary | ICD-10-CM | POA: Diagnosis not present

## 2022-02-09 DIAGNOSIS — Z789 Other specified health status: Secondary | ICD-10-CM | POA: Diagnosis not present

## 2022-02-09 DIAGNOSIS — E1165 Type 2 diabetes mellitus with hyperglycemia: Secondary | ICD-10-CM | POA: Diagnosis not present

## 2022-02-09 DIAGNOSIS — Z299 Encounter for prophylactic measures, unspecified: Secondary | ICD-10-CM | POA: Diagnosis not present

## 2022-02-09 DIAGNOSIS — I10 Essential (primary) hypertension: Secondary | ICD-10-CM | POA: Diagnosis not present

## 2022-02-11 ENCOUNTER — Other Ambulatory Visit: Payer: Self-pay | Admitting: Cardiology

## 2022-02-11 DIAGNOSIS — I480 Paroxysmal atrial fibrillation: Secondary | ICD-10-CM

## 2022-02-12 NOTE — Telephone Encounter (Signed)
Prescription refill request for Eliquis received. ?Indication: Atrial fib ?Last office visit: 01/04/22  Zandra Abts MD ?Scr: 1.08 on 07/31/21 ?Age: 71 ?Weight: 117.9kg ? ?Based on above findings Eliquis '5mg'$  twice daily is the appropriate dose.  Refill approved. ? ?

## 2022-03-02 DIAGNOSIS — Z299 Encounter for prophylactic measures, unspecified: Secondary | ICD-10-CM | POA: Diagnosis not present

## 2022-03-02 DIAGNOSIS — E1165 Type 2 diabetes mellitus with hyperglycemia: Secondary | ICD-10-CM | POA: Diagnosis not present

## 2022-03-02 DIAGNOSIS — I5032 Chronic diastolic (congestive) heart failure: Secondary | ICD-10-CM | POA: Diagnosis not present

## 2022-03-02 DIAGNOSIS — R0600 Dyspnea, unspecified: Secondary | ICD-10-CM | POA: Diagnosis not present

## 2022-03-02 DIAGNOSIS — I10 Essential (primary) hypertension: Secondary | ICD-10-CM | POA: Diagnosis not present

## 2022-03-08 ENCOUNTER — Other Ambulatory Visit: Payer: Self-pay | Admitting: Cardiology

## 2022-03-12 DIAGNOSIS — Z20822 Contact with and (suspected) exposure to covid-19: Secondary | ICD-10-CM | POA: Diagnosis not present

## 2022-04-10 DIAGNOSIS — Z20822 Contact with and (suspected) exposure to covid-19: Secondary | ICD-10-CM | POA: Diagnosis not present

## 2022-04-13 ENCOUNTER — Other Ambulatory Visit: Payer: Self-pay | Admitting: *Deleted

## 2022-04-13 MED ORDER — TELMISARTAN 40 MG PO TABS: 40.0000 mg | ORAL_TABLET | Freq: Every day | ORAL | 2 refills | Status: DC

## 2022-04-22 DIAGNOSIS — M5440 Lumbago with sciatica, unspecified side: Secondary | ICD-10-CM | POA: Diagnosis not present

## 2022-04-22 DIAGNOSIS — I10 Essential (primary) hypertension: Secondary | ICD-10-CM | POA: Diagnosis not present

## 2022-04-22 DIAGNOSIS — Z299 Encounter for prophylactic measures, unspecified: Secondary | ICD-10-CM | POA: Diagnosis not present

## 2022-04-22 DIAGNOSIS — Z789 Other specified health status: Secondary | ICD-10-CM | POA: Diagnosis not present

## 2022-04-29 ENCOUNTER — Encounter: Payer: Self-pay | Admitting: Orthopedic Surgery

## 2022-04-29 ENCOUNTER — Ambulatory Visit (INDEPENDENT_AMBULATORY_CARE_PROVIDER_SITE_OTHER): Payer: Medicare Other | Admitting: Orthopedic Surgery

## 2022-04-29 ENCOUNTER — Ambulatory Visit (INDEPENDENT_AMBULATORY_CARE_PROVIDER_SITE_OTHER): Payer: Medicare Other

## 2022-04-29 VITALS — BP 181/90 | HR 65 | Ht 65.5 in | Wt 258.4 lb

## 2022-04-29 DIAGNOSIS — M47816 Spondylosis without myelopathy or radiculopathy, lumbar region: Secondary | ICD-10-CM | POA: Diagnosis not present

## 2022-04-29 DIAGNOSIS — M545 Low back pain, unspecified: Secondary | ICD-10-CM | POA: Diagnosis not present

## 2022-04-29 DIAGNOSIS — I2510 Atherosclerotic heart disease of native coronary artery without angina pectoris: Secondary | ICD-10-CM | POA: Diagnosis not present

## 2022-04-29 DIAGNOSIS — G8929 Other chronic pain: Secondary | ICD-10-CM

## 2022-04-29 DIAGNOSIS — M419 Scoliosis, unspecified: Secondary | ICD-10-CM

## 2022-04-29 DIAGNOSIS — M4317 Spondylolisthesis, lumbosacral region: Secondary | ICD-10-CM | POA: Diagnosis not present

## 2022-04-29 NOTE — Progress Notes (Signed)
NEW PROBLEM//OFFICE VISIT   Chief Complaint  Patient presents with   Back Pain    LBP radiating down rt leg Painful ~ 1 mth   This is a 71 year old female diabetic coronary artery disease presents with a 20-year history of back pain worse over the last month and radiates from.  Physical therapy    ROS: She is diabetic she has some numbness and tingling in her feet she has some occasional shortness of ROS   BP (!) 181/90   Pulse 65   Ht 5' 5.5" (1.664 m)   Wt 258 lb 6.4 oz (117.2 kg)   BMI 42.35 kg/m   Body mass index is 42.35 kg/m.  The patient meets the AMA guidelines for Morbid (severe) obesity with a BMI > 40.0 and I have recommended weight loss.   General appearance: Well-developed well-nourished no gross deformities  Cardiovascular normal pulse and perfusion normal color without edema  Neurologically no sensation loss or deficits or pathologic reflexes  Psychological: Awake alert and oriented x3 mood and affect normal  Skin no lacerations or ulcerations no nodularity no palpable masses, no erythema or nodularity  Musculoskeletal: The hip exam was completely normal; full rom both legs   Tenderness lower back right side         Past Medical History:  Diagnosis Date   Acid reflux    Hypertension    Sleep apnea     Past Surgical History:  Procedure Laterality Date   BIOPSY  09/13/2017   Procedure: BIOPSY;  Surgeon: Danie Binder, MD;  [facility];  Service: Endoscopy;;  colon duodenum gastric   BREAST BIOPSY Right    normal result   CESAREAN SECTION     CHOLECYSTECTOMY  11/06/2012   Procedure: LAPAROSCOPIC CHOLECYSTECTOMY WITH INTRAOPERATIVE CHOLANGIOGRAM;  Surgeon: Pedro Earls, MD;  Location: [facility];  Service: General;  Laterality: N/A;   COLONOSCOPY N/A 09/13/2017   Procedure: COLONOSCOPY;  Surgeon: Danie Binder, MD;  [facility];  Service: Endoscopy;  Laterality: N/A;  245   ESOPHAGOGASTRODUODENOSCOPY N/A  09/13/2017   Procedure: ESOPHAGOGASTRODUODENOSCOPY (EGD);  Surgeon: Danie Binder, MD;  [facility];  Service: Endoscopy;  Laterality: N/A;   POLYPECTOMY  09/13/2017   Procedure: POLYPECTOMY;  Surgeon: Danie Binder, MD;  [facility];  Service: Endoscopy;;  colon   RIGHT/LEFT HEART CATH AND CORONARY ANGIOGRAPHY N/A 01/10/2017   Procedure: Right/Left Heart Cath and Coronary Angiography;  Surgeon: Belva Crome, MD;  Location: [place] [facility];  Service: Cardiovascular;  Laterality: N/A;   UMBILICAL HERNIA REPAIR  11/06/2012   Procedure: HERNIA REPAIR UMBILICAL ADULT;  Surgeon: Pedro Earls, MD;  Location: [facility];  Service: General;;    Family History  Problem Relation Age of Onset   Heart disease Mother 61   Heart attack Mother    Asthma Father    Cancer Father        pancreastic cancer   Breast cancer Sister 14   Cancer Sister        unsure   Heart attack Brother 64   Colon cancer Neg Hx    Social History   Tobacco Use   Smoking status: Never   Smokeless tobacco: Never  Vaping Use   Vaping Use: Never used  Substance Use Topics   Alcohol use: No   Drug use: No    No Known Allergies  Current Meds  Medication Sig   apixaban (ELIQUIS) 5 MG  TABS tablet TAKE ONE TABLET BY MOUTH TWICE DAILY.   atorvastatin (LIPITOR) 40 MG tablet TAKE ONE TABLET BY MOUTH DAILY   bisoprolol (ZEBETA) 5 MG tablet TAKE ONE TABLET BY MOUTH DAILY.   cefUROXime (CEFTIN) 500 MG tablet Take 500 mg by mouth 2 (two) times daily.   Fluticasone-Umeclidin-Vilant (TRELEGY ELLIPTA) 100-62.5-25 MCG/INH AEPB Inhale 1 puff into the lungs daily.   furosemide (LASIX) 20 MG tablet Take 0.5 tablets (10 mg total) by mouth daily.   glimepiride (AMARYL) 2 MG tablet Take 2 mg by mouth daily.   hydrALAZINE (APRESOLINE) 25 MG tablet Take 1 tablet (25 mg total) by mouth 2 (two) times daily.   levofloxacin (LEVAQUIN) 500 MG tablet Take 500 mg by mouth daily.   potassium chloride SA (KLOR-CON) 20  MEQ tablet TAKE ONE TABLET BY MOUTH DAILY   telmisartan (MICARDIS) 40 MG tablet Take 1 tablet (40 mg total) by mouth daily.     MEDICAL DECISION MAKING  A.  Encounter Diagnoses  Name Primary?   Lumbar pain Yes   Chronic right-sided low back pain without sciatica    Spondylolisthesis, lumbosacral region    Lumbar spondylosis    Scoliosis of thoracic spine, unspecified scoliosis type     B. DATA ANALYSED:   IMAGING: Interpretation of images: I have personally reviewed the images and my interpretation is scoliosis spondylosis and gr I L3-4 spondylolisthesis  Orders: PT  Outside records reviewed: no   C. MANAGEMENT   Rec physical therapy   I do not see any surgical indications; rec PT   F/u prn   No orders of the defined types were placed in this encounter.    Arther Abbott, MD  04/29/2022 10:54 AM

## 2022-05-08 DIAGNOSIS — J441 Chronic obstructive pulmonary disease with (acute) exacerbation: Secondary | ICD-10-CM | POA: Diagnosis not present

## 2022-05-08 DIAGNOSIS — R0600 Dyspnea, unspecified: Secondary | ICD-10-CM | POA: Diagnosis not present

## 2022-05-08 DIAGNOSIS — Z20822 Contact with and (suspected) exposure to covid-19: Secondary | ICD-10-CM | POA: Diagnosis not present

## 2022-05-08 DIAGNOSIS — G4733 Obstructive sleep apnea (adult) (pediatric): Secondary | ICD-10-CM | POA: Diagnosis not present

## 2022-05-08 DIAGNOSIS — J9622 Acute and chronic respiratory failure with hypercapnia: Secondary | ICD-10-CM | POA: Diagnosis not present

## 2022-05-08 DIAGNOSIS — I10 Essential (primary) hypertension: Secondary | ICD-10-CM | POA: Diagnosis not present

## 2022-05-08 DIAGNOSIS — I429 Cardiomyopathy, unspecified: Secondary | ICD-10-CM | POA: Diagnosis not present

## 2022-05-08 DIAGNOSIS — Z7984 Long term (current) use of oral hypoglycemic drugs: Secondary | ICD-10-CM | POA: Diagnosis not present

## 2022-05-08 DIAGNOSIS — J9602 Acute respiratory failure with hypercapnia: Secondary | ICD-10-CM | POA: Diagnosis not present

## 2022-05-08 DIAGNOSIS — Z9989 Dependence on other enabling machines and devices: Secondary | ICD-10-CM | POA: Diagnosis not present

## 2022-05-08 DIAGNOSIS — J9601 Acute respiratory failure with hypoxia: Secondary | ICD-10-CM | POA: Diagnosis not present

## 2022-05-08 DIAGNOSIS — Z79899 Other long term (current) drug therapy: Secondary | ICD-10-CM | POA: Diagnosis not present

## 2022-05-08 DIAGNOSIS — J9621 Acute and chronic respiratory failure with hypoxia: Secondary | ICD-10-CM | POA: Diagnosis not present

## 2022-05-08 DIAGNOSIS — E119 Type 2 diabetes mellitus without complications: Secondary | ICD-10-CM | POA: Diagnosis not present

## 2022-05-08 DIAGNOSIS — I2510 Atherosclerotic heart disease of native coronary artery without angina pectoris: Secondary | ICD-10-CM | POA: Diagnosis not present

## 2022-05-09 DIAGNOSIS — I2510 Atherosclerotic heart disease of native coronary artery without angina pectoris: Secondary | ICD-10-CM | POA: Diagnosis not present

## 2022-05-09 DIAGNOSIS — Z9989 Dependence on other enabling machines and devices: Secondary | ICD-10-CM | POA: Diagnosis not present

## 2022-05-09 DIAGNOSIS — J9621 Acute and chronic respiratory failure with hypoxia: Secondary | ICD-10-CM | POA: Diagnosis not present

## 2022-05-09 DIAGNOSIS — Z79899 Other long term (current) drug therapy: Secondary | ICD-10-CM | POA: Diagnosis not present

## 2022-05-09 DIAGNOSIS — I429 Cardiomyopathy, unspecified: Secondary | ICD-10-CM | POA: Diagnosis not present

## 2022-05-09 DIAGNOSIS — G4733 Obstructive sleep apnea (adult) (pediatric): Secondary | ICD-10-CM | POA: Diagnosis not present

## 2022-05-09 DIAGNOSIS — Z7901 Long term (current) use of anticoagulants: Secondary | ICD-10-CM | POA: Diagnosis not present

## 2022-05-09 DIAGNOSIS — I10 Essential (primary) hypertension: Secondary | ICD-10-CM | POA: Diagnosis not present

## 2022-05-09 DIAGNOSIS — E119 Type 2 diabetes mellitus without complications: Secondary | ICD-10-CM | POA: Diagnosis not present

## 2022-05-09 DIAGNOSIS — J441 Chronic obstructive pulmonary disease with (acute) exacerbation: Secondary | ICD-10-CM | POA: Diagnosis not present

## 2022-05-09 DIAGNOSIS — J9622 Acute and chronic respiratory failure with hypercapnia: Secondary | ICD-10-CM | POA: Diagnosis not present

## 2022-05-09 DIAGNOSIS — Z7984 Long term (current) use of oral hypoglycemic drugs: Secondary | ICD-10-CM | POA: Diagnosis not present

## 2022-05-18 ENCOUNTER — Ambulatory Visit (HOSPITAL_COMMUNITY): Payer: Medicare Other | Admitting: Physical Therapy

## 2022-05-24 ENCOUNTER — Other Ambulatory Visit: Payer: Self-pay | Admitting: Cardiology

## 2022-06-05 DIAGNOSIS — J441 Chronic obstructive pulmonary disease with (acute) exacerbation: Secondary | ICD-10-CM | POA: Diagnosis not present

## 2022-06-05 DIAGNOSIS — I5022 Chronic systolic (congestive) heart failure: Secondary | ICD-10-CM | POA: Diagnosis not present

## 2022-06-17 DIAGNOSIS — I10 Essential (primary) hypertension: Secondary | ICD-10-CM | POA: Diagnosis not present

## 2022-06-17 DIAGNOSIS — Z299 Encounter for prophylactic measures, unspecified: Secondary | ICD-10-CM | POA: Diagnosis not present

## 2022-06-17 DIAGNOSIS — Z7189 Other specified counseling: Secondary | ICD-10-CM | POA: Diagnosis not present

## 2022-06-17 DIAGNOSIS — R5383 Other fatigue: Secondary | ICD-10-CM | POA: Diagnosis not present

## 2022-06-17 DIAGNOSIS — Z79899 Other long term (current) drug therapy: Secondary | ICD-10-CM | POA: Diagnosis not present

## 2022-06-17 DIAGNOSIS — E7800 Pure hypercholesterolemia, unspecified: Secondary | ICD-10-CM | POA: Diagnosis not present

## 2022-06-17 DIAGNOSIS — Z0000 Encounter for general adult medical examination without abnormal findings: Secondary | ICD-10-CM | POA: Diagnosis not present

## 2022-06-17 DIAGNOSIS — Z789 Other specified health status: Secondary | ICD-10-CM | POA: Diagnosis not present

## 2022-06-24 ENCOUNTER — Encounter: Payer: Self-pay | Admitting: Internal Medicine

## 2022-06-24 ENCOUNTER — Ambulatory Visit (INDEPENDENT_AMBULATORY_CARE_PROVIDER_SITE_OTHER): Payer: Medicare Other | Admitting: Internal Medicine

## 2022-06-24 DIAGNOSIS — J4530 Mild persistent asthma, uncomplicated: Secondary | ICD-10-CM

## 2022-06-24 DIAGNOSIS — Z9989 Dependence on other enabling machines and devices: Secondary | ICD-10-CM | POA: Diagnosis not present

## 2022-06-24 DIAGNOSIS — G4733 Obstructive sleep apnea (adult) (pediatric): Secondary | ICD-10-CM

## 2022-06-24 DIAGNOSIS — I10 Essential (primary) hypertension: Secondary | ICD-10-CM

## 2022-06-24 DIAGNOSIS — R0609 Other forms of dyspnea: Secondary | ICD-10-CM

## 2022-06-24 MED ORDER — BREZTRI AEROSPHERE 160-9-4.8 MCG/ACT IN AERO: INHALATION_SPRAY | RESPIRATORY_TRACT | 11 refills | Status: AC

## 2022-06-24 MED ORDER — BREZTRI AEROSPHERE 160-9-4.8 MCG/ACT IN AERO: 2.0000 | INHALATION_SPRAY | Freq: Two times a day (BID) | RESPIRATORY_TRACT | 0 refills | Status: DC

## 2022-06-24 NOTE — Assessment & Plan Note (Signed)
pfts restrictive with ERV 34%  03/2017   Body mass index is 43.53 kg/m.  -  trending down now reinforced  Lab Results  Component Value Date   TSH 2.186 07/13/2017    Contributing to doe and risk of GERD >>>   reviewed the need and the process to achieve and maintain neg calorie balance > defer f/u primary care including intermittently monitoring thyroid status     Def would benefit from rehab to improve cal balance  Each maintenance medication was reviewed in detail including emphasizing most importantly the difference between maintenance and prns and under what circumstances the prns are to be triggered using an action plan format where appropriate.  Total time for H and P, chart review, counseling, reviewing hfa device(s) , directly observing portions of ambulatory 02 saturation study/ and generating customized AVS unique to this office visit / same day charting  > 30 min for    refractory respiratory  symptoms of uncertain etiology

## 2022-06-24 NOTE — Assessment & Plan Note (Signed)
Try off lisinopril for sob/ throat clearing 09/11/2020 > apprently back on it 05/08/2022 admit to [facility]   In the best review of chronic cough to date ( NEJM 2016 375 [id number]) ,  ACEi are now felt to cause cough in up to  20% of pts which is a 4 fold increase from previous reports and does not include the variety of non-specific complaints we see in pulmonary clinic in pts on ACEi but previously attributed to another dx like  Copd/asthma and  include PNDS, throat and chest congestion, "bronchitis", unexplained dyspnea and noct "strangling" sensations, and hoarseness, but also  atypical /refractory GERD symptoms like dysphagia and "bad heartburn"   Rec: permanently d/c acei

## 2022-06-24 NOTE — Patient Instructions (Addendum)
Plan A = Automatic = Always=    Breztri Take 2 puffs first thing in am and then another 2 puffs about 12 hours later.    Work on inhaler technique:  relax and gently blow all the way out then take a nice smooth full deep breath back in, triggering the inhaler at same time you start breathing in.  Hold for up to 5 seconds if you can. Blow out thru nose. Rinse and gargle with water when done.  If mouth or throat bother you at all,  try brushing teeth/gums/tongue with arm and hammer toothpaste/ make a slurry and gargle and spit out.    - use an empty device   Plan B = Backup (to supplement plan A, not to replace it) Only use your albuterol inhaler as a rescue medication to be used if you can't catch your breath by resting or doing a relaxed purse lip breathing pattern.  - The less you use it, the better it will work when you need it. - Ok to use the inhaler up to 1-2 puffs  every 4 hours if you must but call for appointment if use goes up over your usual need - Don't leave home without it !!  (think of it like the spare tire for your car)      Resume your rehab   My office will be contacting you by phone for referral to sleep medicine

## 2022-06-24 NOTE — Progress Notes (Signed)
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 no problem thru childhood or childbirth with last child born around 1980 and back to 150 lb baseline  and doing great until 2000 admitted with Pneumonia to [facility] with dx of chf and since then downhill with mild asthma on pfts 2018 and restictive changes  with low ERV so referred to pulmonary clinic in [facility]  07/17/2020 by Pamela Beach NP     History of Present Illness  07/17/2020  Pulmonary/ 1st office eval/ Pamela Soto / Pamela Soto Office / trelegy  Chief Complaint  Patient presents with   Consult    shortness of breath with exertion  Dyspnea:  Started walking at  [employer] a few weeks ago p lost wt still struggle to do more than one aisle due to sob  Cough: none  Sleep: 2 pillows/ cpap and 02  SABA use: none now  02 hs ? Flow/ not using daytime at all rec Trelegy one click each am first thing and then blow out thru nose - stop it after a month if not convinced  Please schedule a follow up office visit in 6 - 8 weeks with PFTs and same day ov , call sooner if needed  - do not use the Trelegy that morning    09/11/2020  f/u ov/[place] office/Pamela Soto re:  Doe with minimal asthma / no change off trelegy  Chief Complaint  Patient presents with   Follow-up    PFT's done 09/09/20.  Pt c/o PND and prod cough with clear sputum. She has not used her albuterol neb.     Dyspnea:  Shopping at [employer] now  Cough: urge to clear throat/ sense of pnds s other evidence of obvious sinus dz on ACi  Sleeping: sleeps on side/ bed is flat and one pillow SABA use: has neb, not recently used it  02: hs  Rec Stop lisinopril and your drainage, throat tickle, cough should gradually improve over the next couple of weeks  micardis (ibesartan)  40 mg  one half each am  To get the most out of exercise, you need to be continuously aware that you are short of breath, but never out of breath, for 30  minutes daily. Pulmonary follow up is as needed      09/15/2021  f/u ov/Pamela Soto re: min asthma  maint on no maint rx and off acei   Chief Complaint  Patient presents with   Follow-up    SOB has worsened since last OV Oct 2021. Notices the SOB most after exertion   Dyspnea:  not shopping as much due to breathing, no longer doing mb x month x 50 ft flat  Cough: tickle in throat not as bad now, still occ has it off acei but not nearly as freq Sleeping: flat bed one pillow on side no noct resp cc SABA use: has trelegy but hasn't started using 02:2lpm hs cpap/02  per [facility] / not using it as much, doesn't think it's working well  Covid status:  covid vax x 3  Chest pain x sev months  of midline ant  below L cavicle  x 5-10 min every changes with shoulder position esp noct not worse with ex or breathing  Rec Trelegy one click each am  x at least a week to see if helps breathing  To get the most out of exercise, you need to be  continuously aware that you are short of breath, but never out of breath, for at least 30 minutes daily.  Make sure your 02 saturations stay above 90% at peak exercise   >>>Follow up with our sleep medicine doctors here - next available appt > not done as of 06/24/2022    Admit date: 05/08/2022 Discharge date: 05/13/2022  COPD exacerbation (CMS-HCC) Acute and chronic respiratory failure, unspecified whether with hypoxia or hypercapnia (CMS-HCC) Diabetes mellitus (CMS-HCC) History of CAD (coronary artery disease) OSA (obstructive sleep apnea) Pamela Soto is a 71 y.o. female that presented to [facility]  Patient was admitted on 05/08/2022 ON LISINOPRIL for COPD exacerbation. Patient uses nightly O2 however has needed to increase this to using during the day as well. She presented with increased cough, congestion, and shortness of breath with any physical activity. Patient required several days of IV corticosteroids, was treated initially with antibiotics due to leukocytosis  however patient remained afebrile and antibiotics discontinued. Patient required around-the-clock DuoNeb treatments, and inhaled steroids. Throughout her hospital stay patient's respiratory status slowly improved. At the date of discharge patient weaned down to 2 L of O2, case management arranging to have concentrator serviced, as patient states that it has been quite sometime. She denies any increased shortness of breath, cough, or congestion at the time of discharge. Patient is interested in discontinuing some of her medications, encouraged her to follow-up with PCP in order to do so. I did recommend patient stop her daily potassium supplement, she is no longer on Lasix and likely would receive enough potassium through her diet to maintain normal levels. Patient also reports that she no longer takes the hydralazine for her blood pressure, we did have patient on Diovan while hospitalized and blood pressure remained appropriate. Recommend that she follows up with cardiology and pulmonology as regularly scheduled.  Issues addressed during current hospitalization:  1. COPD exacerbation/Acute on chronic hypercapnic/hypoxic respiratory failure/OSA Patient has COPD, sees pulmonology, never a smoker, but did have asthma prior to COPD diagnosis. pH compensated for PCO2, suspect patient has component of obesity hypoventilation syndrome, also has diagnosis of OSA on CPAP at night Continue patient's home inhalers, continue BiPAP as needed and while asleep Continue supportive treatments, nebulizers, and corticosteroids We will plan for 5 days of oral prednisone, no indication for further antibiotic use, patient does have leukocytosis however likely secondary to corticosteroids, she has remained afebrile and respiratory status has continued to slowly improve Admits to not using her Trelegy inhaler daily. Counseled to be compliant with medications.  Recommend continue Trelegy inhaler daily with follow-up to  pulmonology and PCP to ensure continued improvement   2.  Diabetes mellitus type 2 On glimepiride and metformin outpatient These medications were held during admission however recommend resuming outpatient Continue further outpatient management  3.  Hypertension/history of CAD Continue home BP medications on telmisartan, will substitute with valsartan while hospitalized History of CAD with cardiomyopathy, sees Dr. Harl Bowie  EF fully recovered with medical therapy; on bisoprolol outpatient, substituted for metoprolol succinate during hospital stay.   On Eliquis twice daily presumably for history of A-fib although unable to find this on chart review. No longer requiring Lasix therapy, does not use this at home Recommend continued follow-up outpatient with cardiology as previously scheduled           START taking these medications  predniSONE 20 MG tablet Commonly known as: DELTASONE Take 2 tablets (40 mg total) by mouth daily for 5 days.   CHANGE how you take  these medications  fluticasone-umeclidin-vilanter 100-62.5-25 mcg inhaler Commonly known as: TRELEGY ELLIPTA Inhale 1 puff daily. What changed: Another medication with the same name was added. Make sure you understand how and when to take each.   06/24/2022  post hosp f/u  f/u ov/ office/Pamela Soto re: chronic mild  asthma maint on trelgy   Chief Complaint  Patient presents with   Follow-up    Was at [facility] for COPD exac.  Patient has had increased SOB.    Dyspnea:  flare happened on trelegy > admitted [place] no better / rides cart at grocery since admit / also limited by back/hip pain  Cough: none  Sleeping:   flat/ level/ on side / on cpap / 2lpm  SABA use: not using due to shake 02: never uses daytime  Covid status: vax x 2      No obvious day to day or daytime variability or assoc excess/ purulent sputum or mucus plugs or hemoptysis or cp or chest tightness, subjective wheeze or overt sinus or hb symptoms.    Sleeping  without nocturnal  or early am exacerbation  of respiratory  c/o's or need for noct saba. Also denies any obvious fluctuation of symptoms with weather or environmental changes or other aggravating or alleviating factors except as outlined above   No unusual exposure hx or h/o childhood pna/ asthma or knowledge of premature birth.  Current Allergies, Complete Past Medical History, Past Surgical History, Family History, and Social History were reviewed in [employer] record.  ROS  The following are not active complaints unless bolded Hoarseness, sore throat, dysphagia, dental problems, itching, sneezing,  nasal congestion or discharge of excess mucus or purulent secretions, ear ache,   fever, chills, sweats, unintended wt loss or wt gain, classically pleuritic or exertional cp,  orthopnea pnd or arm/hand swelling  or leg swelling, presyncope, palpitations, abdominal pain, anorexia, nausea, vomiting, diarrhea  or change in bowel habits or change in bladder habits, change in stools or change in urine, dysuria, hematuria,  rash, arthralgias, visual complaints, headache, numbness, weakness or ataxia or problems with walking or coordination,  change in mood or  memory.        Current Meds  Medication Sig   apixaban (ELIQUIS) 5 MG TABS tablet TAKE ONE TABLET BY MOUTH TWICE DAILY.   bisoprolol (ZEBETA) 5 MG tablet TAKE ONE TABLET BY MOUTH DAILY   Fluticasone-Umeclidin-Vilant (TRELEGY ELLIPTA) 100-62.5-25 MCG/INH AEPB Inhale 1 puff into the lungs daily.   furosemide (LASIX) 20 MG tablet Take 0.5 tablets (10 mg total) by mouth daily.   glimepiride (AMARYL) 2 MG tablet Take 2 mg by mouth daily.   hydrALAZINE (APRESOLINE) 25 MG tablet Take 1 tablet (25 mg total) by mouth 2 (two) times daily.   telmisartan (MICARDIS) 40 MG tablet Take 1 tablet (40 mg total) by mouth daily.                 Past Medical History:  Diagnosis Date   Acid reflux    Hypertension    Sleep  apnea       Objective:    Wts  06/24/2022       261  09/15/2021     268    09/11/20 246 lb (111.6 kg)  07/17/20 245 lb 6.4 oz (111.3 kg)  12/11/19 269 lb (122 kg)     Vital signs reviewed  06/24/2022  - Note at rest 02 sats  93% on RA   General appearance:  obese slow moving bf nad     HEENT : Oropharynx  clear      Nasal turbinates nl    NECK :  without  apparent JVD/ palpable Nodes/TM    LUNGS: no acc muscle use,  Nl contour chest with  distant  BS bilaterally without cough on insp or exp maneuvers   CV:  RRR  no s3 or murmur or increase in P2, and no edema   ABD:   tensely obese  nontender with nl inspiratory excursion in the supine position.    MS:  Nl gait/ ext warm without deformities Or obvious joint restrictions  calf tenderness, cyanosis or clubbing    SKIN: warm and dry without lesions    NEURO:  alert, approp, nl sensorium with  no motor or cerebellar deficits apparent.      I personally reviewed images and agree with radiology impression as follows:  CXR:   portable 05/08/22  Wnl    Assessment

## 2022-06-24 NOTE — Assessment & Plan Note (Addendum)
Never smoker - PFT's  03/09/17 @ wt 254   FEV1 1.32 (62 % ) ratio 0.76  p 18 % improvement from saba p albuterol prior to study with DLCO  17.75 (67%) corrects to 6.3 (127%)  for alv volume and FV curve min concavity and ERV 34%  - PFT's  09/09/2020 @ wt 246   FEV1 1.32 (65 % ) ratio 0.76  p 21 % improvement from saba p 16.47 prior to study with DLCO  16.47 (79%) corrects to 5.49 (131%)  for alv volume and FV curve mild concavity and ERV 46%   - 09/15/2021  After extensive coaching inhaler device,  effectiveness =    90% with dpi > try trelegy 659 one click each am and d/c p sample if no better doe  - 06/24/2022  After extensive coaching inhaler device,  effectiveness =    75% try breztri 2bid and resume rehab and leave off ACEi indefinitely   DDX of  difficult airways management almost all start with A and  include Adherence, Ace Inhibitors, Acid Reflux, Active Sinus Disease, Alpha 1 Antitripsin deficiency, Anxiety masquerading as Airways dz,  ABPA,  Allergy(esp in young), Aspiration (esp in elderly), Adverse effects of meds,  Active smoking or vaping, A bunch of PE's (a small clot burden can't cause this syndrome unless there is already severe underlying pulm or vascular dz with poor reserve) plus two Bs  = Bronchiectasis and Beta blocker use..and one C= CHF  Adherence is always the initial "prime suspect" and is a multilayered concern that requires a "trust but verify" approach in every patient - starting with knowing how to use medications, especially inhalers, correctly, keeping up with refills and understanding the fundamental difference between maintenance and prns vs those medications only taken for a very short course and then stopped and not refilled.  - see hfa teaching   ACEi adverse effects at the  top of the usual list of suspects and the only way to rule it out is a trial off.  Although even in retrospect it may not be clear the ACEi contributed to the pt's symptoms,  Pt improved off them and  adding them back at this point or in the future would risk confusion in interpretation of non-specific respiratory symptoms to which this patient is prone  ie  Better not to muddy the waters here.   ? Adverse effects of DPI > try off trelegy and on breztri 2bid trial with approp saba/ reviewed  ? allegy /asthma > for now change to breztri 2bid and approp saba   ? chf related to diastolic dysfunction from obestiy/ hbp > rx per cards but would avoid acei for the above concerns

## 2022-06-24 NOTE — Assessment & Plan Note (Signed)
Onset 2000 with pna/ chf/ wt gain - Echo 09/13/17  Mild / mod LVH with nl LA -  07/17/2020   Walked RA  approx   600 ft  @ moderte to fast pace  stopped due to  End of study though sob at end and sats 93%    - PFTs 03/09/17 restrictive with minimal asthma component and ERV 34%  - PFTs 09/09/20 unchanged though 21% better saba  - 09/11/2020  Change the lisinopril to  ibesartan 40 mg one half daily  - Echo 05/21/21 Mod  LVH with mild LAE  - 09/15/2021 worse doe assoc with another 22 lb wt gain over prior eval = 268 lb - 09/15/2021   Walked @ 268 lbs on RA x  3  lap(s) =  approx 450  @ fast pace, stopped due to end study but sob  with lowest 02 sats 91%  - 06/24/2022   Walked on RA  x  2  lap(s) =  approx 300  ft  @ mod fast pace, stopped due to sob/back pain with lowest 02 sats 93%      rec resume rehab/ needs to find ex that won't aggravate her back and pace better.

## 2022-06-24 NOTE — Assessment & Plan Note (Signed)
Use of CPAP per pulmonology Luan Pulling) - Sleep study done 04/2017 - referred [facility] pulmonary/sleep medicine 09/15/2021  And again 06/24/2022   contine cpap/ 02 as they have been pending sleep medicine eval esp since this may contribute to hbp/diastolic dysfunction

## 2022-07-05 ENCOUNTER — Encounter: Payer: Self-pay | Admitting: Cardiology

## 2022-07-05 ENCOUNTER — Ambulatory Visit (INDEPENDENT_AMBULATORY_CARE_PROVIDER_SITE_OTHER): Payer: Medicare Other | Admitting: Cardiology

## 2022-07-05 ENCOUNTER — Encounter: Payer: Self-pay | Admitting: *Deleted

## 2022-07-05 VITALS — BP 130/68 | HR 61 | Ht 65.0 in | Wt 262.8 lb

## 2022-07-05 DIAGNOSIS — I480 Paroxysmal atrial fibrillation: Secondary | ICD-10-CM

## 2022-07-05 DIAGNOSIS — I10 Essential (primary) hypertension: Secondary | ICD-10-CM | POA: Diagnosis not present

## 2022-07-05 DIAGNOSIS — I5032 Chronic diastolic (congestive) heart failure: Secondary | ICD-10-CM | POA: Diagnosis not present

## 2022-07-05 DIAGNOSIS — E785 Hyperlipidemia, unspecified: Secondary | ICD-10-CM | POA: Diagnosis not present

## 2022-07-05 NOTE — Progress Notes (Signed)
Clinical Summary Ms. Birden is a 71 y.o.female seen today for follow up of the following medical problems.      1. NICM/Chronic systolic HF/NSTEMI - admit Jan 2018 with CHF - echo at that time showed LVEF 35-40%, a new finding for the patient. - referred for cath. Feb 2018 cath: distal LAD 100%, Prox RCA 40%, OM3 40%. RHC mean PA 37, PCWP 26, CI 1.73. CAD medically managed.  - repeat echo 07/2017: LVEF 50-55%, abnormal diastolic function    - chronic SOB unchanged. No LE edema. Home weight is stable - taking lasix '10mg'$  daily just prn, has not used. Weights 268 -->262-->260 - labs 07/2021 mild elevation in Cr, BNP 12   05/2021 echo: LVEF 60-65%< no WMAs, indet diastolic 16/1096 nuclear stress no ischemia.  No recent edema. Has lasix takes prn but has not needed.    2.SOB - seen by Dr Percival Spanish 05/2021 in clinic for SOB - CXR no acute process - had not been taking her lasix at the time, was to take more regularly.    From last pulm note:  "No worse off trelegy though she clearly has an asthmatic component so advised to restart immediately for any flare as her main problem is restrictive, not obstructive, but may  be prone to exac and needs to keep saba handy in case needs it for rescue".   10/2021 nuclear stress no ischemia.         3. COPD - 03/2017 PFTs: severe COPD - followed by Dr Melvyn Novas   - recent admit 05/2022 with COPD exacerbation - has O2 at home to use as needed     4. Afib - no palpitations - no bleeding on eliquis       5. Hyperlipidemia - reports nonspecific neck and shoulder pains, unclear if related to statin.  - somewhat improved with lower statin dosing - 07/2017 TC 131 TG 108 HDL 48 LDL 61 - atorvastatin recently increased to '40mg'$  daily. Since that time has had some stomach upset for few week, constiptation alternating with diarrhea.   - tolerating atorva '40mg'$  daily - reports most recent labs done by pcp     6. DM2 - followed by pcp     7.  OSA - sleep study 03/2017 with severe OSA. On bipap   Past Medical History:  Diagnosis Date   Acid reflux    Hypertension    Sleep apnea      No Known Allergies   Current Outpatient Medications  Medication Sig Dispense Refill   apixaban (ELIQUIS) 5 MG TABS tablet TAKE ONE TABLET BY MOUTH TWICE DAILY. 60 tablet 5   bisoprolol (ZEBETA) 5 MG tablet TAKE ONE TABLET BY MOUTH DAILY 90 tablet 1   Budeson-Glycopyrrol-Formoterol (BREZTRI AEROSPHERE) 160-9-4.8 MCG/ACT AERO Take 2 puffs first thing in am and then another 2 puffs about 12 hours later. 10.7 g 11   furosemide (LASIX) 20 MG tablet Take 0.5 tablets (10 mg total) by mouth daily. 15 tablet 6   glimepiride (AMARYL) 2 MG tablet Take 2 mg by mouth daily.     hydrALAZINE (APRESOLINE) 25 MG tablet Take 1 tablet (25 mg total) by mouth 2 (two) times daily. 60 tablet 3   telmisartan (MICARDIS) 40 MG tablet Take 1 tablet (40 mg total) by mouth daily. 90 tablet 2   No current facility-administered medications for this visit.     Past Surgical History:  Procedure Laterality Date   BIOPSY  09/13/2017   Procedure:  BIOPSY;  Surgeon: Danie Binder, MD;  Location: [facility];  Service: Endoscopy;;  colon duodenum gastric   BREAST BIOPSY Right    normal result   CESAREAN SECTION     CHOLECYSTECTOMY  11/06/2012   Procedure: LAPAROSCOPIC CHOLECYSTECTOMY WITH INTRAOPERATIVE CHOLANGIOGRAM;  Surgeon: Pedro Earls, MD;  Location: [facility];  Service: General;  Laterality: N/A;   COLONOSCOPY N/A 09/13/2017   Procedure: COLONOSCOPY;  Surgeon: Danie Binder, MD;  Location: [facility];  Service: Endoscopy;  Laterality: N/A;  245   ESOPHAGOGASTRODUODENOSCOPY N/A 09/13/2017   Procedure: ESOPHAGOGASTRODUODENOSCOPY (EGD);  Surgeon: Danie Binder, MD;  Location: [facility];  Service: Endoscopy;  Laterality: N/A;   POLYPECTOMY  09/13/2017   Procedure: POLYPECTOMY;  Surgeon: Danie Binder, MD;  Location: [facility];  Service: Endoscopy;;   colon   RIGHT/LEFT HEART CATH AND CORONARY ANGIOGRAPHY N/A 01/10/2017   Procedure: Right/Left Heart Cath and Coronary Angiography;  Surgeon: Belva Crome, MD;  Location: [facility];  Service: Cardiovascular;  Laterality: N/A;   UMBILICAL HERNIA REPAIR  11/06/2012   Procedure: HERNIA REPAIR UMBILICAL ADULT;  Surgeon: Pedro Earls, MD;  Location: [facility];  Service: General;;     No Known Allergies    Family History  Problem Relation Age of Onset   Heart disease Mother 34   Heart attack Mother    Asthma Father    Cancer Father        pancreastic cancer   Breast cancer Sister 55   Cancer Sister        unsure   Heart attack Brother 66   Colon cancer Neg Hx      Social History Ms. Goodson reports that she has never smoked. She has never used smokeless tobacco. Ms. Hoffmaster reports no history of alcohol use.   Review of Systems CONSTITUTIONAL: No weight loss, fever, chills, weakness or fatigue.  HEENT: Eyes: No visual loss, blurred vision, double vision or yellow sclerae.No hearing loss, sneezing, congestion, runny nose or sore throat.  SKIN: No rash or itching.  CARDIOVASCULAR: per hpi RESPIRATORY: per hpi GASTROINTESTINAL: No anorexia, nausea, vomiting or diarrhea. No abdominal pain or blood.  GENITOURINARY: No burning on urination, no polyuria NEUROLOGICAL: No headache, dizziness, syncope, paralysis, ataxia, numbness or tingling in the extremities. No change in bowel or bladder control.  MUSCULOSKELETAL: No muscle, back pain, joint pain or stiffness.  LYMPHATICS: No enlarged nodes. No history of splenectomy.  PSYCHIATRIC: No history of depression or anxiety.  ENDOCRINOLOGIC: No reports of sweating, cold or heat intolerance. No polyuria or polydipsia.  [name]   Physical Examination Today's Vitals   07/05/22 1426  BP: 130/68  Pulse: 61  SpO2: 92%  Weight: 262 lb 12.8 oz (119.2 kg)  Height: '5\' 5"'$  (1.651 m)   Body mass index is 43.73 kg/m.  Gen: resting  comfortably, no acute distress HEENT: no scleral icterus, pupils equal round and reactive, no palptable cervical adenopathy,  CV: RRR, no mrg, no jvd Resp: Clear to auscultation bilaterally GI: abdomen is soft, non-tender, non-distended, normal bowel sounds, no hepatosplenomegaly MSK: extremities are warm, no edema.  Skin: warm, no rash Neuro:  no focal deficits Psych: appropriate affect   Diagnostic Studies  01/2017 cath Dist LAD lesion, 100 %stenosed. Prox RCA lesion, 40 %stenosed. 3rd Mrg lesion, 40 %stenosed. There is severe left ventricular systolic dysfunction. The left ventricular ejection fraction is less than 25% by visual estimate. LV end diastolic pressure is severely elevated.  Hemodynamic findings consistent with moderate pulmonary hypertension.   Acute atrial fibrillation developed on the cath table during right heart/right atrial pressure recording. Rate 118 bpm. Atrial fibrillation was treated with one bolus of IV amiodarone. 40% proximal RCA stenosis. Tortuous but normal circumflex 100% distal/apical LAD stenosis. This lesion is probably chronic but difficult to determine age. There is also a possibility that it is acute embolic occlusion from a catheter-based thrombus. For that reason additional heparin and then Aggrastat bolus was given as the patient complained of chest pressure post procedure. Chest pressure post procedure is similar to prior episodes. Severe LV dysfunction with EF less than 25%. Contraction pattern appears global. Arterial blood gas demonstrated severe CO2 retention, pCO2 67.       01/2017 echo Study Conclusions   - Left ventricle: The cavity size was mildly dilated. Wall   thickness was increased in a pattern of moderate LVH. Systolic   function was moderately reduced. The estimated ejection fraction   was in the range of 35% to 40%. Diffuse hypokinesis. - Aortic valve: Mildly calcified annulus. Trileaflet; mildly   thickened leaflets. -  Mitral valve: There was mild regurgitation. - Left atrium: The atrium was moderately dilated. - Atrial septum: The septum bowed from left to right, consistent   with increased left atrial pressure. - Pulmonic valve: There was mild regurgitation.     07/2017 echo Study Conclusions   - Left ventricle: The cavity size was normal. Wall thickness was   increased increased in a pattern of mild to moderate LVH.   Systolic function was normal. The estimated ejection fraction was   in the range of 50% to 55%. Diastolic function is abnormal,   indeterminant grade. LVOT VTI and Vmax likely underestimated   based on angle of acquisition. - Aortic valve: Valve area (VTI): 1.56 cm^2. Valve area (Vmax):   1.84 cm^2. - Technically difficult study.   Assessment and Plan  1. Chronic diastolic HF - euvovolemic today, has actually not had to take her lasix at home for some time - continue to monitor   2. HTN - at goal, continue current meds   3. Hyperlipidemia -continue current meds, we will request lab from pcp  4. Afib/acquired thrombophilia - no symptoms. EKG today shows NSR - continue current meds including eliquis for stroke prevention.   F/u 6 months     Arnoldo Lenis, M.D.

## 2022-07-05 NOTE — Patient Instructions (Signed)
Medication Instructions:  Continue all current medications.   Labwork: none  Testing/Procedures: none  Follow-Up: 6 months   Any Other Special Instructions Will Be Listed Below (If Applicable).   If you need a refill on your cardiac medications before your next appointment, please call your pharmacy.  

## 2022-07-13 DIAGNOSIS — J441 Chronic obstructive pulmonary disease with (acute) exacerbation: Secondary | ICD-10-CM | POA: Diagnosis not present

## 2022-07-13 DIAGNOSIS — I5022 Chronic systolic (congestive) heart failure: Secondary | ICD-10-CM | POA: Diagnosis not present

## 2022-07-28 DIAGNOSIS — I10 Essential (primary) hypertension: Secondary | ICD-10-CM | POA: Diagnosis not present

## 2022-07-28 DIAGNOSIS — Z789 Other specified health status: Secondary | ICD-10-CM | POA: Diagnosis not present

## 2022-07-28 DIAGNOSIS — Z299 Encounter for prophylactic measures, unspecified: Secondary | ICD-10-CM | POA: Diagnosis not present

## 2022-07-28 DIAGNOSIS — M5441 Lumbago with sciatica, right side: Secondary | ICD-10-CM | POA: Diagnosis not present

## 2022-08-02 DIAGNOSIS — J441 Chronic obstructive pulmonary disease with (acute) exacerbation: Secondary | ICD-10-CM | POA: Diagnosis not present

## 2022-08-02 DIAGNOSIS — I5022 Chronic systolic (congestive) heart failure: Secondary | ICD-10-CM | POA: Diagnosis not present

## 2022-08-25 ENCOUNTER — Encounter: Payer: Self-pay | Admitting: *Deleted

## 2022-09-02 DIAGNOSIS — I5022 Chronic systolic (congestive) heart failure: Secondary | ICD-10-CM | POA: Diagnosis not present

## 2022-09-02 DIAGNOSIS — J441 Chronic obstructive pulmonary disease with (acute) exacerbation: Secondary | ICD-10-CM | POA: Diagnosis not present

## 2022-09-16 DIAGNOSIS — Z299 Encounter for prophylactic measures, unspecified: Secondary | ICD-10-CM | POA: Diagnosis not present

## 2022-09-16 DIAGNOSIS — E1165 Type 2 diabetes mellitus with hyperglycemia: Secondary | ICD-10-CM | POA: Diagnosis not present

## 2022-09-16 DIAGNOSIS — I10 Essential (primary) hypertension: Secondary | ICD-10-CM | POA: Diagnosis not present

## 2022-09-16 DIAGNOSIS — M545 Low back pain, unspecified: Secondary | ICD-10-CM | POA: Diagnosis not present

## 2022-09-24 DIAGNOSIS — M47816 Spondylosis without myelopathy or radiculopathy, lumbar region: Secondary | ICD-10-CM | POA: Diagnosis not present

## 2022-09-24 DIAGNOSIS — M47817 Spondylosis without myelopathy or radiculopathy, lumbosacral region: Secondary | ICD-10-CM | POA: Diagnosis not present

## 2022-09-24 DIAGNOSIS — M48061 Spinal stenosis, lumbar region without neurogenic claudication: Secondary | ICD-10-CM | POA: Diagnosis not present

## 2022-09-24 DIAGNOSIS — M5137 Other intervertebral disc degeneration, lumbosacral region: Secondary | ICD-10-CM | POA: Diagnosis not present

## 2022-09-24 DIAGNOSIS — M4807 Spinal stenosis, lumbosacral region: Secondary | ICD-10-CM | POA: Diagnosis not present

## 2022-09-24 DIAGNOSIS — M5126 Other intervertebral disc displacement, lumbar region: Secondary | ICD-10-CM | POA: Diagnosis not present

## 2022-09-24 DIAGNOSIS — M5136 Other intervertebral disc degeneration, lumbar region: Secondary | ICD-10-CM | POA: Diagnosis not present

## 2022-09-28 DIAGNOSIS — J209 Acute bronchitis, unspecified: Secondary | ICD-10-CM | POA: Diagnosis not present

## 2022-09-28 DIAGNOSIS — I10 Essential (primary) hypertension: Secondary | ICD-10-CM | POA: Diagnosis not present

## 2022-09-28 DIAGNOSIS — Z299 Encounter for prophylactic measures, unspecified: Secondary | ICD-10-CM | POA: Diagnosis not present

## 2022-09-28 DIAGNOSIS — M545 Low back pain, unspecified: Secondary | ICD-10-CM | POA: Diagnosis not present

## 2022-09-28 DIAGNOSIS — J440 Chronic obstructive pulmonary disease with acute lower respiratory infection: Secondary | ICD-10-CM | POA: Diagnosis not present

## 2022-10-02 DIAGNOSIS — I5022 Chronic systolic (congestive) heart failure: Secondary | ICD-10-CM | POA: Diagnosis not present

## 2022-10-02 DIAGNOSIS — J441 Chronic obstructive pulmonary disease with (acute) exacerbation: Secondary | ICD-10-CM | POA: Diagnosis not present

## 2022-11-02 DIAGNOSIS — J441 Chronic obstructive pulmonary disease with (acute) exacerbation: Secondary | ICD-10-CM | POA: Diagnosis not present

## 2022-11-02 DIAGNOSIS — I5022 Chronic systolic (congestive) heart failure: Secondary | ICD-10-CM | POA: Diagnosis not present

## 2022-11-30 ENCOUNTER — Other Ambulatory Visit: Payer: Self-pay | Admitting: Cardiology

## 2022-12-01 DIAGNOSIS — I10 Essential (primary) hypertension: Secondary | ICD-10-CM | POA: Diagnosis not present

## 2022-12-01 DIAGNOSIS — J209 Acute bronchitis, unspecified: Secondary | ICD-10-CM | POA: Diagnosis not present

## 2022-12-01 DIAGNOSIS — R059 Cough, unspecified: Secondary | ICD-10-CM | POA: Diagnosis not present

## 2022-12-01 DIAGNOSIS — J440 Chronic obstructive pulmonary disease with acute lower respiratory infection: Secondary | ICD-10-CM | POA: Diagnosis not present

## 2022-12-01 DIAGNOSIS — Z299 Encounter for prophylactic measures, unspecified: Secondary | ICD-10-CM | POA: Diagnosis not present

## 2022-12-02 DIAGNOSIS — I5022 Chronic systolic (congestive) heart failure: Secondary | ICD-10-CM | POA: Diagnosis not present

## 2022-12-02 DIAGNOSIS — J441 Chronic obstructive pulmonary disease with (acute) exacerbation: Secondary | ICD-10-CM | POA: Diagnosis not present

## 2022-12-14 DIAGNOSIS — Z79891 Long term (current) use of opiate analgesic: Secondary | ICD-10-CM | POA: Diagnosis not present

## 2022-12-14 DIAGNOSIS — Z79899 Other long term (current) drug therapy: Secondary | ICD-10-CM | POA: Diagnosis not present

## 2022-12-14 DIAGNOSIS — G894 Chronic pain syndrome: Secondary | ICD-10-CM | POA: Diagnosis not present

## 2022-12-15 ENCOUNTER — Encounter: Payer: Self-pay | Admitting: Cardiology

## 2022-12-15 ENCOUNTER — Ambulatory Visit: Payer: Medicare Other | Attending: Cardiology | Admitting: Cardiology

## 2022-12-15 VITALS — BP 108/60 | HR 88 | Ht 65.0 in | Wt 245.4 lb

## 2022-12-15 DIAGNOSIS — I480 Paroxysmal atrial fibrillation: Secondary | ICD-10-CM

## 2022-12-15 DIAGNOSIS — I5032 Chronic diastolic (congestive) heart failure: Secondary | ICD-10-CM

## 2022-12-15 DIAGNOSIS — E782 Mixed hyperlipidemia: Secondary | ICD-10-CM | POA: Diagnosis not present

## 2022-12-15 DIAGNOSIS — I10 Essential (primary) hypertension: Secondary | ICD-10-CM

## 2022-12-15 DIAGNOSIS — D6869 Other thrombophilia: Secondary | ICD-10-CM

## 2022-12-15 NOTE — Progress Notes (Signed)
Clinical Summary Pamela Soto is a 72 y.o.female seen today for follow up of the following medical problems.      1. NICM/Chronic systolic HF/NSTEMI - admit Jan 2018 with CHF - echo at that time showed LVEF 35-40%, a new finding for the patient. - referred for cath. Feb 2018 cath: distal LAD 100%, Prox RCA 40%, OM3 40%. RHC mean PA 37, PCWP 26, CI 1.73. CAD medically managed.  - repeat echo 07/2017: LVEF 50-55%, abnormal diastolic function  05/4331 echo: LVEF 60-65%< no WMAs, indet diastolic 95/1884 nuclear stress no ischemia.    - no recent edema. Down 17 lbs since July, no longer requiring diuretic.       2.SOB - seen by Dr Percival Spanish 05/2021 in clinic for SOB - CXR no acute process - had not been taking her lasix at the time, was to take more regularly.    From last pulm note:  "No worse off trelegy though she clearly has an asthmatic component so advised to restart immediately for any flare as her main problem is restrictive, not obstructive, but may  be prone to exac and needs to keep saba handy in case needs it for rescue".   10/2021 nuclear stress no ischemia.         3. COPD - 03/2017 PFTs: severe COPD - followed by Dr Melvyn Novas   - recent admit 05/2022 with COPD exacerbation - has O2 at home to use as needed     4. Afib - denies any palpitations - no bleeding on eliquis.      5. Hyperlipidemia - reports nonspecific neck and shoulder pains, unclear if related to statin.  - somewhat improved with lower statin dosing - 07/2017 TC 131 TG 108 HDL 48 LDL 61 - atorvastatin recently increased to '40mg'$  daily. Since that time has had some stomach upset for few week, constiptation alternating with diarrhea.   - tolerating atorva '40mg'$  daily - reports most recent labs done by pcp  06/2022 TC 134 TG 134 HDL 53 LDL 58     6. DM2 - followed by pcp     7. OSA - sleep study 03/2017 with severe OSA. On bipap Past Medical History:  Diagnosis Date   Acid reflux     Hypertension    Sleep apnea      No Known Allergies   Current Outpatient Medications  Medication Sig Dispense Refill   apixaban (ELIQUIS) 5 MG TABS tablet TAKE ONE TABLET BY MOUTH TWICE DAILY. 60 tablet 5   atorvastatin (LIPITOR) 40 MG tablet Take 40 mg by mouth daily.     bisoprolol (ZEBETA) 5 MG tablet TAKE ONE TABLET BY MOUTH DAILY 90 tablet 1   Budeson-Glycopyrrol-Formoterol (BREZTRI AEROSPHERE) 160-9-4.8 MCG/ACT AERO Take 2 puffs first thing in am and then another 2 puffs about 12 hours later. 10.7 g 11   furosemide (LASIX) 20 MG tablet Take 0.5 tablets (10 mg total) by mouth daily. (Patient not taking: Reported on 07/05/2022) 15 tablet 6   glimepiride (AMARYL) 2 MG tablet Take 2 mg by mouth daily.     hydrALAZINE (APRESOLINE) 25 MG tablet Take 1 tablet (25 mg total) by mouth 2 (two) times daily. (Patient not taking: Reported on 07/05/2022) 60 tablet 3   telmisartan (MICARDIS) 40 MG tablet Take 1 tablet (40 mg total) by mouth daily. (Patient not taking: Reported on 07/05/2022) 90 tablet 2   No current facility-administered medications for this visit.     Past Surgical History:  Procedure Laterality Date   BIOPSY  09/13/2017   Procedure: BIOPSY;  Surgeon: Danie Binder, MD;  Location: [facility];  Service: Endoscopy;;  colon duodenum gastric   BREAST BIOPSY Right    normal result   CESAREAN SECTION     CHOLECYSTECTOMY  11/06/2012   Procedure: LAPAROSCOPIC CHOLECYSTECTOMY WITH INTRAOPERATIVE CHOLANGIOGRAM;  Surgeon: Pedro Earls, MD;  Location: [facility];  Service: General;  Laterality: N/A;   COLONOSCOPY N/A 09/13/2017   Procedure: COLONOSCOPY;  Surgeon: Danie Binder, MD;  [facility]: [facility];  Service: Endoscopy;  Laterality: N/A;  245   ESOPHAGOGASTRODUODENOSCOPY N/A 09/13/2017   Procedure: ESOPHAGOGASTRODUODENOSCOPY (EGD);  Surgeon: Danie Binder, MD;  Location: [facility];  Service: Endoscopy;  Laterality: N/A;   POLYPECTOMY  09/13/2017   Procedure:  POLYPECTOMY;  Surgeon: Danie Binder, MD;  Location: [facility];  Service: Endoscopy;;  colon   RIGHT/LEFT HEART CATH AND CORONARY ANGIOGRAPHY N/A 01/10/2017   Procedure: Right/Left Heart Cath and Coronary Angiography;  Surgeon: Belva Crome, MD;  Location: [place] [facility];  Service: Cardiovascular;  Laterality: N/A;   UMBILICAL HERNIA REPAIR  11/06/2012   Procedure: HERNIA REPAIR UMBILICAL ADULT;  Surgeon: Pedro Earls, MD;  Location: [facility];  Service: General;;     No Known Allergies    Family History  Problem Relation Age of Onset   Heart disease Mother 53   Heart attack Mother    Asthma Father    Cancer Father        pancreastic cancer   Breast cancer Sister 69   Cancer Sister        unsure   Heart attack Brother 9   Colon cancer Neg Hx      Social History Pamela Soto reports that she has never smoked. She has never used smokeless tobacco. Pamela Soto reports no history of alcohol use.   Review of Systems CONSTITUTIONAL: No weight loss, fever, chills, weakness or fatigue.  HEENT: Eyes: No visual loss, blurred vision, double vision or yellow sclerae.No hearing loss, sneezing, congestion, runny nose or sore throat.  SKIN: No rash or itching.  CARDIOVASCULAR: per hpi RESPIRATORY: No shortness of breath, cough or sputum.  GASTROINTESTINAL: No anorexia, nausea, vomiting or diarrhea. No abdominal pain or blood.  GENITOURINARY: No burning on urination, no polyuria NEUROLOGICAL: No headache, dizziness, syncope, paralysis, ataxia, numbness or tingling in the extremities. No change in bowel or bladder control.  MUSCULOSKELETAL: No muscle, back pain, joint pain or stiffness.  LYMPHATICS: No enlarged nodes. No history of splenectomy.  PSYCHIATRIC: No history of depression or anxiety.  ENDOCRINOLOGIC: No reports of sweating, cold or heat intolerance. No polyuria or polydipsia.  [name]   Physical Examination Today's Vitals   12/15/22 1543  BP: 108/60  Pulse: 88  SpO2:  92%  Weight: 245 lb 6.4 oz (111.3 kg)  Height: '5\' 5"'$  (1.651 m)   Body mass index is 40.84 kg/m.  Gen: resting comfortably, no acute distress HEENT: no scleral icterus, pupils equal round and reactive, no palptable cervical adenopathy,  CV: RRR, no m/rg, no jvd Resp: Clear to auscultation bilaterally GI: abdomen is soft, non-tender, non-distended, normal bowel sounds, no hepatosplenomegaly MSK: extremities are warm, no edema.  Skin: warm, no rash Neuro:  no focal deficits Psych: appropriate affect   Diagnostic Studies  01/2017 cath Dist LAD lesion, 100 %stenosed. Prox RCA lesion, 40 %stenosed. 3rd Mrg lesion, 40 %stenosed. There is severe left ventricular systolic dysfunction. The left  ventricular ejection fraction is less than 25% by visual estimate. LV end diastolic pressure is severely elevated. Hemodynamic findings consistent with moderate pulmonary hypertension.   Acute atrial fibrillation developed on the cath table during right heart/right atrial pressure recording. Rate 118 bpm. Atrial fibrillation was treated with one bolus of IV amiodarone. 40% proximal RCA stenosis. Tortuous but normal circumflex 100% distal/apical LAD stenosis. This lesion is probably chronic but difficult to determine age. There is also a possibility that it is acute embolic occlusion from a catheter-based thrombus. For that reason additional heparin and then Aggrastat bolus was given as the patient complained of chest pressure post procedure. Chest pressure post procedure is similar to prior episodes. Severe LV dysfunction with EF less than 25%. Contraction pattern appears global. Arterial blood gas demonstrated severe CO2 retention, pCO2 67.       01/2017 echo Study Conclusions   - Left ventricle: The cavity size was mildly dilated. Wall   thickness was increased in a pattern of moderate LVH. Systolic   function was moderately reduced. The estimated ejection fraction   was in the range of 35%  to 40%. Diffuse hypokinesis. - Aortic valve: Mildly calcified annulus. Trileaflet; mildly   thickened leaflets. - Mitral valve: There was mild regurgitation. - Left atrium: The atrium was moderately dilated. - Atrial septum: The septum bowed from left to right, consistent   with increased left atrial pressure. - Pulmonic valve: There was mild regurgitation.     07/2017 echo Study Conclusions   - Left ventricle: The cavity size was normal. Wall thickness was   increased increased in a pattern of mild to moderate LVH.   Systolic function was normal. The estimated ejection fraction was   in the range of 50% to 55%. Diastolic function is abnormal,   indeterminant grade. LVOT VTI and Vmax likely underestimated   based on angle of acquisition. - Aortic valve: Valve area (VTI): 1.56 cm^2. Valve area (Vmax):   1.84 cm^2. - Technically difficult study.   Assessment and Plan  1. Chronic diastolic HF -remains euvolemic, no longer requires diuretic at this time - continue to monitor   2. HTN - bp is at goal, continue current meds   3. Hyperlipidemia - lipids at goal, continue current meds   4. Afib/acquired thrombophilia -n symptoms, continue current meds including eliquis for stroke prevention   F/u 6 months      Arnoldo Lenis, M.D.

## 2022-12-15 NOTE — Patient Instructions (Signed)
Medication Instructions:  Continue all current medications.   Labwork: none  Testing/Procedures: none  Follow-Up: 6 months   Any Other Special Instructions Will Be Listed Below (If Applicable).   If you need a refill on your cardiac medications before your next appointment, please call your pharmacy.  

## 2022-12-24 ENCOUNTER — Other Ambulatory Visit: Payer: Self-pay | Admitting: Cardiology

## 2023-01-02 DIAGNOSIS — J441 Chronic obstructive pulmonary disease with (acute) exacerbation: Secondary | ICD-10-CM | POA: Diagnosis not present

## 2023-01-02 DIAGNOSIS — I5022 Chronic systolic (congestive) heart failure: Secondary | ICD-10-CM | POA: Diagnosis not present

## 2023-01-26 DIAGNOSIS — I5032 Chronic diastolic (congestive) heart failure: Secondary | ICD-10-CM | POA: Diagnosis not present

## 2023-01-26 DIAGNOSIS — Z299 Encounter for prophylactic measures, unspecified: Secondary | ICD-10-CM | POA: Diagnosis not present

## 2023-01-26 DIAGNOSIS — I10 Essential (primary) hypertension: Secondary | ICD-10-CM | POA: Diagnosis not present

## 2023-01-26 DIAGNOSIS — E1165 Type 2 diabetes mellitus with hyperglycemia: Secondary | ICD-10-CM | POA: Diagnosis not present

## 2023-01-26 DIAGNOSIS — M545 Low back pain, unspecified: Secondary | ICD-10-CM | POA: Diagnosis not present

## 2023-02-02 DIAGNOSIS — I5022 Chronic systolic (congestive) heart failure: Secondary | ICD-10-CM | POA: Diagnosis not present

## 2023-02-02 DIAGNOSIS — J441 Chronic obstructive pulmonary disease with (acute) exacerbation: Secondary | ICD-10-CM | POA: Diagnosis not present

## 2023-02-03 ENCOUNTER — Encounter: Payer: Self-pay | Admitting: Radiology

## 2023-02-25 DIAGNOSIS — R5383 Other fatigue: Secondary | ICD-10-CM | POA: Diagnosis not present

## 2023-02-25 DIAGNOSIS — Z7189 Other specified counseling: Secondary | ICD-10-CM | POA: Diagnosis not present

## 2023-02-25 DIAGNOSIS — I10 Essential (primary) hypertension: Secondary | ICD-10-CM | POA: Diagnosis not present

## 2023-02-25 DIAGNOSIS — E7800 Pure hypercholesterolemia, unspecified: Secondary | ICD-10-CM | POA: Diagnosis not present

## 2023-02-25 DIAGNOSIS — Z79899 Other long term (current) drug therapy: Secondary | ICD-10-CM | POA: Diagnosis not present

## 2023-02-25 DIAGNOSIS — Z299 Encounter for prophylactic measures, unspecified: Secondary | ICD-10-CM | POA: Diagnosis not present

## 2023-02-25 DIAGNOSIS — Z0000 Encounter for general adult medical examination without abnormal findings: Secondary | ICD-10-CM | POA: Diagnosis not present

## 2023-03-03 DIAGNOSIS — I5022 Chronic systolic (congestive) heart failure: Secondary | ICD-10-CM | POA: Diagnosis not present

## 2023-03-03 DIAGNOSIS — J441 Chronic obstructive pulmonary disease with (acute) exacerbation: Secondary | ICD-10-CM | POA: Diagnosis not present

## 2023-03-10 ENCOUNTER — Other Ambulatory Visit: Payer: Self-pay | Admitting: Cardiology

## 2023-03-10 DIAGNOSIS — I480 Paroxysmal atrial fibrillation: Secondary | ICD-10-CM

## 2023-03-11 NOTE — Telephone Encounter (Signed)
Prescription refill request for Eliquis received. Indication: Afib  Last office visit: 12/15/22 ([facility])  Scr: 1.07 (05/13/22)  Age: 72 Weight: 111.3kg  Appropriate dose. Refill sent.

## 2023-04-03 DIAGNOSIS — I5022 Chronic systolic (congestive) heart failure: Secondary | ICD-10-CM | POA: Diagnosis not present

## 2023-04-03 DIAGNOSIS — J441 Chronic obstructive pulmonary disease with (acute) exacerbation: Secondary | ICD-10-CM | POA: Diagnosis not present

## 2023-05-03 DIAGNOSIS — I5022 Chronic systolic (congestive) heart failure: Secondary | ICD-10-CM | POA: Diagnosis not present

## 2023-05-03 DIAGNOSIS — J441 Chronic obstructive pulmonary disease with (acute) exacerbation: Secondary | ICD-10-CM | POA: Diagnosis not present

## 2023-05-19 DIAGNOSIS — Z299 Encounter for prophylactic measures, unspecified: Secondary | ICD-10-CM | POA: Diagnosis not present

## 2023-05-19 DIAGNOSIS — I4891 Unspecified atrial fibrillation: Secondary | ICD-10-CM | POA: Diagnosis not present

## 2023-05-19 DIAGNOSIS — I152 Hypertension secondary to endocrine disorders: Secondary | ICD-10-CM | POA: Diagnosis not present

## 2023-05-19 DIAGNOSIS — I10 Essential (primary) hypertension: Secondary | ICD-10-CM | POA: Diagnosis not present

## 2023-05-19 DIAGNOSIS — E1159 Type 2 diabetes mellitus with other circulatory complications: Secondary | ICD-10-CM | POA: Diagnosis not present

## 2023-05-19 DIAGNOSIS — M5440 Lumbago with sciatica, unspecified side: Secondary | ICD-10-CM | POA: Diagnosis not present

## 2023-05-19 DIAGNOSIS — I5032 Chronic diastolic (congestive) heart failure: Secondary | ICD-10-CM | POA: Diagnosis not present

## 2023-06-03 DIAGNOSIS — I5022 Chronic systolic (congestive) heart failure: Secondary | ICD-10-CM | POA: Diagnosis not present

## 2023-06-03 DIAGNOSIS — J441 Chronic obstructive pulmonary disease with (acute) exacerbation: Secondary | ICD-10-CM | POA: Diagnosis not present

## 2023-06-21 ENCOUNTER — Ambulatory Visit: Payer: 59 | Attending: Cardiology | Admitting: Cardiology

## 2023-06-21 VITALS — BP 144/80 | HR 70 | Ht 65.0 in | Wt 243.6 lb

## 2023-06-21 DIAGNOSIS — D6869 Other thrombophilia: Secondary | ICD-10-CM

## 2023-06-21 DIAGNOSIS — I480 Paroxysmal atrial fibrillation: Secondary | ICD-10-CM | POA: Diagnosis not present

## 2023-06-21 DIAGNOSIS — Z8679 Personal history of other diseases of the circulatory system: Secondary | ICD-10-CM | POA: Diagnosis not present

## 2023-06-21 DIAGNOSIS — E782 Mixed hyperlipidemia: Secondary | ICD-10-CM

## 2023-06-21 DIAGNOSIS — G4733 Obstructive sleep apnea (adult) (pediatric): Secondary | ICD-10-CM

## 2023-06-21 DIAGNOSIS — R030 Elevated blood-pressure reading, without diagnosis of hypertension: Secondary | ICD-10-CM | POA: Diagnosis not present

## 2023-06-21 NOTE — Progress Notes (Signed)
Clinical Summary Pamela Soto is a 72 y.o.female seen today for follow up of the following medical problems.      1. History of cardiomyopathy - admit Jan 2018 with CHF - echo at that time showed LVEF 35-40%, a new finding for the patient. - referred for cath. Feb 2018 cath: distal LAD 100%, Prox RCA 40%, OM3 40%. RHC mean PA 37, PCWP 26, CI 1.73. CAD medically managed.   - repeat echo 07/2017: LVEF 50-55%, abnormal diastolic function  05/2021 echo: LVEF 60-65%< no WMAs, indet diastolic 10/2021 nuclear stress no ischemia.        - no recent edema. Chronic stable SOB. Has lost about 20 lbs since last year - no longer requires a diuretic.   2.CAD  . No chest pains -  referred for cath. Feb 2018 cath: distal LAD 100%, Prox RCA 40%, OM3 40%. RHC mean PA 37, PCWP 26, CI 1.73. CAD medically managed. -10/2021 nuclear stress no ischemia.     3.SOB - seen by Dr Antoine Poche 05/2021 in clinic for SOB - CXR no acute process - had not been taking her lasix at the time, was to take more regularly.    From last pulm note:  "No worse off trelegy though she clearly has an asthmatic component so advised to restart immediately for any flare as her main problem is restrictive, not obstructive, but may  be prone to exac and needs to keep saba handy in case needs it for rescue".   10/2021 nuclear stress no ischemia.   - chronic stable symptoms.        3. COPD - 03/2017 PFTs: severe COPD - followed by Dr Sherene Sires        4. Afib - no recent palpitaitions - no bleeding on eliquis.      5. Hyperlipidemia - reports nonspecific neck and shoulder pains, unclear if related to statin.  - somewhat improved with lower statin dosing - 07/2017 TC 131 TG 108 HDL 48 LDL 61 - atorvastatin recently increased to 40mg  daily. Since that time has had some stomach upset for few week, constiptation alternating with diarrhea.   - tolerating atorva 40mg  daily - reports most recent labs done by pcp   06/2022  TC 134 TG 134 HDL 53 LDL 58 - upcoming labs with pcp     6. DM2 - followed by pcp     7. OSA - sleep study 03/2017 with severe OSA. On bipap - has not followed with sleep medicine in several years    Past Medical History:  Diagnosis Date   Acid reflux    Hypertension    Sleep apnea      No Known Allergies   Current Outpatient Medications  Medication Sig Dispense Refill   atorvastatin (LIPITOR) 40 MG tablet TAKE ONE TABLET BY MOUTH DAILY 90 tablet 1   bisoprolol (ZEBETA) 5 MG tablet TAKE ONE TABLET BY MOUTH DAILY 90 tablet 1   Budeson-Glycopyrrol-Formoterol (BREZTRI AEROSPHERE) 160-9-4.8 MCG/ACT AERO Take 2 puffs first thing in am and then another 2 puffs about 12 hours later. 10.7 g 11   ELIQUIS 5 MG TABS tablet TAKE ONE TABLET BY MOUTH TWICE DAILY 60 tablet 5   glimepiride (AMARYL) 2 MG tablet Take 2 mg by mouth daily.     No current facility-administered medications for this visit.     Past Surgical History:  Procedure Laterality Date   BIOPSY  09/13/2017   Procedure: BIOPSY;  Surgeon: West Bali,  MD;  [facility]: [facility];  Service: Endoscopy;;  colon duodenum gastric   BREAST BIOPSY Right    normal result   CESAREAN SECTION     CHOLECYSTECTOMY  11/06/2012   Procedure: LAPAROSCOPIC CHOLECYSTECTOMY WITH INTRAOPERATIVE CHOLANGIOGRAM;  Surgeon: Valarie Merino, MD;  Location: [facility];  Service: General;  Laterality: N/A;   COLONOSCOPY N/A 09/13/2017   Procedure: COLONOSCOPY;  Surgeon: West Bali, MD;  Location: [facility];  Service: Endoscopy;  Laterality: N/A;  245   ESOPHAGOGASTRODUODENOSCOPY N/A 09/13/2017   Procedure: ESOPHAGOGASTRODUODENOSCOPY (EGD);  Surgeon: West Bali, MD;  Location: [facility];  Service: Endoscopy;  Laterality: N/A;   POLYPECTOMY  09/13/2017   Procedure: POLYPECTOMY;  Surgeon: West Bali, MD;  [facility]: [facility];  Service: Endoscopy;;  colon   RIGHT/LEFT HEART CATH AND CORONARY ANGIOGRAPHY N/A 01/10/2017    Procedure: Right/Left Heart Cath and Coronary Angiography;  Surgeon: Lyn Records, MD;  Location: [facility];  Service: Cardiovascular;  Laterality: N/A;   UMBILICAL HERNIA REPAIR  11/06/2012   Procedure: HERNIA REPAIR UMBILICAL ADULT;  Surgeon: Valarie Merino, MD;  Location: [facility];  Service: General;;     No Known Allergies    Family History  Problem Relation Age of Onset   Heart disease Mother 5   Heart attack Mother    Asthma Father    Cancer Father        pancreastic cancer   Breast cancer Sister 28   Cancer Sister        unsure   Heart attack Brother 38   Colon cancer Neg Hx      Social History Ms. Hanko reports that she has never smoked. She has never used smokeless tobacco. Ms. Qadri reports no history of alcohol use.   Review of Systems CONSTITUTIONAL: No weight loss, fever, chills, weakness or fatigue.  HEENT: Eyes: No visual loss, blurred vision, double vision or yellow sclerae.No hearing loss, sneezing, congestion, runny nose or sore throat.  SKIN: No rash or itching.  CARDIOVASCULAR: per hpi RESPIRATORY: No shortness of breath, cough or sputum.  GASTROINTESTINAL: No anorexia, nausea, vomiting or diarrhea. No abdominal pain or blood.  GENITOURINARY: No burning on urination, no polyuria NEUROLOGICAL: No headache, dizziness, syncope, paralysis, ataxia, numbness or tingling in the extremities. No change in bowel or bladder control.  MUSCULOSKELETAL: No muscle, back pain, joint pain or stiffness.  LYMPHATICS: No enlarged nodes. No history of splenectomy.  PSYCHIATRIC: No history of depression or anxiety.  ENDOCRINOLOGIC: No reports of sweating, cold or heat intolerance. No polyuria or polydipsia.  [name]   Physical Examination Today's Vitals   06/21/23 1440 06/21/23 1520  BP: (!) 144/70 (!) 144/80  Pulse: 70   SpO2: 95%   Weight: 243 lb 9.6 oz (110.5 kg)   Height: 5\' 5"  (1.651 m)    Body mass index is 40.54 kg/m.  Gen: resting comfortably, no  acute distress HEENT: no scleral icterus, pupils equal round and reactive, no palptable cervical adenopathy,  CV: RRR, no m/rg, no jvd Resp: Clear to auscultation bilaterally GI: abdomen is soft, non-tender, non-distended, normal bowel sounds, no hepatosplenomegaly MSK: extremities are warm, no edema.  Skin: warm, no rash Neuro:  no focal deficits Psych: appropriate affect   Diagnostic Studies 01/2017 cath Dist LAD lesion, 100 %stenosed. Prox RCA lesion, 40 %stenosed. 3rd Mrg lesion, 40 %stenosed. There is severe left ventricular systolic dysfunction. The left ventricular ejection fraction is less than 25% by visual  estimate. LV end diastolic pressure is severely elevated. Hemodynamic findings consistent with moderate pulmonary hypertension.   Acute atrial fibrillation developed on the cath table during right heart/right atrial pressure recording. Rate 118 bpm. Atrial fibrillation was treated with one bolus of IV amiodarone. 40% proximal RCA stenosis. Tortuous but normal circumflex 100% distal/apical LAD stenosis. This lesion is probably chronic but difficult to determine age. There is also a possibility that it is acute embolic occlusion from a catheter-based thrombus. For that reason additional heparin and then Aggrastat bolus was given as the patient complained of chest pressure post procedure. Chest pressure post procedure is similar to prior episodes. Severe LV dysfunction with EF less than 25%. Contraction pattern appears global. Arterial blood gas demonstrated severe CO2 retention, pCO2 67.       01/2017 echo Study Conclusions   - Left ventricle: The cavity size was mildly dilated. Wall   thickness was increased in a pattern of moderate LVH. Systolic   function was moderately reduced. The estimated ejection fraction   was in the range of 35% to 40%. Diffuse hypokinesis. - Aortic valve: Mildly calcified annulus. Trileaflet; mildly   thickened leaflets. - Mitral valve:  There was mild regurgitation. - Left atrium: The atrium was moderately dilated. - Atrial septum: The septum bowed from left to right, consistent   with increased left atrial pressure. - Pulmonic valve: There was mild regurgitation.     07/2017 echo Study Conclusions   - Left ventricle: The cavity size was normal. Wall thickness was   increased increased in a pattern of mild to moderate LVH.   Systolic function was normal. The estimated ejection fraction was   in the range of 50% to 55%. Diastolic function is abnormal,   indeterminant grade. LVOT VTI and Vmax likely underestimated   based on angle of acquisition. - Aortic valve: Valve area (VTI): 1.56 cm^2. Valve area (Vmax):   1.84 cm^2. - Technically difficult study.    Assessment and Plan   1.History of cardiomyopathy - LVEF has normalized, euvolemic no longer reqiring diuretic - continue current meds   2. Elevated blood pressure - bp elevated today, isolated high bp. Will come back for nursing visit for recheck, if elevated would likely start norvasc.    3. Hyperlipidemia -at goal, continue current meds   4. Afib/acquired thrombophilia -denies symptoms, continue current meds. She is on eliquis for stroke prevention - EKG shows normal sinus rhytm  5. OSA - on bipap at home. Several years since sleep evaluation, refer to pulmonary     Antoine Poche, M.D..

## 2023-06-21 NOTE — Patient Instructions (Signed)
Medication Instructions:   Continue all current medications.   Labwork:  none  Testing/Procedures:  none  Follow-Up:  6 months   Any Other Special Instructions Will Be Listed Below (If Applicable).  You have been referred to Pulmonology  Nurse visit in the next 1-2 weeks for vitals   If you need a refill on your cardiac medications before your next appointment, please call your pharmacy.

## 2023-07-03 DIAGNOSIS — J441 Chronic obstructive pulmonary disease with (acute) exacerbation: Secondary | ICD-10-CM | POA: Diagnosis not present

## 2023-07-03 DIAGNOSIS — I5022 Chronic systolic (congestive) heart failure: Secondary | ICD-10-CM | POA: Diagnosis not present

## 2023-07-05 ENCOUNTER — Ambulatory Visit: Payer: 59 | Attending: Internal Medicine

## 2023-07-05 VITALS — BP 148/78 | HR 53 | Ht 65.0 in | Wt 240.4 lb

## 2023-07-05 DIAGNOSIS — Z8679 Personal history of other diseases of the circulatory system: Secondary | ICD-10-CM | POA: Diagnosis not present

## 2023-07-05 NOTE — Progress Notes (Signed)
Patient in today for Nurse Visit for Vitals.  Denied any shortness of breath and chest pain. Only pain she was having was back pain. She didn't take any of her medications this morning.  BP- 152/70 right arm BP- 148/78 Left am  Only other issue patient stated that she was not getting enough sleep. Cannot sleep at night.

## 2023-07-05 NOTE — Progress Notes (Signed)
BP too high, please start norvasc 5mg  daily and repeat nursing visit with vitals check in 2 weeks  Dominga Ferry MD

## 2023-07-06 MED ORDER — AMLODIPINE BESYLATE 5 MG PO TABS: 5.0000 mg | ORAL_TABLET | Freq: Every day | ORAL | 1 refills | Status: DC

## 2023-07-06 NOTE — Progress Notes (Signed)
Called patient to advise her that Dr. Wyline Mood wanted to start her on Norvasc 5 mg daily and for her to follow up as a nurse visit in 2 weeks. Scheduled appointment. Patient verbalized understanding.

## 2023-07-06 NOTE — Addendum Note (Signed)
Addended by: Carmelina Paddock on: 07/06/2023 09:23 AM   Modules accepted: Orders

## 2023-07-20 ENCOUNTER — Telehealth: Payer: Self-pay | Admitting: *Deleted

## 2023-07-20 ENCOUNTER — Ambulatory Visit: Payer: 59 | Attending: Cardiology | Admitting: *Deleted

## 2023-07-20 ENCOUNTER — Encounter: Payer: Self-pay | Admitting: *Deleted

## 2023-07-20 VITALS — BP 138/64 | HR 68 | Ht 65.0 in | Wt 238.8 lb

## 2023-07-20 DIAGNOSIS — I10 Essential (primary) hypertension: Secondary | ICD-10-CM | POA: Diagnosis not present

## 2023-07-20 MED ORDER — AMLODIPINE BESYLATE 10 MG PO TABS: 10.0000 mg | ORAL_TABLET | Freq: Every day | ORAL | 3 refills | Status: DC

## 2023-07-20 NOTE — Patient Instructions (Signed)
Your physician recommends that you continue on your current medications as directed. Please refer to the Current Medication list given to you today.  We will contact you with any changes after your provider reviews your visit

## 2023-07-20 NOTE — Telephone Encounter (Signed)
Patient informed and verbalized understanding of plan. 

## 2023-07-20 NOTE — Telephone Encounter (Signed)
Author: Antoine Poche, MD Service: Cardiology Author Type: Physician  Filed: 07/20/2023 11:10 AM Encounter Date: 07/20/2023 Status: Signed  Editor: Antoine Poche, MD (Physician)   BP improved but still above goal, please increase norvasc to 10mg  daily   Dominga Ferry MD

## 2023-07-20 NOTE — Telephone Encounter (Signed)
-----   Message from Dina Rich sent at 07/20/2023 11:10 AM EDT -----    ----- Message ----- From: Eustace Moore, RN Sent: 07/20/2023   8:48 AM EDT To: Antoine Poche, MD

## 2023-07-20 NOTE — Progress Notes (Signed)
BP improved but still above goal, please increase norvasc to 10mg  daily  Dominga Ferry MD

## 2023-07-20 NOTE — Progress Notes (Signed)
Presents to office per last nurse visit and requested by Dr. Wyline Mood: please start norvasc 5mg  daily and repeat nursing visit with vitals check in 2 weeks. Medications reviewed. Reports taking all medications as prescribed without side effects. Denies dizziness, chest pain or SOB. Vitals done and sent to provider for review.

## 2023-07-22 ENCOUNTER — Other Ambulatory Visit: Payer: Self-pay | Admitting: *Deleted

## 2023-07-22 MED ORDER — AMLODIPINE BESYLATE 10 MG PO TABS: 10.0000 mg | ORAL_TABLET | Freq: Every day | ORAL | 1 refills | Status: DC

## 2023-08-02 DIAGNOSIS — R5383 Other fatigue: Secondary | ICD-10-CM | POA: Diagnosis not present

## 2023-08-02 DIAGNOSIS — Z299 Encounter for prophylactic measures, unspecified: Secondary | ICD-10-CM | POA: Diagnosis not present

## 2023-08-02 DIAGNOSIS — I5032 Chronic diastolic (congestive) heart failure: Secondary | ICD-10-CM | POA: Diagnosis not present

## 2023-08-02 DIAGNOSIS — Z0000 Encounter for general adult medical examination without abnormal findings: Secondary | ICD-10-CM | POA: Diagnosis not present

## 2023-08-02 DIAGNOSIS — E7800 Pure hypercholesterolemia, unspecified: Secondary | ICD-10-CM | POA: Diagnosis not present

## 2023-08-02 DIAGNOSIS — J449 Chronic obstructive pulmonary disease, unspecified: Secondary | ICD-10-CM | POA: Diagnosis not present

## 2023-08-02 DIAGNOSIS — I10 Essential (primary) hypertension: Secondary | ICD-10-CM | POA: Diagnosis not present

## 2023-08-02 DIAGNOSIS — Z79899 Other long term (current) drug therapy: Secondary | ICD-10-CM | POA: Diagnosis not present

## 2023-08-03 DIAGNOSIS — J441 Chronic obstructive pulmonary disease with (acute) exacerbation: Secondary | ICD-10-CM | POA: Diagnosis not present

## 2023-08-03 DIAGNOSIS — I5022 Chronic systolic (congestive) heart failure: Secondary | ICD-10-CM | POA: Diagnosis not present

## 2023-08-05 DIAGNOSIS — I10 Essential (primary) hypertension: Secondary | ICD-10-CM | POA: Diagnosis not present

## 2023-08-05 DIAGNOSIS — Z299 Encounter for prophylactic measures, unspecified: Secondary | ICD-10-CM | POA: Diagnosis not present

## 2023-08-05 DIAGNOSIS — M542 Cervicalgia: Secondary | ICD-10-CM | POA: Diagnosis not present

## 2023-08-09 ENCOUNTER — Encounter: Payer: Self-pay | Admitting: Pulmonary Disease

## 2023-08-22 DIAGNOSIS — E119 Type 2 diabetes mellitus without complications: Secondary | ICD-10-CM | POA: Diagnosis not present

## 2023-09-03 DIAGNOSIS — J441 Chronic obstructive pulmonary disease with (acute) exacerbation: Secondary | ICD-10-CM | POA: Diagnosis not present

## 2023-09-03 DIAGNOSIS — I5022 Chronic systolic (congestive) heart failure: Secondary | ICD-10-CM | POA: Diagnosis not present

## 2023-09-07 DIAGNOSIS — I152 Hypertension secondary to endocrine disorders: Secondary | ICD-10-CM | POA: Diagnosis not present

## 2023-09-07 DIAGNOSIS — G4700 Insomnia, unspecified: Secondary | ICD-10-CM | POA: Diagnosis not present

## 2023-09-07 DIAGNOSIS — E1159 Type 2 diabetes mellitus with other circulatory complications: Secondary | ICD-10-CM | POA: Diagnosis not present

## 2023-09-07 DIAGNOSIS — Z23 Encounter for immunization: Secondary | ICD-10-CM | POA: Diagnosis not present

## 2023-09-07 DIAGNOSIS — Z299 Encounter for prophylactic measures, unspecified: Secondary | ICD-10-CM | POA: Diagnosis not present

## 2023-09-07 DIAGNOSIS — I10 Essential (primary) hypertension: Secondary | ICD-10-CM | POA: Diagnosis not present

## 2023-09-07 DIAGNOSIS — I5032 Chronic diastolic (congestive) heart failure: Secondary | ICD-10-CM | POA: Diagnosis not present

## 2023-10-03 DIAGNOSIS — J441 Chronic obstructive pulmonary disease with (acute) exacerbation: Secondary | ICD-10-CM | POA: Diagnosis not present

## 2023-10-03 DIAGNOSIS — I5022 Chronic systolic (congestive) heart failure: Secondary | ICD-10-CM | POA: Diagnosis not present

## 2023-10-10 ENCOUNTER — Other Ambulatory Visit: Payer: Self-pay | Admitting: Cardiology

## 2023-10-10 ENCOUNTER — Other Ambulatory Visit: Payer: Self-pay | Admitting: Nurse Practitioner

## 2023-10-10 DIAGNOSIS — Z1231 Encounter for screening mammogram for malignant neoplasm of breast: Secondary | ICD-10-CM

## 2023-10-18 ENCOUNTER — Ambulatory Visit
Admission: RE | Admit: 2023-10-18 | Discharge: 2023-10-18 | Disposition: A | Discharge status: Home / Self Care | Payer: 59 | Source: Ambulatory Visit | Attending: Nurse Practitioner | Admitting: Nurse Practitioner

## 2023-10-18 DIAGNOSIS — Z1231 Encounter for screening mammogram for malignant neoplasm of breast: Secondary | ICD-10-CM

## 2023-11-03 DIAGNOSIS — I5022 Chronic systolic (congestive) heart failure: Secondary | ICD-10-CM | POA: Diagnosis not present

## 2023-11-03 DIAGNOSIS — J441 Chronic obstructive pulmonary disease with (acute) exacerbation: Secondary | ICD-10-CM | POA: Diagnosis not present

## 2023-11-07 DIAGNOSIS — J9611 Chronic respiratory failure with hypoxia: Secondary | ICD-10-CM | POA: Diagnosis not present

## 2023-11-07 DIAGNOSIS — J449 Chronic obstructive pulmonary disease, unspecified: Secondary | ICD-10-CM | POA: Diagnosis not present

## 2023-11-07 DIAGNOSIS — Z299 Encounter for prophylactic measures, unspecified: Secondary | ICD-10-CM | POA: Diagnosis not present

## 2023-11-07 DIAGNOSIS — U071 COVID-19: Secondary | ICD-10-CM | POA: Diagnosis not present

## 2023-11-07 DIAGNOSIS — R52 Pain, unspecified: Secondary | ICD-10-CM | POA: Diagnosis not present

## 2023-11-07 DIAGNOSIS — I4891 Unspecified atrial fibrillation: Secondary | ICD-10-CM | POA: Diagnosis not present

## 2023-12-03 DIAGNOSIS — J441 Chronic obstructive pulmonary disease with (acute) exacerbation: Secondary | ICD-10-CM | POA: Diagnosis not present

## 2023-12-03 DIAGNOSIS — I5022 Chronic systolic (congestive) heart failure: Secondary | ICD-10-CM | POA: Diagnosis not present

## 2023-12-30 ENCOUNTER — Ambulatory Visit: Payer: 59 | Admitting: Cardiology

## 2023-12-30 NOTE — Progress Notes (Deleted)
Clinical Summary Ms. Gilkey is a 73 y.o.female  seen today for follow up of the following medical problems.      1. History of cardiomyopathy - admit Jan 2018 with CHF - echo at that time showed LVEF 35-40%, a new finding for the patient. - referred for cath. Feb 2018 cath: distal LAD 100%, Prox RCA 40%, OM3 40%. RHC mean PA 37, PCWP 26, CI 1.73. CAD medically managed.    - repeat echo 07/2017: LVEF 50-55%, abnormal diastolic function  05/2021 echo: LVEF 60-65%< no WMAs, indet diastolic 10/2021 nuclear stress no ischemia.        - no recent edema. Chronic stable SOB. Has lost about 20 lbs since last year - no longer requires a diuretic.     2.CAD  . No chest pains -  referred for cath. Feb 2018 cath: distal LAD 100%, Prox RCA 40%, OM3 40%. RHC mean PA 37, PCWP 26, CI 1.73. CAD medically managed. -10/2021 nuclear stress no ischemia.       3.SOB - seen by Dr Antoine Poche 05/2021 in clinic for SOB - CXR no acute process - had not been taking her lasix at the time, was to take more regularly.    From last pulm note:  "No worse off trelegy though she clearly has an asthmatic component so advised to restart immediately for any flare as her main problem is restrictive, not obstructive, but may  be prone to exac and needs to keep saba handy in case needs it for rescue".   10/2021 nuclear stress no ischemia.   - chronic stable symptoms.        3. COPD - 03/2017 PFTs: severe COPD - followed by Dr Sherene Sires         4. Afib - no recent palpitaitions - no bleeding on eliquis.      5. Hyperlipidemia - reports nonspecific neck and shoulder pains, unclear if related to statin.  - somewhat improved with lower statin dosing - 07/2017 TC 131 TG 108 HDL 48 LDL 61 - atorvastatin recently increased to 40mg  daily. Since that time has had some stomach upset for few week, constiptation alternating with diarrhea.   - tolerating atorva 40mg  daily - reports most recent labs done by pcp    06/2022 TC 134 TG 134 HDL 53 LDL 58 - upcoming labs with pcp     6. DM2 - followed by pcp     7. OSA - sleep study 03/2017 with severe OSA. On bipap - has not followed with sleep medicine in several years    ?if got referral to pulm   Past Medical History:  Diagnosis Date   Acid reflux    Hypertension    Sleep apnea      No Known Allergies   Current Outpatient Medications  Medication Sig Dispense Refill   amLODipine (NORVASC) 10 MG tablet Take 1 tablet (10 mg total) by mouth daily. 90 tablet 1   atorvastatin (LIPITOR) 40 MG tablet TAKE ONE TABLET BY MOUTH DAILY 90 tablet 1   bisoprolol (ZEBETA) 5 MG tablet TAKE ONE TABLET BY MOUTH DAILY 90 tablet 1   Budeson-Glycopyrrol-Formoterol (BREZTRI AEROSPHERE) 160-9-4.8 MCG/ACT AERO Take 2 puffs first thing in am and then another 2 puffs about 12 hours later. 10.7 g 11   ELIQUIS 5 MG TABS tablet TAKE ONE TABLET BY MOUTH TWICE DAILY 60 tablet 5   glimepiride (AMARYL) 2 MG tablet Take 2 mg by mouth daily.  No current facility-administered medications for this visit.     Past Surgical History:  Procedure Laterality Date   BIOPSY  09/13/2017   Procedure: BIOPSY;  Surgeon: West Bali, MD;  Location: [facility];  Service: Endoscopy;;  colon duodenum gastric   BREAST BIOPSY Right    normal result   CESAREAN SECTION     CHOLECYSTECTOMY  11/06/2012   Procedure: LAPAROSCOPIC CHOLECYSTECTOMY WITH INTRAOPERATIVE CHOLANGIOGRAM;  Surgeon: Valarie Merino, MD;  Location: [facility];  Service: General;  Laterality: N/A;   COLONOSCOPY N/A 09/13/2017   Procedure: COLONOSCOPY;  Surgeon: West Bali, MD;  Location: [facility];  Service: Endoscopy;  Laterality: N/A;  245   ESOPHAGOGASTRODUODENOSCOPY N/A 09/13/2017   Procedure: ESOPHAGOGASTRODUODENOSCOPY (EGD);  Surgeon: West Bali, MD;  Location: [facility];  Service: Endoscopy;  Laterality: N/A;   POLYPECTOMY  09/13/2017   Procedure: POLYPECTOMY;  Surgeon: West Bali,  MD;  Location: [facility];  Service: Endoscopy;;  colon   RIGHT/LEFT HEART CATH AND CORONARY ANGIOGRAPHY N/A 01/10/2017   Procedure: Right/Left Heart Cath and Coronary Angiography;  Surgeon: Lyn Records, MD;  Location: [facility];  Service: Cardiovascular;  Laterality: N/A;   UMBILICAL HERNIA REPAIR  11/06/2012   Procedure: HERNIA REPAIR UMBILICAL ADULT;  Surgeon: Valarie Merino, MD;  Location: [facility];  Service: General;;     No Known Allergies    Family History  Problem Relation Age of Onset   Heart disease Mother 56   Heart attack Mother    Asthma Father    Cancer Father        pancreastic cancer   Breast cancer Sister 28   Cancer Sister        unsure   Heart attack Brother 45   Colon cancer Neg Hx      Social History Ms. Levins reports that she has never smoked. She has never used smokeless tobacco. Ms. Janoski reports no history of alcohol use.   Review of Systems CONSTITUTIONAL: No weight loss, fever, chills, weakness or fatigue.  HEENT: Eyes: No visual loss, blurred vision, double vision or yellow sclerae.No hearing loss, sneezing, congestion, runny nose or sore throat.  SKIN: No rash or itching.  CARDIOVASCULAR:  RESPIRATORY: No shortness of breath, cough or sputum.  GASTROINTESTINAL: No anorexia, nausea, vomiting or diarrhea. No abdominal pain or blood.  GENITOURINARY: No burning on urination, no polyuria NEUROLOGICAL: No headache, dizziness, syncope, paralysis, ataxia, numbness or tingling in the extremities. No change in bowel or bladder control.  MUSCULOSKELETAL: No muscle, back pain, joint pain or stiffness.  LYMPHATICS: No enlarged nodes. No history of splenectomy.  PSYCHIATRIC: No history of depression or anxiety.  ENDOCRINOLOGIC: No reports of sweating, cold or heat intolerance. No polyuria or polydipsia.  [name]   Physical Examination There were no vitals filed for this visit. There were no vitals filed for this visit.  Gen: resting  comfortably, no acute distress HEENT: no scleral icterus, pupils equal round and reactive, no palptable cervical adenopathy,  CV Resp: Clear to auscultation bilaterally GI: abdomen is soft, non-tender, non-distended, normal bowel sounds, no hepatosplenomegaly MSK: extremities are warm, no edema.  Skin: warm, no rash Neuro:  no focal deficits Psych: appropriate affect   Diagnostic Studies  01/2017 cath Dist LAD lesion, 100 %stenosed. Prox RCA lesion, 40 %stenosed. 3rd Mrg lesion, 40 %stenosed. There is severe left ventricular systolic dysfunction. The left ventricular ejection fraction is less than 25% by visual estimate. LV end  diastolic pressure is severely elevated. Hemodynamic findings consistent with moderate pulmonary hypertension.   Acute atrial fibrillation developed on the cath table during right heart/right atrial pressure recording. Rate 118 bpm. Atrial fibrillation was treated with one bolus of IV amiodarone. 40% proximal RCA stenosis. Tortuous but normal circumflex 100% distal/apical LAD stenosis. This lesion is probably chronic but difficult to determine age. There is also a possibility that it is acute embolic occlusion from a catheter-based thrombus. For that reason additional heparin and then Aggrastat bolus was given as the patient complained of chest pressure post procedure. Chest pressure post procedure is similar to prior episodes. Severe LV dysfunction with EF less than 25%. Contraction pattern appears global. Arterial blood gas demonstrated severe CO2 retention, pCO2 67.       01/2017 echo Study Conclusions   - Left ventricle: The cavity size was mildly dilated. Wall   thickness was increased in a pattern of moderate LVH. Systolic   function was moderately reduced. The estimated ejection fraction   was in the range of 35% to 40%. Diffuse hypokinesis. - Aortic valve: Mildly calcified annulus. Trileaflet; mildly   thickened leaflets. - Mitral valve: There  was mild regurgitation. - Left atrium: The atrium was moderately dilated. - Atrial septum: The septum bowed from left to right, consistent   with increased left atrial pressure. - Pulmonic valve: There was mild regurgitation.     07/2017 echo Study Conclusions   - Left ventricle: The cavity size was normal. Wall thickness was   increased increased in a pattern of mild to moderate LVH.   Systolic function was normal. The estimated ejection fraction was   in the range of 50% to 55%. Diastolic function is abnormal,   indeterminant grade. LVOT VTI and Vmax likely underestimated   based on angle of acquisition. - Aortic valve: Valve area (VTI): 1.56 cm^2. Valve area (Vmax):   1.84 cm^2. - Technically difficult study.     Assessment and Plan   1.History of cardiomyopathy - LVEF has normalized, euvolemic no longer reqiring diuretic - continue current meds   2. Elevated blood pressure - bp elevated today, isolated high bp. Will come back for nursing visit for recheck, if elevated would likely start norvasc.    3. Hyperlipidemia -at goal, continue current meds   4. Afib/acquired thrombophilia -denies symptoms, continue current meds. She is on eliquis for stroke prevention - EKG shows normal sinus rhytm   5. OSA - on bipap at home. Several years since sleep evaluation, refer to pulmonary       Antoine Poche, M.D., F.A.C.C.

## 2024-01-03 DIAGNOSIS — I5022 Chronic systolic (congestive) heart failure: Secondary | ICD-10-CM | POA: Diagnosis not present

## 2024-01-03 DIAGNOSIS — J441 Chronic obstructive pulmonary disease with (acute) exacerbation: Secondary | ICD-10-CM | POA: Diagnosis not present

## 2024-01-09 DIAGNOSIS — I4891 Unspecified atrial fibrillation: Secondary | ICD-10-CM | POA: Diagnosis not present

## 2024-01-09 DIAGNOSIS — M5416 Radiculopathy, lumbar region: Secondary | ICD-10-CM | POA: Diagnosis not present

## 2024-01-09 DIAGNOSIS — M545 Low back pain, unspecified: Secondary | ICD-10-CM | POA: Diagnosis not present

## 2024-01-09 DIAGNOSIS — Z299 Encounter for prophylactic measures, unspecified: Secondary | ICD-10-CM | POA: Diagnosis not present

## 2024-01-09 DIAGNOSIS — I10 Essential (primary) hypertension: Secondary | ICD-10-CM | POA: Diagnosis not present

## 2024-01-11 DIAGNOSIS — M48061 Spinal stenosis, lumbar region without neurogenic claudication: Secondary | ICD-10-CM | POA: Diagnosis not present

## 2024-01-11 DIAGNOSIS — M4807 Spinal stenosis, lumbosacral region: Secondary | ICD-10-CM | POA: Diagnosis not present

## 2024-01-11 DIAGNOSIS — M47816 Spondylosis without myelopathy or radiculopathy, lumbar region: Secondary | ICD-10-CM | POA: Diagnosis not present

## 2024-01-11 DIAGNOSIS — M5126 Other intervertebral disc displacement, lumbar region: Secondary | ICD-10-CM | POA: Diagnosis not present

## 2024-01-17 DIAGNOSIS — J9611 Chronic respiratory failure with hypoxia: Secondary | ICD-10-CM | POA: Diagnosis not present

## 2024-01-17 DIAGNOSIS — M48061 Spinal stenosis, lumbar region without neurogenic claudication: Secondary | ICD-10-CM | POA: Diagnosis not present

## 2024-01-17 DIAGNOSIS — J449 Chronic obstructive pulmonary disease, unspecified: Secondary | ICD-10-CM | POA: Diagnosis not present

## 2024-01-17 DIAGNOSIS — I10 Essential (primary) hypertension: Secondary | ICD-10-CM | POA: Diagnosis not present

## 2024-01-17 DIAGNOSIS — Z299 Encounter for prophylactic measures, unspecified: Secondary | ICD-10-CM | POA: Diagnosis not present

## 2024-01-17 DIAGNOSIS — E1165 Type 2 diabetes mellitus with hyperglycemia: Secondary | ICD-10-CM | POA: Diagnosis not present

## 2024-01-18 DIAGNOSIS — M48062 Spinal stenosis, lumbar region with neurogenic claudication: Secondary | ICD-10-CM | POA: Diagnosis not present

## 2024-01-30 ENCOUNTER — Telehealth: Payer: Self-pay | Admitting: Cardiology

## 2024-01-30 NOTE — Telephone Encounter (Addendum)
   Name: Pamela Soto  DOB: Feb 23, 1951  MRN: [id number]  Primary Cardiologist: Dina Rich, MD  Chart reviewed as part of pre-operative protocol coverage. The patient has an upcoming visit scheduled with Sharlene Dory, NP on 02/10/2024 at which time clearance can be addressed in case there are any issues that would impact surgical recommendations.  I added preop FYI to appointment note so that provider is aware to address at time of outpatient visit.  Per office protocol the cardiology provider should forward their finalized clearance decision and recommendations regarding antiplatelet therapy to the requesting party below.     I will route this message as FYI to requesting party and remove this message from the preop box as separate preop APP input not needed at this time.   Please call with any questions.  Napoleon Form, Leodis Rains, NP  01/30/2024, 4:25 PM

## 2024-01-30 NOTE — Telephone Encounter (Signed)
 Pharmacy please advise on holding Eliquis prior to lumbar and transforaminal ESI scheduled for TBD. Thank you.

## 2024-01-30 NOTE — Telephone Encounter (Signed)
   Pre-operative Risk Assessment    Patient Name: Pamela Soto  DOB: 02/12/1951 MRN: [id number]      Request for Surgical Clearance    Procedure:   bilateral - L4 - L5 TFES1  Date of Surgery:  Clearance TBD                                 Surgeon:  Not indicated Surgeon's Group or Practice Name:  [facility] Phone number:  [phone] Fax number:  [phone]   Type of Clearance Requested:   - Medical  - Pharmacy:  Hold Apixaban (Eliquis) Discontinue for 3 days prior. Resume day after.   Type of Anesthesia:  Not Indicated   Additional requests/questions:    Signed, Seymour Bars   01/30/2024, 4:06 PM

## 2024-01-31 NOTE — Telephone Encounter (Signed)
 Patient with diagnosis of afib on Eliquis for anticoagulation.    Procedure: bilateral - L4 - L5 TFES1  Date of procedure: TBD   CHA2DS2-VASc Score = 6   This indicates a 9.7% annual risk of stroke. The patient's score is based upon: CHF History: 1 HTN History: 1 Diabetes History: 1 Stroke History: 0 Vascular Disease History: 1 Age Score: 1 Gender Score: 1   CrCl 79 mL/min Platelet count 208 K    Per office protocol, patient can hold Eliquis for 3 days prior to procedure.     **This guidance is not considered finalized until pre-operative APP has relayed final recommendations.**

## 2024-02-03 DIAGNOSIS — I5022 Chronic systolic (congestive) heart failure: Secondary | ICD-10-CM | POA: Diagnosis not present

## 2024-02-03 DIAGNOSIS — J441 Chronic obstructive pulmonary disease with (acute) exacerbation: Secondary | ICD-10-CM | POA: Diagnosis not present

## 2024-02-06 ENCOUNTER — Telehealth: Payer: Self-pay | Admitting: Cardiology

## 2024-02-06 NOTE — Telephone Encounter (Addendum)
   Pre-operative Risk Assessment    Patient Name: Pamela Soto  DOB: May 29, 1951 MRN: [id number]      Request for Surgical Clearance    Procedure:   Bilateral L4-L5 TFESI  Date of Surgery:  Clearance TBD                                 Surgeon:  Aileen Fass Surgeon's Group or Practice Name:  [facility] Phone number:  [phone] Fax number:  [phone]   Type of Clearance Requested:   - Pharmacy:  Hold Apixaban (Eliquis) 3 days prior    Type of Anesthesia:  Not Indicated   Additional requests/questions:  Please fax a copy of clearance  to the surgeon's office.  Per Fax  this is a STAT request.  Signed, Delaine Lame   02/06/2024, 2:07 PM

## 2024-02-06 NOTE — Telephone Encounter (Signed)
    Name: Pamela Soto  DOB: Aug 15, 1951  MRN: [id number]   Primary Cardiologist: Dina Rich, MD   Chart reviewed as part of pre-operative protocol coverage. There is an existing request from surgeon for preop evaluation and patient has an upcoming visit scheduled with Sharlene Dory, NP on 02/10/2024 at which time clearance can be addressed in case there are any issues that would impact surgical recommendations.  I added preop FYI to appointment note so that provider is aware to address at time of outpatient visit.  Per office protocol the cardiology provider should forward their finalized clearance decision and recommendations regarding antiplatelet therapy to the requesting party below.       I will route this message as FYI to requesting party and remove this message from the preop box as separate preop APP input not needed at this time.     Perlie Gold, PA-C

## 2024-02-10 ENCOUNTER — Ambulatory Visit: Payer: 59 | Attending: Nurse Practitioner | Admitting: Nurse Practitioner

## 2024-02-10 ENCOUNTER — Encounter: Payer: Self-pay | Admitting: Nurse Practitioner

## 2024-02-10 VITALS — BP 119/57 | Ht 65.0 in | Wt 231.0 lb

## 2024-02-10 DIAGNOSIS — I480 Paroxysmal atrial fibrillation: Secondary | ICD-10-CM | POA: Diagnosis not present

## 2024-02-10 DIAGNOSIS — G8929 Other chronic pain: Secondary | ICD-10-CM | POA: Diagnosis not present

## 2024-02-10 DIAGNOSIS — E785 Hyperlipidemia, unspecified: Secondary | ICD-10-CM | POA: Diagnosis not present

## 2024-02-10 DIAGNOSIS — Z01810 Encounter for preprocedural cardiovascular examination: Secondary | ICD-10-CM | POA: Diagnosis not present

## 2024-02-10 DIAGNOSIS — I959 Hypotension, unspecified: Secondary | ICD-10-CM | POA: Diagnosis not present

## 2024-02-10 DIAGNOSIS — M549 Dorsalgia, unspecified: Secondary | ICD-10-CM | POA: Diagnosis not present

## 2024-02-10 DIAGNOSIS — I5032 Chronic diastolic (congestive) heart failure: Secondary | ICD-10-CM | POA: Diagnosis not present

## 2024-02-10 DIAGNOSIS — I2510 Atherosclerotic heart disease of native coronary artery without angina pectoris: Secondary | ICD-10-CM | POA: Diagnosis not present

## 2024-02-10 MED ORDER — OMRON 3 SERIES BP MONITOR DEVI: 1.0000 | 0 refills | Status: AC

## 2024-02-10 NOTE — Patient Instructions (Addendum)
 Medication Instructions:  Your physician recommends that you continue on your current medications as directed. Please refer to the Current Medication list given to you today.  Labwork: none  Testing/Procedures: none  Follow-Up: Your physician recommends that you schedule a follow-up appointment in: 6 months with Dr. Wyline Mood  Any Other Special Instructions Will Be Listed Below (If Applicable). Omron blood pressure cuff prescription sent to your pharmacy. Your physician has requested that you regularly monitor and record your blood pressure readings at home. Please use the same machine at the same time of day to check your readings and record them to bring to your follow-up visit.  If you need a refill on your cardiac medications before your next appointment, please call your pharmacy.

## 2024-02-10 NOTE — Progress Notes (Signed)
 Cardiology Office Note:  .   Date:  02/10/2024 ID:  Pamela Soto, DOB 1951/02/22, MRN [id number] PCP: [facility] Providers Cardiologist:  Dina Rich, MD    History of Present Illness: .   Pamela Soto is a 73 y.o. female with a PMH of CAD, hx of cardiomyopathy, A-fib, HLD, T2DM, shortness of breath, COPD, and OSA, who presents today for pre-operative cardiovascular risk assessment and follow-up.   Last seen by Dr. Dina Rich on June 21, 2023. Was overall doing well at the time.   Today she presents for follow-up. She is pending bialteral L4-L5 TFES1 clearance. Surgeon's group is [facility].  Our office has been asked to see if patient can hold Eliquis for 3 days prior to procedure and resume day after.  She presents today for follow-up.  She admits to significant back pain, this is her chief concern today. Denies any chest pain, shortness of breath, palpitations, syncope, presyncope, dizziness, orthopnea, PND, swelling or significant weight changes, acute bleeding, or claudication.  ROS: Negative. See HPI.   Studies Reviewed: [name]    EKG:  EKG Interpretation Date/Time:  Friday February 10 2024 14:11:15 EST Ventricular Rate:  62 PR Interval:  226 QRS Duration:  94 QT Interval:  456 QTC Calculation: 462 R Axis:   59  Text Interpretation: Sinus rhythm with 1st degree A-V block When compared with ECG of 12-Jan-2017 08:44, PR interval has increased Incomplete left bundle branch block is no longer Present Confirmed by Sharlene Dory [phone]) on 02/10/2024 2:16:50 PM    Lexiscan 10/2021:    The study is normal. The study is low risk.   No ST deviation was noted.   There is a moderate size mild intensity anterior defect most intense in the resting images with normal wall motion, consistent with breast attenuation.   Left ventricular function is normal. Nuclear stress EF: 62 %. The left ventricular ejection fraction is normal  (55-65%). End diastolic cavity size is normal.   Echo 05/2021:  1. Left ventricular ejection fraction, by estimation, is 60 to 65%. The  left ventricle has normal function. The left ventricle has no regional  wall motion abnormalities. There is moderate left ventricular hypertrophy.  Left ventricular diastolic  parameters are indeterminate. The average left ventricular global  longitudinal strain is -17.2 %. The global longitudinal strain is normal.   2. Right ventricular systolic function is normal. The right ventricular  size is normal.   3. Left atrial size was mildly dilated.   4. The mitral valve is normal in structure. No evidence of mitral valve  regurgitation. No evidence of mitral stenosis.   5. The aortic valve was not well visualized. Aortic valve regurgitation  is not visualized. No aortic stenosis is present.   6. The inferior vena cava is normal in size with greater than 50%  respiratory variability, suggesting right atrial pressure of 3 mmHg.   Comparison(s): Echocardiogram done 07/06/17 showed an EF of 50-55%.   Right/left heart cath 01/2017: Dist LAD lesion, 100 %stenosed. Prox RCA lesion, 40 %stenosed. 3rd Mrg lesion, 40 %stenosed. There is severe left ventricular systolic dysfunction. The left ventricular ejection fraction is less than 25% by visual estimate. LV end diastolic pressure is severely elevated. Hemodynamic findings consistent with moderate pulmonary hypertension.   Acute atrial fibrillation developed on the cath table during right heart/right atrial pressure recording. Rate 118 bpm. Atrial fibrillation was treated with one bolus of IV amiodarone. 40% proximal  RCA stenosis. Tortuous but normal circumflex 100% distal/apical LAD stenosis. This lesion is probably chronic but difficult to determine age. There is also a possibility that it is acute embolic occlusion from a catheter-based thrombus. For that reason additional heparin and then Aggrastat bolus was  given as the patient complained of chest pressure post procedure. Chest pressure post procedure is similar to prior episodes. Severe LV dysfunction with EF less than 25%. Contraction pattern appears global. Arterial blood gas demonstrated severe CO2 retention, pCO2 67.   RECOMMENDATIONS: Stay [facility]. Stepdown bed.. IV heparin. Cycle cardiac markers IV amiodarone to convert atrial fibrillation. Consider advanced heart failure team evaluation. Discuss with accepting team.  Physical Exam:   VS:  BP (!) 119/57 (BP Location: Left Arm, Patient Position: Sitting, Cuff Size: Large)   Ht 5\' 5"  (1.651 m)   Wt 231 lb (104.8 kg)   SpO2 95%   BMI 38.44 kg/m    Wt Readings from Last 3 Encounters:  02/10/24 231 lb (104.8 kg)  07/20/23 238 lb 12.8 oz (108.3 kg)  07/05/23 240 lb 6.4 oz (109 kg)    GEN: Obese, 73 y.o. female in no acute distress NECK: No JVD; No carotid bruits CARDIAC: S1/S2, RRR, no murmurs, rubs, gallops RESPIRATORY:  Clear to auscultation without rales, wheezing or rhonchi  ABDOMEN: Soft, non-tender, non-distended EXTREMITIES:  No edema; No deformity   ASSESSMENT AND PLAN: .    Preoperative cardiovascular risk assessment Ms. Licklider's perioperative risk of a major cardiac event is 6.6% according to the Revised Cardiac Risk Index (RCRI).  Therefore, she is at high risk for perioperative complications.   Her functional capacity is good at 5.07 METs according to the Duke Activity Status Index (DASI). Recommendations: According to ACC/AHA guidelines, no further cardiovascular testing needed.  The patient may proceed to surgery at acceptable risk.   Antiplatelet and/or Anticoagulation Recommendations: Per office protocol, patient can hold Eliquis for 3 days prior to procedure.  Will route note to requesting party.  2. CAD See previous cardiac catheterization from 2018 report noted above. Stable with no anginal symptoms. No indication for ischemic evaluation.  She  is not on aspirin due to being on Eliquis.  Continue atorvastatin and bisoprolol. Heart healthy diet and regular cardiovascular exercise as tolerated encouraged.   3. HFimpEF Stage C, NYHA class I-2 symptoms.  EF in 2022 was found to be 60 to 65%. Euvolemic and well compensated on exam.  Continue current medication regimen. Low sodium diet, fluid restriction <2L, and daily weights encouraged. Educated to contact our office for weight gain of 2 lbs overnight or 5 lbs in one week. Heart healthy diet and regular cardiovascular exercise as tolerated encouraged.   4. PAF Denies any tachycardia palpitations.  Heart rate is well-controlled today.  She is in sinus rhythm on EKG.  Continue bisoprolol.  Continue Eliquis for stroke prevention.  She is on appropriate dosage and denies any bleeding issues.  5. HLD LDL 52 in August 2024.  Continue atorvastatin. Heart healthy diet and regular cardiovascular exercise encouraged.   6. Hypotension Initial blood pressure on arrival 98/56, she is asymptomatic with this.  Recheck BP on exam 119/57.  She does not check her BP at home.  Will provide an Rx for BP cuff. Discussed to monitor BP at home at least 2 hours after medications and sitting for 5-10 minutes. Care and ED precautions discussed.  7. Back pain Chronic, stable. Continue to follow-up with PCP and medical team managing this.  Dispo: Follow-up with Dr. Dina Rich or APP in 6 months or sooner if anything changes.  Signed, Sharlene Dory, NP

## 2024-02-15 NOTE — Telephone Encounter (Signed)
 We have received 3 request for this Clearance. Today 02/15/24 being the 3rd request.

## 2024-02-15 NOTE — Telephone Encounter (Signed)
 I will re-fax clearance notes from Sharlene Dory, NP 02/10/24.

## 2024-02-27 DIAGNOSIS — M48062 Spinal stenosis, lumbar region with neurogenic claudication: Secondary | ICD-10-CM | POA: Diagnosis not present

## 2024-03-02 DIAGNOSIS — M48062 Spinal stenosis, lumbar region with neurogenic claudication: Secondary | ICD-10-CM | POA: Diagnosis not present

## 2024-03-02 DIAGNOSIS — J441 Chronic obstructive pulmonary disease with (acute) exacerbation: Secondary | ICD-10-CM | POA: Diagnosis not present

## 2024-03-02 DIAGNOSIS — I5022 Chronic systolic (congestive) heart failure: Secondary | ICD-10-CM | POA: Diagnosis not present

## 2024-03-06 DIAGNOSIS — Z0000 Encounter for general adult medical examination without abnormal findings: Secondary | ICD-10-CM | POA: Diagnosis not present

## 2024-03-06 DIAGNOSIS — J9611 Chronic respiratory failure with hypoxia: Secondary | ICD-10-CM | POA: Diagnosis not present

## 2024-03-06 DIAGNOSIS — Z299 Encounter for prophylactic measures, unspecified: Secondary | ICD-10-CM | POA: Diagnosis not present

## 2024-03-06 DIAGNOSIS — Z7189 Other specified counseling: Secondary | ICD-10-CM | POA: Diagnosis not present

## 2024-03-06 DIAGNOSIS — I10 Essential (primary) hypertension: Secondary | ICD-10-CM | POA: Diagnosis not present

## 2024-03-06 DIAGNOSIS — I4891 Unspecified atrial fibrillation: Secondary | ICD-10-CM | POA: Diagnosis not present

## 2024-03-06 DIAGNOSIS — Z713 Dietary counseling and surveillance: Secondary | ICD-10-CM | POA: Diagnosis not present

## 2024-03-06 DIAGNOSIS — J441 Chronic obstructive pulmonary disease with (acute) exacerbation: Secondary | ICD-10-CM | POA: Diagnosis not present

## 2024-03-07 DIAGNOSIS — M48062 Spinal stenosis, lumbar region with neurogenic claudication: Secondary | ICD-10-CM | POA: Diagnosis not present

## 2024-03-13 DIAGNOSIS — M48062 Spinal stenosis, lumbar region with neurogenic claudication: Secondary | ICD-10-CM | POA: Diagnosis not present

## 2024-03-21 DIAGNOSIS — M48062 Spinal stenosis, lumbar region with neurogenic claudication: Secondary | ICD-10-CM | POA: Diagnosis not present

## 2024-03-29 DIAGNOSIS — M48062 Spinal stenosis, lumbar region with neurogenic claudication: Secondary | ICD-10-CM | POA: Diagnosis not present

## 2024-04-02 DIAGNOSIS — J441 Chronic obstructive pulmonary disease with (acute) exacerbation: Secondary | ICD-10-CM | POA: Diagnosis not present

## 2024-04-02 DIAGNOSIS — M48062 Spinal stenosis, lumbar region with neurogenic claudication: Secondary | ICD-10-CM | POA: Diagnosis not present

## 2024-04-02 DIAGNOSIS — I5022 Chronic systolic (congestive) heart failure: Secondary | ICD-10-CM | POA: Diagnosis not present

## 2024-04-04 DIAGNOSIS — M48062 Spinal stenosis, lumbar region with neurogenic claudication: Secondary | ICD-10-CM | POA: Diagnosis not present

## 2024-04-05 DIAGNOSIS — M48062 Spinal stenosis, lumbar region with neurogenic claudication: Secondary | ICD-10-CM | POA: Diagnosis not present

## 2024-04-11 DIAGNOSIS — I10 Essential (primary) hypertension: Secondary | ICD-10-CM | POA: Diagnosis not present

## 2024-04-11 DIAGNOSIS — B029 Zoster without complications: Secondary | ICD-10-CM | POA: Diagnosis not present

## 2024-04-11 DIAGNOSIS — I5032 Chronic diastolic (congestive) heart failure: Secondary | ICD-10-CM | POA: Diagnosis not present

## 2024-04-11 DIAGNOSIS — Z299 Encounter for prophylactic measures, unspecified: Secondary | ICD-10-CM | POA: Diagnosis not present

## 2024-04-12 ENCOUNTER — Other Ambulatory Visit: Payer: Self-pay | Admitting: Cardiology

## 2024-04-12 DIAGNOSIS — I480 Paroxysmal atrial fibrillation: Secondary | ICD-10-CM

## 2024-04-12 NOTE — Telephone Encounter (Signed)
 Prescription refill request for Eliquis  received. Indication: afib  Last office visit: [place], 06/21/2023 Scr: 1.1, 08/03/2023 Age: 73 yo  Weight: 104.8 kg   Refill sent.

## 2024-04-16 DIAGNOSIS — M79605 Pain in left leg: Secondary | ICD-10-CM | POA: Diagnosis not present

## 2024-04-16 DIAGNOSIS — Z299 Encounter for prophylactic measures, unspecified: Secondary | ICD-10-CM | POA: Diagnosis not present

## 2024-04-16 DIAGNOSIS — I10 Essential (primary) hypertension: Secondary | ICD-10-CM | POA: Diagnosis not present

## 2024-04-16 DIAGNOSIS — M545 Low back pain, unspecified: Secondary | ICD-10-CM | POA: Diagnosis not present

## 2024-04-21 DIAGNOSIS — R0602 Shortness of breath: Secondary | ICD-10-CM | POA: Diagnosis not present

## 2024-04-21 DIAGNOSIS — M5432 Sciatica, left side: Secondary | ICD-10-CM | POA: Diagnosis not present

## 2024-04-21 DIAGNOSIS — Z79899 Other long term (current) drug therapy: Secondary | ICD-10-CM | POA: Diagnosis not present

## 2024-04-21 DIAGNOSIS — Z7901 Long term (current) use of anticoagulants: Secondary | ICD-10-CM | POA: Diagnosis not present

## 2024-04-21 DIAGNOSIS — M25552 Pain in left hip: Secondary | ICD-10-CM | POA: Diagnosis not present

## 2024-04-21 DIAGNOSIS — E119 Type 2 diabetes mellitus without complications: Secondary | ICD-10-CM | POA: Diagnosis not present

## 2024-04-21 DIAGNOSIS — J449 Chronic obstructive pulmonary disease, unspecified: Secondary | ICD-10-CM | POA: Diagnosis not present

## 2024-04-21 DIAGNOSIS — Z7951 Long term (current) use of inhaled steroids: Secondary | ICD-10-CM | POA: Diagnosis not present

## 2024-04-21 DIAGNOSIS — Z7984 Long term (current) use of oral hypoglycemic drugs: Secondary | ICD-10-CM | POA: Diagnosis not present

## 2024-04-21 DIAGNOSIS — I10 Essential (primary) hypertension: Secondary | ICD-10-CM | POA: Diagnosis not present

## 2024-04-21 DIAGNOSIS — M47816 Spondylosis without myelopathy or radiculopathy, lumbar region: Secondary | ICD-10-CM | POA: Diagnosis not present

## 2024-05-02 DIAGNOSIS — I10 Essential (primary) hypertension: Secondary | ICD-10-CM | POA: Diagnosis not present

## 2024-05-02 DIAGNOSIS — I5022 Chronic systolic (congestive) heart failure: Secondary | ICD-10-CM | POA: Diagnosis not present

## 2024-05-02 DIAGNOSIS — M5416 Radiculopathy, lumbar region: Secondary | ICD-10-CM | POA: Diagnosis not present

## 2024-05-02 DIAGNOSIS — J9611 Chronic respiratory failure with hypoxia: Secondary | ICD-10-CM | POA: Diagnosis not present

## 2024-05-02 DIAGNOSIS — Z299 Encounter for prophylactic measures, unspecified: Secondary | ICD-10-CM | POA: Diagnosis not present

## 2024-05-02 DIAGNOSIS — J449 Chronic obstructive pulmonary disease, unspecified: Secondary | ICD-10-CM | POA: Diagnosis not present

## 2024-05-02 DIAGNOSIS — I4891 Unspecified atrial fibrillation: Secondary | ICD-10-CM | POA: Diagnosis not present

## 2024-05-02 DIAGNOSIS — J441 Chronic obstructive pulmonary disease with (acute) exacerbation: Secondary | ICD-10-CM | POA: Diagnosis not present

## 2024-05-10 DIAGNOSIS — S233XXA Sprain of ligaments of thoracic spine, initial encounter: Secondary | ICD-10-CM | POA: Diagnosis not present

## 2024-05-10 DIAGNOSIS — S338XXA Sprain of other parts of lumbar spine and pelvis, initial encounter: Secondary | ICD-10-CM | POA: Diagnosis not present

## 2024-05-15 DIAGNOSIS — M48062 Spinal stenosis, lumbar region with neurogenic claudication: Secondary | ICD-10-CM | POA: Diagnosis not present

## 2024-05-16 DIAGNOSIS — S338XXA Sprain of other parts of lumbar spine and pelvis, initial encounter: Secondary | ICD-10-CM | POA: Diagnosis not present

## 2024-05-16 DIAGNOSIS — S233XXA Sprain of ligaments of thoracic spine, initial encounter: Secondary | ICD-10-CM | POA: Diagnosis not present

## 2024-05-24 DIAGNOSIS — S338XXA Sprain of other parts of lumbar spine and pelvis, initial encounter: Secondary | ICD-10-CM | POA: Diagnosis not present

## 2024-05-24 DIAGNOSIS — E1165 Type 2 diabetes mellitus with hyperglycemia: Secondary | ICD-10-CM | POA: Diagnosis not present

## 2024-05-24 DIAGNOSIS — S233XXA Sprain of ligaments of thoracic spine, initial encounter: Secondary | ICD-10-CM | POA: Diagnosis not present

## 2024-05-24 DIAGNOSIS — H269 Unspecified cataract: Secondary | ICD-10-CM | POA: Diagnosis not present

## 2024-05-24 DIAGNOSIS — I10 Essential (primary) hypertension: Secondary | ICD-10-CM | POA: Diagnosis not present

## 2024-05-24 DIAGNOSIS — Z299 Encounter for prophylactic measures, unspecified: Secondary | ICD-10-CM | POA: Diagnosis not present

## 2024-05-24 DIAGNOSIS — I4891 Unspecified atrial fibrillation: Secondary | ICD-10-CM | POA: Diagnosis not present

## 2024-05-24 DIAGNOSIS — I152 Hypertension secondary to endocrine disorders: Secondary | ICD-10-CM | POA: Diagnosis not present

## 2024-05-31 DIAGNOSIS — S338XXA Sprain of other parts of lumbar spine and pelvis, initial encounter: Secondary | ICD-10-CM | POA: Diagnosis not present

## 2024-05-31 DIAGNOSIS — S233XXA Sprain of ligaments of thoracic spine, initial encounter: Secondary | ICD-10-CM | POA: Diagnosis not present

## 2024-06-02 DIAGNOSIS — I5022 Chronic systolic (congestive) heart failure: Secondary | ICD-10-CM | POA: Diagnosis not present

## 2024-06-02 DIAGNOSIS — J441 Chronic obstructive pulmonary disease with (acute) exacerbation: Secondary | ICD-10-CM | POA: Diagnosis not present

## 2024-06-04 DIAGNOSIS — S233XXA Sprain of ligaments of thoracic spine, initial encounter: Secondary | ICD-10-CM | POA: Diagnosis not present

## 2024-06-04 DIAGNOSIS — S338XXA Sprain of other parts of lumbar spine and pelvis, initial encounter: Secondary | ICD-10-CM | POA: Diagnosis not present

## 2024-06-13 DIAGNOSIS — S338XXA Sprain of other parts of lumbar spine and pelvis, initial encounter: Secondary | ICD-10-CM | POA: Diagnosis not present

## 2024-06-13 DIAGNOSIS — S233XXA Sprain of ligaments of thoracic spine, initial encounter: Secondary | ICD-10-CM | POA: Diagnosis not present

## 2024-06-14 DIAGNOSIS — M48062 Spinal stenosis, lumbar region with neurogenic claudication: Secondary | ICD-10-CM | POA: Diagnosis not present

## 2024-06-27 DIAGNOSIS — S233XXA Sprain of ligaments of thoracic spine, initial encounter: Secondary | ICD-10-CM | POA: Diagnosis not present

## 2024-06-27 DIAGNOSIS — S338XXA Sprain of other parts of lumbar spine and pelvis, initial encounter: Secondary | ICD-10-CM | POA: Diagnosis not present

## 2024-07-02 DIAGNOSIS — I5022 Chronic systolic (congestive) heart failure: Secondary | ICD-10-CM | POA: Diagnosis not present

## 2024-07-02 DIAGNOSIS — J441 Chronic obstructive pulmonary disease with (acute) exacerbation: Secondary | ICD-10-CM | POA: Diagnosis not present

## 2024-07-10 DIAGNOSIS — H2513 Age-related nuclear cataract, bilateral: Secondary | ICD-10-CM | POA: Diagnosis not present

## 2024-07-10 DIAGNOSIS — E119 Type 2 diabetes mellitus without complications: Secondary | ICD-10-CM | POA: Diagnosis not present

## 2024-07-11 DIAGNOSIS — S338XXA Sprain of other parts of lumbar spine and pelvis, initial encounter: Secondary | ICD-10-CM | POA: Diagnosis not present

## 2024-07-11 DIAGNOSIS — S233XXA Sprain of ligaments of thoracic spine, initial encounter: Secondary | ICD-10-CM | POA: Diagnosis not present

## 2024-07-16 DIAGNOSIS — I152 Hypertension secondary to endocrine disorders: Secondary | ICD-10-CM | POA: Diagnosis not present

## 2024-07-16 DIAGNOSIS — J9611 Chronic respiratory failure with hypoxia: Secondary | ICD-10-CM | POA: Diagnosis not present

## 2024-07-16 DIAGNOSIS — J440 Chronic obstructive pulmonary disease with acute lower respiratory infection: Secondary | ICD-10-CM | POA: Diagnosis not present

## 2024-07-16 DIAGNOSIS — Z299 Encounter for prophylactic measures, unspecified: Secondary | ICD-10-CM | POA: Diagnosis not present

## 2024-07-16 DIAGNOSIS — E1159 Type 2 diabetes mellitus with other circulatory complications: Secondary | ICD-10-CM | POA: Diagnosis not present

## 2024-07-21 ENCOUNTER — Other Ambulatory Visit: Payer: Self-pay | Admitting: Cardiology

## 2024-07-23 ENCOUNTER — Other Ambulatory Visit: Payer: Self-pay | Admitting: Cardiology

## 2024-07-24 ENCOUNTER — Other Ambulatory Visit: Payer: Self-pay | Admitting: Cardiology

## 2024-08-02 DIAGNOSIS — J441 Chronic obstructive pulmonary disease with (acute) exacerbation: Secondary | ICD-10-CM | POA: Diagnosis not present

## 2024-08-02 DIAGNOSIS — I5022 Chronic systolic (congestive) heart failure: Secondary | ICD-10-CM | POA: Diagnosis not present

## 2024-08-07 DIAGNOSIS — R5383 Other fatigue: Secondary | ICD-10-CM | POA: Diagnosis not present

## 2024-08-07 DIAGNOSIS — Z0000 Encounter for general adult medical examination without abnormal findings: Secondary | ICD-10-CM | POA: Diagnosis not present

## 2024-08-07 DIAGNOSIS — Z299 Encounter for prophylactic measures, unspecified: Secondary | ICD-10-CM | POA: Diagnosis not present

## 2024-08-07 DIAGNOSIS — Z79899 Other long term (current) drug therapy: Secondary | ICD-10-CM | POA: Diagnosis not present

## 2024-08-07 DIAGNOSIS — E7800 Pure hypercholesterolemia, unspecified: Secondary | ICD-10-CM | POA: Diagnosis not present

## 2024-08-07 DIAGNOSIS — I10 Essential (primary) hypertension: Secondary | ICD-10-CM | POA: Diagnosis not present

## 2024-08-08 DIAGNOSIS — R5383 Other fatigue: Secondary | ICD-10-CM | POA: Diagnosis not present

## 2024-08-08 DIAGNOSIS — S233XXA Sprain of ligaments of thoracic spine, initial encounter: Secondary | ICD-10-CM | POA: Diagnosis not present

## 2024-08-08 DIAGNOSIS — Z79899 Other long term (current) drug therapy: Secondary | ICD-10-CM | POA: Diagnosis not present

## 2024-08-08 DIAGNOSIS — S338XXA Sprain of other parts of lumbar spine and pelvis, initial encounter: Secondary | ICD-10-CM | POA: Diagnosis not present

## 2024-08-13 DIAGNOSIS — H25813 Combined forms of age-related cataract, bilateral: Secondary | ICD-10-CM | POA: Diagnosis not present

## 2024-08-13 DIAGNOSIS — E119 Type 2 diabetes mellitus without complications: Secondary | ICD-10-CM | POA: Diagnosis not present

## 2024-08-15 DIAGNOSIS — S338XXA Sprain of other parts of lumbar spine and pelvis, initial encounter: Secondary | ICD-10-CM | POA: Diagnosis not present

## 2024-08-15 DIAGNOSIS — S233XXA Sprain of ligaments of thoracic spine, initial encounter: Secondary | ICD-10-CM | POA: Diagnosis not present

## 2024-08-22 DIAGNOSIS — S338XXA Sprain of other parts of lumbar spine and pelvis, initial encounter: Secondary | ICD-10-CM | POA: Diagnosis not present

## 2024-08-22 DIAGNOSIS — S233XXA Sprain of ligaments of thoracic spine, initial encounter: Secondary | ICD-10-CM | POA: Diagnosis not present

## 2024-08-24 DIAGNOSIS — H25812 Combined forms of age-related cataract, left eye: Secondary | ICD-10-CM | POA: Diagnosis not present

## 2024-08-29 DIAGNOSIS — S233XXA Sprain of ligaments of thoracic spine, initial encounter: Secondary | ICD-10-CM | POA: Diagnosis not present

## 2024-08-29 DIAGNOSIS — S338XXA Sprain of other parts of lumbar spine and pelvis, initial encounter: Secondary | ICD-10-CM | POA: Diagnosis not present

## 2024-09-02 DIAGNOSIS — J441 Chronic obstructive pulmonary disease with (acute) exacerbation: Secondary | ICD-10-CM | POA: Diagnosis not present

## 2024-09-02 DIAGNOSIS — I5022 Chronic systolic (congestive) heart failure: Secondary | ICD-10-CM | POA: Diagnosis not present

## 2024-09-05 DIAGNOSIS — H2511 Age-related nuclear cataract, right eye: Secondary | ICD-10-CM | POA: Diagnosis not present

## 2024-09-07 DIAGNOSIS — H25811 Combined forms of age-related cataract, right eye: Secondary | ICD-10-CM | POA: Diagnosis not present

## 2024-09-19 DIAGNOSIS — S338XXA Sprain of other parts of lumbar spine and pelvis, initial encounter: Secondary | ICD-10-CM | POA: Diagnosis not present

## 2024-09-19 DIAGNOSIS — S233XXA Sprain of ligaments of thoracic spine, initial encounter: Secondary | ICD-10-CM | POA: Diagnosis not present

## 2024-09-26 DIAGNOSIS — S233XXA Sprain of ligaments of thoracic spine, initial encounter: Secondary | ICD-10-CM | POA: Diagnosis not present

## 2024-09-26 DIAGNOSIS — S338XXA Sprain of other parts of lumbar spine and pelvis, initial encounter: Secondary | ICD-10-CM | POA: Diagnosis not present

## 2024-10-02 DIAGNOSIS — J441 Chronic obstructive pulmonary disease with (acute) exacerbation: Secondary | ICD-10-CM | POA: Diagnosis not present

## 2024-10-02 DIAGNOSIS — I5022 Chronic systolic (congestive) heart failure: Secondary | ICD-10-CM | POA: Diagnosis not present

## 2024-11-05 NOTE — Progress Notes (Signed)
 Pamela Soto                                          MRN: [id number]   11/05/2024   The [facility] Quality Team Specialist reviewed this patient medical record for the purposes of chart review for care gap closure. The following were reviewed: chart review for care gap closure-kidney health evaluation for diabetes:eGFR  and uACR.    [facility]BCI Quality Team
